# Patient Record
Sex: Female | Born: 1960 | Race: White | Hispanic: No | Marital: Married
Health system: Midwestern US, Community
[De-identification: ages and names within clinical notes are randomized; demographics above are authoritative.]

## PROBLEM LIST (undated history)

## (undated) ENCOUNTER — Ambulatory Visit (HOSPITAL_BASED_OUTPATIENT_CLINIC_OR_DEPARTMENT_OTHER)

---
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
## 1989-09-04 DIAGNOSIS — G35 Multiple sclerosis: Secondary | ICD-10-CM

## 1989-09-04 HISTORY — DX: Multiple sclerosis: G35

## 1997-12-03 ENCOUNTER — Encounter: Admission: RE | Admit: 1997-12-03 | Discharge: 1998-03-03 | Payer: Self-pay | Admitting: Neurosurgery

## 1998-06-21 ENCOUNTER — Other Ambulatory Visit: Admission: RE | Admit: 1998-06-21 | Discharge: 1998-06-21 | Payer: Self-pay | Admitting: Obstetrics and Gynecology

## 1999-06-26 ENCOUNTER — Encounter: Payer: Self-pay | Admitting: Neurosurgery

## 1999-06-26 ENCOUNTER — Ambulatory Visit (HOSPITAL_COMMUNITY): Admission: RE | Admit: 1999-06-26 | Discharge: 1999-06-26 | Payer: Self-pay | Admitting: Neurosurgery

## 2000-01-08 ENCOUNTER — Encounter: Payer: Self-pay | Admitting: Neurosurgery

## 2000-01-08 ENCOUNTER — Ambulatory Visit (HOSPITAL_COMMUNITY): Admission: RE | Admit: 2000-01-08 | Discharge: 2000-01-08 | Payer: Self-pay | Admitting: Neurosurgery

## 2000-01-24 ENCOUNTER — Encounter: Payer: Self-pay | Admitting: Neurosurgery

## 2000-01-26 ENCOUNTER — Encounter: Payer: Self-pay | Admitting: Neurosurgery

## 2000-01-26 ENCOUNTER — Inpatient Hospital Stay (HOSPITAL_COMMUNITY): Admission: RE | Admit: 2000-01-26 | Discharge: 2000-01-28 | Payer: Self-pay | Admitting: Neurosurgery

## 2000-02-02 ENCOUNTER — Encounter: Admission: RE | Admit: 2000-02-02 | Discharge: 2000-02-02 | Payer: Self-pay | Admitting: Neurosurgery

## 2000-02-02 ENCOUNTER — Encounter: Payer: Self-pay | Admitting: Neurosurgery

## 2000-03-21 ENCOUNTER — Encounter: Payer: Self-pay | Admitting: Neurosurgery

## 2000-03-21 ENCOUNTER — Encounter: Admission: RE | Admit: 2000-03-21 | Discharge: 2000-03-21 | Payer: Self-pay | Admitting: Neurosurgery

## 2000-06-06 ENCOUNTER — Encounter: Admission: RE | Admit: 2000-06-06 | Discharge: 2000-06-06 | Payer: Self-pay | Admitting: Neurosurgery

## 2000-06-06 ENCOUNTER — Encounter: Payer: Self-pay | Admitting: Neurosurgery

## 2000-09-27 ENCOUNTER — Other Ambulatory Visit: Admission: RE | Admit: 2000-09-27 | Discharge: 2000-09-27 | Payer: Self-pay | Admitting: Obstetrics and Gynecology

## 2000-10-03 ENCOUNTER — Encounter: Payer: Self-pay | Admitting: Obstetrics and Gynecology

## 2000-10-03 ENCOUNTER — Encounter: Admission: RE | Admit: 2000-10-03 | Discharge: 2000-10-03 | Payer: Self-pay | Admitting: Obstetrics and Gynecology

## 2001-12-05 ENCOUNTER — Other Ambulatory Visit: Admission: RE | Admit: 2001-12-05 | Discharge: 2001-12-05 | Payer: Self-pay | Admitting: Obstetrics and Gynecology

## 2003-07-29 ENCOUNTER — Other Ambulatory Visit: Admission: RE | Admit: 2003-07-29 | Discharge: 2003-07-29 | Payer: Self-pay | Admitting: Obstetrics and Gynecology

## 2004-09-06 ENCOUNTER — Other Ambulatory Visit: Admission: RE | Admit: 2004-09-06 | Discharge: 2004-09-06 | Payer: Self-pay | Admitting: Obstetrics and Gynecology

## 2006-05-17 ENCOUNTER — Encounter: Admission: RE | Admit: 2006-05-17 | Discharge: 2006-05-17 | Payer: Self-pay | Admitting: Obstetrics and Gynecology

## 2007-05-23 ENCOUNTER — Encounter: Admission: RE | Admit: 2007-05-23 | Discharge: 2007-05-23 | Payer: Self-pay | Admitting: Obstetrics and Gynecology

## 2008-09-29 ENCOUNTER — Encounter: Admission: RE | Admit: 2008-09-29 | Discharge: 2008-09-29 | Payer: Self-pay | Admitting: Obstetrics and Gynecology

## 2009-03-19 ENCOUNTER — Encounter: Admission: RE | Admit: 2009-03-19 | Discharge: 2009-03-19 | Payer: Self-pay | Admitting: Obstetrics and Gynecology

## 2011-01-20 NOTE — Op Note (Signed)
[place]. [facility]  Patient:    Christina Cantrell, Christina Cantrell                     MRN: [id number] Proc. Date: 01/26/00 Adm. Date:  [id number] Disc. 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  OPERATION:  Anterior cervical diskectomy at C5-6 and C6-7 levels with allograft bone grafting and intracervical plating  SURGEON:  Danae Orleans. Venetia Maxon, M.D.  ASSISTANT:  Alanson Aly. Roxan Hockey, M.D.  ANESTHESIA:  General endotracheal anesthesia.  ESTIMATED BLOOD LOSS:  100 cc.  COMPLICATIONS:  None.  DISPOSITION:  Recovery.  INDICATIONS:  Christina Cantrell is a 50 year old woman with neck pain and right upper extremity radiculopathy with herniated disk at C5-6 and C6-7 levels.  It was elected to take her to surgery for anterior cervical diskectomy and fusion.  DESCRIPTION OF PROCEDURE:  Ms. Wax is brought to the operating room. Following satisfactory and uncomplicated induction of general endotracheal anesthesia, placement of intravenous lines, she was placed in supine position on the operating table.  Her neck was placed in extension.  She was placed under 10 pounds of Holter traction.  Her anterior neck was then prepped and draped in the usual sterile fashion.  The area of planned incision was infiltrated with 0.25% Marcaine, 0.50% lidocaine, 1:200,000 epinephrine.  The incision was made in transverse fashion in one of the lower neck creases overlying the carotid tubercle from midline to the anterior border of sternocleidomastoid muscle.  This incision was carried sharply through platysmal layer.  Subplatysmal dissection was performed.  The blunt dissection was run along  the anterior border of the sternocleidomastoid muscle, keeping carotid sheath lateral and trachea and esophagus medial, exposing the anterior cervical spine.  Spinal needle was placed at what was felt to be the C5-6 level, and this was confirmed on intraoperative x-ray.  The longus colli muscles were then taken down from the anterior cervical spine from C5 through C7 levels bilaterally, and self-retaining shadow line retractor was placed with up-down retractor as well.  Disk spaces at C5-6 and C6-7 were incised with a #15 blade and disk material removed in a piecemeal fashion.  There was significant disk degeneration and ventral osteophytes at C5-6 and to a lesser degree at the C6-7 level.  Disk space spreaders were placed, and microscope was brought into the field initially at the C5-6 level.  The end plates of C5 and C6 were decorticated with Midas Rex drill and A2 burr.  The uncinate spurs were drilled down bilaterally.  The posterior longitudinal ligament was then incised with arachnoid knife, and disk material and ligamentous material were removed in piecemeal fashion using a variety of 2 and 3 mm Kerrison rongeurs. The C6 nerve roots were widely decompressed, particularly on the right side. There was evidence of disk material directly overlying the nerve root at this level, and a rent in the posterior longitudinal ligament was identified on exposure.  After decompressing the nerve roots bilaterally, hemostasis was assured with Gelfoam soaked in thrombin.  A iliac crest allograft wedge was then cut to a thickness of 7 mm and a depth of 13 mm  and was placed in the interspace and countersunk appropriately.  Attention was then turned to the C6-7 level where similar decompression was performed.  Again, the C7 nerve roots were widely decompressed, particularly on the right.  Uncinate spurs were removed initially with the Midas Rex drill and subsequently with Kerrison rongeurs.  Hemostasis  was again assured with Gelfoam soaked in thrombin, and a similarly sized iliac crest allograft bone wedge was placed.  Ventral osteophytes were removed.  The up-down retractors were then taken out.  Microscope was taken out of the field.  A 45 mm Lantus anterior cervical plate was sized, found to fit appropriately overlying the anterior cervical spine and was affixed to the anterior cervical spine with two 13 x 4 mm fixed angle screws at C5, at C6, and at C7.  All screws had excellent purchase.  Locking mechanisms were engaged.  Final x-ray confirmed positioning of bone graft and anterior cervical plate at the Z6-X0 level.  It was elected not to pull down on the patients shoulders to visualize the C6-7 level because of intrinsic shoulder pathology.  The wound was copiously irrigated with bacitracin and saline.  Platysmal areas were approximated with 3-0 Vicryl interrupted inverted sutures.  Skin edges were reapproximated with running 4-0 Vicryl subcuticular stitch.  The wound was dressed with Benzoin and Steri-Strips and Telfa tape.  The patient was placed in a collar and extubated in the operating room and taken to the recovery room in stable and satisfactory condition having tolerated the operation well.  Counts were correct at the end of the case. DD:  01/26/00 TD:  01/30/00 Job: [id number]

## 2011-01-20 NOTE — Discharge Summary (Signed)
[place]. [facility]  Patient:    Christina Cantrell, Christina Cantrell                     MRN: [id number] Adm. Date:  [id number] Disc. 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  HISTORY OF PRESENT ILLNESS AND HOSPITAL COURSE:  Christina Cantrell is a 50 year old woman with cervical spondylosis and herniated disk at C5-6 and c67 levels with right C6 and C7 radiculopathies.  It was elected to take her to surgery for anterior cervical diskectomy and fusion.  This was done on Jan 26, 2000.  She underwent uncomplicated surgery.  She did well postoperatively. Was having some persistent neck pain and required parenteral narcotics.  Was started on OxyContin for pain control.  Was done well on Jan 28, 2000, and was discharged home in stable and satisfactory condition, having tolerated the operation and hospitalization well.  DISCHARGE MEDICATIONS: 1. OxyContin 20 mg b.i.d. 2. Percocet 5/325 1 every four hours as needed for pain. 3. Valium 5 mg up to every six hours as needed for spasm.  DISCHARGE INSTRUCTIONS:  No lifting or pulling with her arms, bending, twisting, driving.  Wear a collar.  FOLLOW-UP:  With Dr. Venetia Maxon in two weeks.  Call Tuesday for an appointment with lateral C-spine x-rays. DD:  01/28/00 TD:  01/30/00 Job: [id number]

## 2011-01-20 NOTE — H&P (Signed)
[place]. [facility]  Patient:    Christina Cantrell, Christina Cantrell                     MRN: [id number] Adm. Date:  [id number] Attending:  Josie Saunders Dictator:   Danae Orleans. Venetia Maxon, M.D.                         History and Physical  REASON FOR ADMISSION:  Herniated cervical discs, C5-6 and C6-7 levels with right C6 and C7 radiculopathies.  HISTORY OF PRESENT ILLNESS:  Christina Cantrell is an established patient of mine who has previously undergone lumbar surgery.  She underwent lumbar surgery with RAY cages in 05/15/97.  She was stable from that, but began to complain in the fall of 2000 of significant right upper extremity pain.  She was found to have a herniated disc at C5-6, and a foraminal disc herniation at C6-7 on an MRI on the right.  She also had an MRI of her shoulder and was found to have some tendinopathy.  Dr. Cleophas Dunker evaluated her, and she underwent extensive physical therapy.  She continued to complain of neck pain and arm pain, and had a positive Spurlings maneuver to the right.  Multiple efforts at conservative management have been made without significant relief.  She tried home traction, and continued to have significant neck and right upper extremity pain and numbness.  It was elected to repeat her MRI this May, and this was of poor technical quality, but nonetheless demonstrated persistent disc herniations at C5-6 and C6-7 levels.  Based on her protracted neck and right upper extremity pain with biceps and triceps weakness it was elected to take her to surgery for anterior cervical diskectomy and fusion.  I discussed at length with Mr. and Christina Cantrell the planned surgical procedure which was planned for 01/26/00.  This will consist of anterior cervical diskectomy and fusion at C5-6 and C6-7 levels with allograft bone grafting and anterior cervical plating.  I went over the diagnostic studies with them in detail, and reviewed surgical models, and also  discussed the exact nature of the surgical procedure, the tentative risk potential, benefits, and typical operative and postoperative course.  I discussed the risks of surgery, which include, but are not limited to, risks of anesthesia, blood loss, infection, injury to various neck structures, including trachea and esophagus which could cause either temporary or permanent swallowing difficulties, and also the potential of perforation of the esophagus which might require operative intervention, larynx, recurrent laryngeal nerve, which could cause either temporary or permanent vocal cord paralysis resulting in either temporary or permanent voice changes, injury to cervical nerve roots which could cause either temporary or permanent arm pain, numbness, and/or weakness.  There is a small chance of injury to the spinal cord which could cause paralysis.  There is also the potential for malplacement of instrumentation, fusion failure, need for repeat surgery, degenerative disease at levels in the neck, failure to relieve her neck pain, worsening of pain.  I discussed that the patient will lose some mobility in her neck with this surgery.  It is typical to stay in the hospital overnight after the operation.  Typically, she will not be able to drive for two weeks after surgery.  Will come back to see me two weeks after surgery with lateral C-spine x-ray and for monthly visit up to three months after surgery.  She is to wear a collar after  the surgery.  PAST MEDICAL HISTORY: 1. A clinical diagnosis of MS. 2. 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  She has    been evaluated by ophthalmologist and also by Dr. Noreene Filbert from neurology. 3. Two lumbar spine surgeries.  CURRENT MEDICATIONS: 1. Amitriptyline 25 mg q.h.s. 2. Skelaxin 400 mg q.6h. p.r.n. 3. Hydrocodone/APAP 10/650 one q.6h. p.r.n. pain.  ALLERGIES:  No known drug allergies.  IMPRESSION AND RECOMMENDATIONS:  As  above.  Christina Cantrell is a 50 year old woman with right C6-7 radiculopathy, and herniated discs at C5-6 and C6-7 levels.  It was elected to take her to surgery for anterior cervical diskectomy and fusion. DD:  01/26/00 TD:  01/26/00 Job: [id number]

## 2011-10-18 ENCOUNTER — Other Ambulatory Visit: Payer: Self-pay | Admitting: Obstetrics and Gynecology

## 2011-10-18 DIAGNOSIS — N63 Unspecified lump in unspecified breast: Secondary | ICD-10-CM

## 2011-10-19 ENCOUNTER — Other Ambulatory Visit: Payer: Self-pay

## 2011-10-25 ENCOUNTER — Ambulatory Visit
Admission: RE | Admit: 2011-10-25 | Discharge: 2011-10-25 | Disposition: A | Discharge status: Home / Self Care | Payer: BC Managed Care – PPO | Source: Ambulatory Visit | Attending: Obstetrics and Gynecology | Admitting: Obstetrics and Gynecology

## 2011-10-25 DIAGNOSIS — N63 Unspecified lump in unspecified breast: Secondary | ICD-10-CM

## 2012-08-23 ENCOUNTER — Other Ambulatory Visit: Payer: Self-pay | Admitting: Obstetrics and Gynecology

## 2012-08-23 DIAGNOSIS — N6452 Nipple discharge: Secondary | ICD-10-CM

## 2012-08-23 DIAGNOSIS — N63 Unspecified lump in unspecified breast: Secondary | ICD-10-CM

## 2012-09-03 ENCOUNTER — Ambulatory Visit
Admission: RE | Admit: 2012-09-03 | Discharge: 2012-09-03 | Disposition: A | Discharge status: Home / Self Care | Payer: BC Managed Care – PPO | Source: Ambulatory Visit | Attending: Obstetrics and Gynecology | Admitting: Obstetrics and Gynecology

## 2012-09-03 DIAGNOSIS — N6452 Nipple discharge: Secondary | ICD-10-CM

## 2012-09-03 DIAGNOSIS — N63 Unspecified lump in unspecified breast: Secondary | ICD-10-CM

## 2013-06-04 DIAGNOSIS — M5136 Other intervertebral disc degeneration, lumbar region: Secondary | ICD-10-CM | POA: Insufficient documentation

## 2013-06-04 DIAGNOSIS — M546 Pain in thoracic spine: Secondary | ICD-10-CM

## 2013-06-04 DIAGNOSIS — G8929 Other chronic pain: Secondary | ICD-10-CM

## 2013-06-04 DIAGNOSIS — G35 Multiple sclerosis: Secondary | ICD-10-CM | POA: Insufficient documentation

## 2013-06-04 DIAGNOSIS — M51369 Other intervertebral disc degeneration, lumbar region without mention of lumbar back pain or lower extremity pain: Secondary | ICD-10-CM

## 2013-06-04 DIAGNOSIS — M545 Low back pain, unspecified: Secondary | ICD-10-CM | POA: Insufficient documentation

## 2013-06-04 HISTORY — DX: Other chronic pain: G89.29

## 2013-06-04 HISTORY — DX: Pain in thoracic spine: M54.6

## 2013-06-04 HISTORY — DX: Other intervertebral disc degeneration, lumbar region: M51.36

## 2013-06-04 HISTORY — DX: Other intervertebral disc degeneration, lumbar region without mention of lumbar back pain or lower extremity pain: M51.369

## 2014-09-18 DIAGNOSIS — J0100 Acute maxillary sinusitis, unspecified: Secondary | ICD-10-CM | POA: Diagnosis not present

## 2014-09-24 DIAGNOSIS — J45901 Unspecified asthma with (acute) exacerbation: Secondary | ICD-10-CM | POA: Diagnosis not present

## 2014-09-24 DIAGNOSIS — J019 Acute sinusitis, unspecified: Secondary | ICD-10-CM | POA: Diagnosis not present

## 2014-11-09 DIAGNOSIS — R5382 Chronic fatigue, unspecified: Secondary | ICD-10-CM | POA: Diagnosis not present

## 2014-11-09 DIAGNOSIS — R202 Paresthesia of skin: Secondary | ICD-10-CM | POA: Diagnosis not present

## 2014-11-12 DIAGNOSIS — M542 Cervicalgia: Secondary | ICD-10-CM | POA: Diagnosis not present

## 2014-11-12 DIAGNOSIS — M5136 Other intervertebral disc degeneration, lumbar region: Secondary | ICD-10-CM | POA: Diagnosis not present

## 2014-11-12 DIAGNOSIS — G35 Multiple sclerosis: Secondary | ICD-10-CM | POA: Diagnosis not present

## 2014-11-12 DIAGNOSIS — M545 Low back pain: Secondary | ICD-10-CM | POA: Diagnosis not present

## 2014-12-22 DIAGNOSIS — R072 Precordial pain: Secondary | ICD-10-CM | POA: Diagnosis not present

## 2014-12-22 DIAGNOSIS — R0602 Shortness of breath: Secondary | ICD-10-CM | POA: Diagnosis not present

## 2014-12-22 DIAGNOSIS — R079 Chest pain, unspecified: Secondary | ICD-10-CM | POA: Diagnosis not present

## 2014-12-31 DIAGNOSIS — R935 Abnormal findings on diagnostic imaging of other abdominal regions, including retroperitoneum: Secondary | ICD-10-CM | POA: Diagnosis not present

## 2014-12-31 DIAGNOSIS — I712 Thoracic aortic aneurysm, without rupture: Secondary | ICD-10-CM | POA: Diagnosis not present

## 2015-01-26 DIAGNOSIS — F17200 Nicotine dependence, unspecified, uncomplicated: Secondary | ICD-10-CM | POA: Diagnosis not present

## 2015-01-26 DIAGNOSIS — G35 Multiple sclerosis: Secondary | ICD-10-CM | POA: Diagnosis not present

## 2015-01-26 DIAGNOSIS — R0789 Other chest pain: Secondary | ICD-10-CM | POA: Diagnosis not present

## 2015-01-26 DIAGNOSIS — Z13220 Encounter for screening for lipoid disorders: Secondary | ICD-10-CM | POA: Diagnosis not present

## 2015-01-26 DIAGNOSIS — R0600 Dyspnea, unspecified: Secondary | ICD-10-CM | POA: Diagnosis not present

## 2015-02-04 DIAGNOSIS — M542 Cervicalgia: Secondary | ICD-10-CM | POA: Diagnosis not present

## 2015-02-04 DIAGNOSIS — M545 Low back pain: Secondary | ICD-10-CM | POA: Diagnosis not present

## 2015-02-04 DIAGNOSIS — G35 Multiple sclerosis: Secondary | ICD-10-CM | POA: Diagnosis not present

## 2015-02-04 DIAGNOSIS — M5136 Other intervertebral disc degeneration, lumbar region: Secondary | ICD-10-CM | POA: Diagnosis not present

## 2015-04-27 DIAGNOSIS — M545 Low back pain: Secondary | ICD-10-CM | POA: Diagnosis not present

## 2015-04-27 DIAGNOSIS — M542 Cervicalgia: Secondary | ICD-10-CM | POA: Diagnosis not present

## 2015-04-27 DIAGNOSIS — G35 Multiple sclerosis: Secondary | ICD-10-CM | POA: Diagnosis not present

## 2015-05-20 DIAGNOSIS — M7989 Other specified soft tissue disorders: Secondary | ICD-10-CM | POA: Diagnosis not present

## 2015-05-20 DIAGNOSIS — M25572 Pain in left ankle and joints of left foot: Secondary | ICD-10-CM | POA: Diagnosis not present

## 2015-05-20 DIAGNOSIS — M79609 Pain in unspecified limb: Secondary | ICD-10-CM | POA: Diagnosis not present

## 2015-05-20 DIAGNOSIS — M79672 Pain in left foot: Secondary | ICD-10-CM | POA: Diagnosis not present

## 2015-06-28 DIAGNOSIS — Z01419 Encounter for gynecological examination (general) (routine) without abnormal findings: Secondary | ICD-10-CM | POA: Diagnosis not present

## 2015-06-28 DIAGNOSIS — Z124 Encounter for screening for malignant neoplasm of cervix: Secondary | ICD-10-CM | POA: Diagnosis not present

## 2015-06-28 DIAGNOSIS — Z1231 Encounter for screening mammogram for malignant neoplasm of breast: Secondary | ICD-10-CM | POA: Diagnosis not present

## 2015-07-27 DIAGNOSIS — M542 Cervicalgia: Secondary | ICD-10-CM | POA: Diagnosis not present

## 2015-07-27 DIAGNOSIS — G35 Multiple sclerosis: Secondary | ICD-10-CM | POA: Diagnosis not present

## 2015-07-27 DIAGNOSIS — M5136 Other intervertebral disc degeneration, lumbar region: Secondary | ICD-10-CM | POA: Diagnosis not present

## 2015-07-27 DIAGNOSIS — M545 Low back pain: Secondary | ICD-10-CM | POA: Diagnosis not present

## 2015-08-12 DIAGNOSIS — H547 Unspecified visual loss: Secondary | ICD-10-CM | POA: Diagnosis not present

## 2015-08-12 DIAGNOSIS — H5203 Hypermetropia, bilateral: Secondary | ICD-10-CM | POA: Diagnosis not present

## 2015-08-12 DIAGNOSIS — H04123 Dry eye syndrome of bilateral lacrimal glands: Secondary | ICD-10-CM | POA: Diagnosis not present

## 2015-08-12 DIAGNOSIS — G35 Multiple sclerosis: Secondary | ICD-10-CM | POA: Diagnosis not present

## 2015-09-14 DIAGNOSIS — G35 Multiple sclerosis: Secondary | ICD-10-CM | POA: Diagnosis not present

## 2015-10-25 DIAGNOSIS — K921 Melena: Secondary | ICD-10-CM | POA: Diagnosis not present

## 2015-10-25 DIAGNOSIS — R140 Abdominal distension (gaseous): Secondary | ICD-10-CM | POA: Diagnosis not present

## 2015-10-25 DIAGNOSIS — K5900 Constipation, unspecified: Secondary | ICD-10-CM | POA: Diagnosis not present

## 2015-10-28 DIAGNOSIS — M542 Cervicalgia: Secondary | ICD-10-CM | POA: Diagnosis not present

## 2015-10-28 DIAGNOSIS — M5136 Other intervertebral disc degeneration, lumbar region: Secondary | ICD-10-CM | POA: Diagnosis not present

## 2015-10-28 DIAGNOSIS — M545 Low back pain: Secondary | ICD-10-CM | POA: Diagnosis not present

## 2015-10-28 DIAGNOSIS — G35 Multiple sclerosis: Secondary | ICD-10-CM | POA: Diagnosis not present

## 2015-11-02 DIAGNOSIS — Z79899 Other long term (current) drug therapy: Secondary | ICD-10-CM | POA: Diagnosis not present

## 2015-11-02 DIAGNOSIS — G35 Multiple sclerosis: Secondary | ICD-10-CM | POA: Diagnosis not present

## 2015-11-02 DIAGNOSIS — Z1211 Encounter for screening for malignant neoplasm of colon: Secondary | ICD-10-CM | POA: Diagnosis not present

## 2015-11-02 DIAGNOSIS — K5730 Diverticulosis of large intestine without perforation or abscess without bleeding: Secondary | ICD-10-CM | POA: Diagnosis not present

## 2015-11-02 DIAGNOSIS — Z87891 Personal history of nicotine dependence: Secondary | ICD-10-CM | POA: Diagnosis not present

## 2015-11-02 DIAGNOSIS — G4730 Sleep apnea, unspecified: Secondary | ICD-10-CM | POA: Diagnosis not present

## 2015-12-09 DIAGNOSIS — K5900 Constipation, unspecified: Secondary | ICD-10-CM | POA: Diagnosis not present

## 2015-12-09 DIAGNOSIS — R1032 Left lower quadrant pain: Secondary | ICD-10-CM | POA: Diagnosis not present

## 2015-12-09 DIAGNOSIS — K5730 Diverticulosis of large intestine without perforation or abscess without bleeding: Secondary | ICD-10-CM | POA: Diagnosis not present

## 2016-01-03 LAB — HM COLONOSCOPY

## 2016-01-05 DIAGNOSIS — L219 Seborrheic dermatitis, unspecified: Secondary | ICD-10-CM | POA: Diagnosis not present

## 2016-01-05 DIAGNOSIS — L299 Pruritus, unspecified: Secondary | ICD-10-CM | POA: Diagnosis not present

## 2016-01-05 DIAGNOSIS — L304 Erythema intertrigo: Secondary | ICD-10-CM | POA: Diagnosis not present

## 2016-01-10 DIAGNOSIS — N23 Unspecified renal colic: Secondary | ICD-10-CM | POA: Diagnosis not present

## 2016-01-10 DIAGNOSIS — M545 Low back pain: Secondary | ICD-10-CM | POA: Diagnosis not present

## 2016-01-11 DIAGNOSIS — M545 Low back pain: Secondary | ICD-10-CM | POA: Diagnosis not present

## 2016-01-11 DIAGNOSIS — G35 Multiple sclerosis: Secondary | ICD-10-CM | POA: Diagnosis not present

## 2016-01-11 DIAGNOSIS — M542 Cervicalgia: Secondary | ICD-10-CM | POA: Diagnosis not present

## 2016-01-11 DIAGNOSIS — M5136 Other intervertebral disc degeneration, lumbar region: Secondary | ICD-10-CM | POA: Diagnosis not present

## 2016-01-12 DIAGNOSIS — R109 Unspecified abdominal pain: Secondary | ICD-10-CM | POA: Diagnosis not present

## 2016-01-12 DIAGNOSIS — M545 Low back pain: Secondary | ICD-10-CM | POA: Diagnosis not present

## 2016-01-12 DIAGNOSIS — N200 Calculus of kidney: Secondary | ICD-10-CM | POA: Diagnosis not present

## 2016-01-12 DIAGNOSIS — Z87442 Personal history of urinary calculi: Secondary | ICD-10-CM | POA: Diagnosis not present

## 2016-01-26 DIAGNOSIS — R1032 Left lower quadrant pain: Secondary | ICD-10-CM | POA: Diagnosis not present

## 2016-01-26 DIAGNOSIS — N200 Calculus of kidney: Secondary | ICD-10-CM | POA: Diagnosis not present

## 2016-01-26 DIAGNOSIS — R3129 Other microscopic hematuria: Secondary | ICD-10-CM | POA: Diagnosis not present

## 2016-04-13 DIAGNOSIS — M545 Low back pain: Secondary | ICD-10-CM | POA: Diagnosis not present

## 2016-04-13 DIAGNOSIS — M5136 Other intervertebral disc degeneration, lumbar region: Secondary | ICD-10-CM | POA: Diagnosis not present

## 2016-04-13 DIAGNOSIS — G35 Multiple sclerosis: Secondary | ICD-10-CM | POA: Diagnosis not present

## 2016-04-13 DIAGNOSIS — M542 Cervicalgia: Secondary | ICD-10-CM | POA: Diagnosis not present

## 2016-05-10 DIAGNOSIS — Z23 Encounter for immunization: Secondary | ICD-10-CM | POA: Diagnosis not present

## 2016-05-10 DIAGNOSIS — R1084 Generalized abdominal pain: Secondary | ICD-10-CM | POA: Diagnosis not present

## 2016-05-10 DIAGNOSIS — R079 Chest pain, unspecified: Secondary | ICD-10-CM | POA: Diagnosis not present

## 2016-05-10 DIAGNOSIS — J449 Chronic obstructive pulmonary disease, unspecified: Secondary | ICD-10-CM | POA: Diagnosis not present

## 2016-05-10 DIAGNOSIS — R0789 Other chest pain: Secondary | ICD-10-CM | POA: Diagnosis not present

## 2016-05-10 DIAGNOSIS — E782 Mixed hyperlipidemia: Secondary | ICD-10-CM | POA: Diagnosis not present

## 2016-05-10 DIAGNOSIS — R5383 Other fatigue: Secondary | ICD-10-CM | POA: Diagnosis not present

## 2016-06-12 ENCOUNTER — Other Ambulatory Visit: Payer: Self-pay | Admitting: Pharmacist

## 2016-06-12 NOTE — Patient Outreach (Signed)
Outreach call to Christina Cantrell regarding her request for follow up from the [facility] Medication Adherence Campaign. Called and spoke with patient. HIPAA identifiers verified and verbal consent received.  Christina Cantrell reports that her atorvastatin dose has been increased to 20 mg daily. Reports that she takes her medication as directed. Reports that a couple of months ago, she had been having trouble with missing doses. Reports that she would fall asleep before taking her medication. However, reports that her husband has started helping to remember her medication at nighttime. Denies any missed doses since he started helping her to manage this. Denies barriers to adherence such as side effects or cost.   Patient reports that she has no medication questions or concerns at this time.  Harlow Asa, PharmD Clinical Pharmacist [facility] [phone]

## 2016-07-05 DIAGNOSIS — M542 Cervicalgia: Secondary | ICD-10-CM | POA: Diagnosis not present

## 2016-07-05 DIAGNOSIS — M545 Low back pain: Secondary | ICD-10-CM | POA: Diagnosis not present

## 2016-07-05 DIAGNOSIS — G35 Multiple sclerosis: Secondary | ICD-10-CM | POA: Diagnosis not present

## 2016-09-26 DIAGNOSIS — R030 Elevated blood-pressure reading, without diagnosis of hypertension: Secondary | ICD-10-CM | POA: Diagnosis not present

## 2016-09-26 DIAGNOSIS — M542 Cervicalgia: Secondary | ICD-10-CM | POA: Diagnosis not present

## 2016-09-26 DIAGNOSIS — M545 Low back pain: Secondary | ICD-10-CM | POA: Diagnosis not present

## 2016-09-26 DIAGNOSIS — G35 Multiple sclerosis: Secondary | ICD-10-CM | POA: Diagnosis not present

## 2017-01-31 DIAGNOSIS — R030 Elevated blood-pressure reading, without diagnosis of hypertension: Secondary | ICD-10-CM | POA: Diagnosis not present

## 2017-01-31 DIAGNOSIS — M545 Low back pain: Secondary | ICD-10-CM | POA: Diagnosis not present

## 2017-01-31 DIAGNOSIS — G35 Multiple sclerosis: Secondary | ICD-10-CM | POA: Diagnosis not present

## 2017-01-31 DIAGNOSIS — M542 Cervicalgia: Secondary | ICD-10-CM | POA: Diagnosis not present

## 2017-02-01 DIAGNOSIS — R51 Headache: Secondary | ICD-10-CM | POA: Diagnosis not present

## 2017-02-01 DIAGNOSIS — J449 Chronic obstructive pulmonary disease, unspecified: Secondary | ICD-10-CM | POA: Diagnosis not present

## 2017-02-01 DIAGNOSIS — R0602 Shortness of breath: Secondary | ICD-10-CM | POA: Diagnosis not present

## 2017-02-01 DIAGNOSIS — G4730 Sleep apnea, unspecified: Secondary | ICD-10-CM | POA: Diagnosis not present

## 2017-02-01 DIAGNOSIS — H469 Unspecified optic neuritis: Secondary | ICD-10-CM | POA: Diagnosis not present

## 2017-02-01 DIAGNOSIS — H538 Other visual disturbances: Secondary | ICD-10-CM | POA: Diagnosis not present

## 2017-02-01 DIAGNOSIS — G35 Multiple sclerosis: Secondary | ICD-10-CM | POA: Diagnosis not present

## 2017-02-01 DIAGNOSIS — I10 Essential (primary) hypertension: Secondary | ICD-10-CM | POA: Diagnosis not present

## 2017-02-01 DIAGNOSIS — R0600 Dyspnea, unspecified: Secondary | ICD-10-CM | POA: Diagnosis not present

## 2017-02-26 DIAGNOSIS — H04123 Dry eye syndrome of bilateral lacrimal glands: Secondary | ICD-10-CM | POA: Diagnosis not present

## 2017-02-26 DIAGNOSIS — G35 Multiple sclerosis: Secondary | ICD-10-CM | POA: Diagnosis not present

## 2017-02-26 DIAGNOSIS — Z8669 Personal history of other diseases of the nervous system and sense organs: Secondary | ICD-10-CM | POA: Diagnosis not present

## 2017-02-26 DIAGNOSIS — H2513 Age-related nuclear cataract, bilateral: Secondary | ICD-10-CM | POA: Diagnosis not present

## 2017-02-26 DIAGNOSIS — H524 Presbyopia: Secondary | ICD-10-CM | POA: Diagnosis not present

## 2017-03-22 DIAGNOSIS — L700 Acne vulgaris: Secondary | ICD-10-CM | POA: Diagnosis not present

## 2017-03-22 DIAGNOSIS — L300 Nummular dermatitis: Secondary | ICD-10-CM | POA: Diagnosis not present

## 2017-05-01 DIAGNOSIS — M545 Low back pain: Secondary | ICD-10-CM | POA: Diagnosis not present

## 2017-05-01 DIAGNOSIS — G35 Multiple sclerosis: Secondary | ICD-10-CM | POA: Diagnosis not present

## 2017-05-01 DIAGNOSIS — M542 Cervicalgia: Secondary | ICD-10-CM | POA: Diagnosis not present

## 2017-07-23 DIAGNOSIS — M545 Low back pain: Secondary | ICD-10-CM | POA: Diagnosis not present

## 2017-07-23 DIAGNOSIS — M542 Cervicalgia: Secondary | ICD-10-CM | POA: Diagnosis not present

## 2017-07-23 DIAGNOSIS — G35 Multiple sclerosis: Secondary | ICD-10-CM | POA: Diagnosis not present

## 2017-10-17 DIAGNOSIS — M542 Cervicalgia: Secondary | ICD-10-CM | POA: Diagnosis not present

## 2017-10-17 DIAGNOSIS — G35 Multiple sclerosis: Secondary | ICD-10-CM | POA: Diagnosis not present

## 2017-10-17 DIAGNOSIS — R030 Elevated blood-pressure reading, without diagnosis of hypertension: Secondary | ICD-10-CM | POA: Diagnosis not present

## 2017-10-17 DIAGNOSIS — M545 Low back pain: Secondary | ICD-10-CM | POA: Diagnosis not present

## 2017-11-12 DIAGNOSIS — M792 Neuralgia and neuritis, unspecified: Secondary | ICD-10-CM | POA: Insufficient documentation

## 2017-11-12 DIAGNOSIS — G35 Multiple sclerosis: Secondary | ICD-10-CM | POA: Diagnosis not present

## 2017-11-12 DIAGNOSIS — M545 Low back pain: Secondary | ICD-10-CM | POA: Diagnosis not present

## 2017-11-12 HISTORY — DX: Neuralgia and neuritis, unspecified: M79.2

## 2017-11-19 DIAGNOSIS — M545 Low back pain: Secondary | ICD-10-CM | POA: Diagnosis not present

## 2017-11-19 DIAGNOSIS — G35 Multiple sclerosis: Secondary | ICD-10-CM | POA: Diagnosis not present

## 2017-11-19 DIAGNOSIS — M792 Neuralgia and neuritis, unspecified: Secondary | ICD-10-CM | POA: Diagnosis not present

## 2017-11-26 DIAGNOSIS — M545 Low back pain: Secondary | ICD-10-CM | POA: Diagnosis not present

## 2017-11-26 DIAGNOSIS — M792 Neuralgia and neuritis, unspecified: Secondary | ICD-10-CM | POA: Diagnosis not present

## 2018-01-16 DIAGNOSIS — M545 Low back pain: Secondary | ICD-10-CM | POA: Diagnosis not present

## 2018-01-16 DIAGNOSIS — M542 Cervicalgia: Secondary | ICD-10-CM | POA: Diagnosis not present

## 2018-01-16 DIAGNOSIS — M792 Neuralgia and neuritis, unspecified: Secondary | ICD-10-CM | POA: Diagnosis not present

## 2018-01-16 DIAGNOSIS — G35 Multiple sclerosis: Secondary | ICD-10-CM | POA: Diagnosis not present

## 2018-01-21 DIAGNOSIS — H2513 Age-related nuclear cataract, bilateral: Secondary | ICD-10-CM | POA: Diagnosis not present

## 2018-02-18 DIAGNOSIS — H52211 Irregular astigmatism, right eye: Secondary | ICD-10-CM | POA: Diagnosis not present

## 2018-02-18 DIAGNOSIS — H2511 Age-related nuclear cataract, right eye: Secondary | ICD-10-CM | POA: Diagnosis not present

## 2018-02-18 DIAGNOSIS — Z01818 Encounter for other preprocedural examination: Secondary | ICD-10-CM | POA: Diagnosis not present

## 2018-02-22 DIAGNOSIS — H25811 Combined forms of age-related cataract, right eye: Secondary | ICD-10-CM | POA: Diagnosis not present

## 2018-02-22 DIAGNOSIS — H2511 Age-related nuclear cataract, right eye: Secondary | ICD-10-CM | POA: Diagnosis not present

## 2018-03-22 DIAGNOSIS — H25812 Combined forms of age-related cataract, left eye: Secondary | ICD-10-CM | POA: Diagnosis not present

## 2018-03-22 DIAGNOSIS — H2512 Age-related nuclear cataract, left eye: Secondary | ICD-10-CM | POA: Diagnosis not present

## 2018-04-17 DIAGNOSIS — M542 Cervicalgia: Secondary | ICD-10-CM | POA: Diagnosis not present

## 2018-04-17 DIAGNOSIS — M545 Low back pain: Secondary | ICD-10-CM | POA: Diagnosis not present

## 2018-04-17 DIAGNOSIS — M792 Neuralgia and neuritis, unspecified: Secondary | ICD-10-CM | POA: Diagnosis not present

## 2018-04-17 DIAGNOSIS — G35 Multiple sclerosis: Secondary | ICD-10-CM | POA: Diagnosis not present

## 2018-05-01 DIAGNOSIS — B009 Herpesviral infection, unspecified: Secondary | ICD-10-CM | POA: Diagnosis not present

## 2018-05-01 DIAGNOSIS — L209 Atopic dermatitis, unspecified: Secondary | ICD-10-CM | POA: Diagnosis not present

## 2018-05-30 DIAGNOSIS — Z6822 Body mass index (BMI) 22.0-22.9, adult: Secondary | ICD-10-CM | POA: Diagnosis not present

## 2018-05-30 DIAGNOSIS — G4731 Primary central sleep apnea: Secondary | ICD-10-CM

## 2018-05-30 DIAGNOSIS — Z124 Encounter for screening for malignant neoplasm of cervix: Secondary | ICD-10-CM | POA: Diagnosis not present

## 2018-05-30 DIAGNOSIS — Z1231 Encounter for screening mammogram for malignant neoplasm of breast: Secondary | ICD-10-CM | POA: Diagnosis not present

## 2018-05-30 DIAGNOSIS — R5383 Other fatigue: Secondary | ICD-10-CM | POA: Diagnosis not present

## 2018-05-30 DIAGNOSIS — Z01419 Encounter for gynecological examination (general) (routine) without abnormal findings: Secondary | ICD-10-CM | POA: Diagnosis not present

## 2018-05-30 HISTORY — DX: Primary central sleep apnea: G47.31

## 2018-07-05 DIAGNOSIS — M542 Cervicalgia: Secondary | ICD-10-CM | POA: Diagnosis not present

## 2018-07-05 DIAGNOSIS — G35 Multiple sclerosis: Secondary | ICD-10-CM | POA: Diagnosis not present

## 2018-07-05 DIAGNOSIS — M792 Neuralgia and neuritis, unspecified: Secondary | ICD-10-CM | POA: Diagnosis not present

## 2018-07-05 DIAGNOSIS — M545 Low back pain: Secondary | ICD-10-CM | POA: Diagnosis not present

## 2018-07-30 ENCOUNTER — Inpatient Hospital Stay
Admit: 2018-07-30 | Discharge: 2018-07-30 | Disposition: A | Discharge status: Home / Self Care | Payer: MEDICARE | Attending: Emergency Medicine

## 2018-07-30 ENCOUNTER — Emergency Department: Admit: 2018-07-30 | Payer: MEDICARE | Primary: Physician Assistant

## 2018-07-30 DIAGNOSIS — W109XXA Fall (on) (from) unspecified stairs and steps, initial encounter: Secondary | ICD-10-CM | POA: Diagnosis not present

## 2018-07-30 DIAGNOSIS — S82041A Displaced comminuted fracture of right patella, initial encounter for closed fracture: Secondary | ICD-10-CM | POA: Diagnosis not present

## 2018-07-30 DIAGNOSIS — R51 Headache: Secondary | ICD-10-CM | POA: Diagnosis not present

## 2018-07-30 DIAGNOSIS — Z79899 Other long term (current) drug therapy: Secondary | ICD-10-CM | POA: Diagnosis not present

## 2018-07-30 DIAGNOSIS — F17200 Nicotine dependence, unspecified, uncomplicated: Secondary | ICD-10-CM | POA: Diagnosis not present

## 2018-07-30 DIAGNOSIS — S0083XA Contusion of other part of head, initial encounter: Secondary | ICD-10-CM | POA: Diagnosis not present

## 2018-07-30 DIAGNOSIS — G44309 Post-traumatic headache, unspecified, not intractable: Secondary | ICD-10-CM | POA: Diagnosis not present

## 2018-07-30 MED ORDER — NAPROXEN 500 MG TAB
500 mg | ORAL_TABLET | Freq: Two times a day (BID) | ORAL | 0 refills | Status: AC
Start: 2018-07-30 — End: ?

## 2018-07-30 MED ORDER — KETOROLAC TROMETHAMINE 30 MG/ML INJECTION
30 mg/mL (1 mL) | INTRAMUSCULAR | Status: AC
Start: 2018-07-30 — End: 2018-07-30
  Administered 2018-07-30: 07:00:00 via INTRAMUSCULAR

## 2018-07-30 MED ORDER — OXYCODONE-ACETAMINOPHEN 5 MG-325 MG TAB
5-325 mg | ORAL_TABLET | Freq: Three times a day (TID) | ORAL | 0 refills | Status: AC | PRN
Start: 2018-07-30 — End: 2018-08-04

## 2018-07-30 MED ORDER — OXYCODONE-ACETAMINOPHEN 5 MG-325 MG TAB
5-325 mg | ORAL | Status: AC
Start: 2018-07-30 — End: 2018-07-30
  Administered 2018-07-30: 07:00:00 via ORAL

## 2018-07-30 MED ORDER — WHEELCHAIR
0 refills | Status: AC
Start: 2018-07-30 — End: ?

## 2018-07-30 MED ORDER — WALKER
Freq: Once | 0 refills | Status: AC
Start: 2018-07-30 — End: 2018-07-30

## 2018-07-30 MED FILL — OXYCODONE-ACETAMINOPHEN 5 MG-325 MG TAB: 5-325 mg | ORAL | Qty: 2

## 2018-07-30 MED FILL — KETOROLAC TROMETHAMINE 30 MG/ML INJECTION: 30 mg/mL (1 mL) | INTRAMUSCULAR | Qty: 1

## 2018-07-30 NOTE — ED Notes (Signed)
Patient has been instructed that they have been given percocet* which contains opioids, benzodiazepines, or other sedating drugs. Patient is aware that they  will need to refrain from driving or operating heavy machinery after taking this medication.  Patient also instructed that they need to avoid drinking alcohol and using other products containing opioids, benzodiazepines, or other sedating drugs.  Patient verbalized understanding. Pt has ride home at bedside.

## 2018-07-30 NOTE — ED Notes (Addendum)
Pt presents to ED via wheelchair complaining of fall 1.5 hours PTA in ED.  Pt reports she was pulled by her daughter's dog and fell down 3 steps. Pt reports she landed on her knees and face and felt her nose "crunch." Pt noted to have bruising and swelling to forehead, nose, and right knee. Pt also noted to have abrasions to forehead and bilateral knees. Pt denies she lost consciousness or vomited.  Pt is alert and oriented x 4, RR even and unlabored. Assessment completed and pt updated on plan of care.      Emergency Department Nursing Plan of Care       The Nursing Plan of Care is developed from the Nursing assessment and Emergency Department Attending provider initial evaluation.  The plan of care may be reviewed in the ???ED Provider note???.    The Plan of Care was developed with the following considerations:   Patient / Family readiness to learn indicated by:verbalized understanding  Persons(s) to be included in education: patient  Barriers to Learning/Limitations:No    Signed     Alaina Williams V    07/30/2018   1:18 AM

## 2018-07-30 NOTE — ED Provider Notes (Addendum)
EMERGENCY DEPARTMENT HISTORY AND PHYSICAL EXAM      Please note that this dictation was completed with Dragon, the computer voice recognition software. Quite often unanticipated grammatical, syntax, homophones, and other interpretive errors are inadvertently transcribed by the computer software. Please disregard these errors and any errors that have escaped final proofreading. Thank you.    Date: 07/30/2018  Patient Name: Jamie Boyle  Patient Age and Sex: 57 y.o. female    History of Presenting Illness     Chief Complaint   Patient presents with   ??? Fall       History Provided By: Patient    HPI: Jamie Boyle, 57 y.o. female with past medical history as documented below presents to the ED with c/o of acute onset of severe facial pain and right knee pain after fall about 90 mins PTA. She was pulled by her daugther's dog and fell down several steps. She denies LOC. She reports bruising to forehead and nose. She took no medications PTA. Pt denies any other alleviating or exacerbating factors. Additionally, pt specifically denies any recent fever, chills, headache, nausea, vomiting, abdominal pain, CP, SOB, lightheadedness, dizziness, numbness, weakness, BLE swelling, heart palpitations, urinary sxs, diarrhea, constipation, melena, hematochezia, cough, or congestion.     There are no other complaints, changes or physical findings at this time.     PCP: Cheryll Dessertrawford, Kristen Cox, PA    Past History   Past Medical History:  History reviewed. No pertinent past medical history.    Past Surgical History:  History reviewed. No pertinent surgical history.    Family History:  History reviewed. No pertinent family history.    Social History:  Social History     Tobacco Use   ??? Smoking status: Current Some Day Smoker     Years: 2.00   ??? Smokeless tobacco: Former NeurosurgeonUser   Substance Use Topics   ??? Alcohol use: Yes     Frequency: Never     Comment: occ   ??? Drug use: Never       Allergies:  No Known Allergies    Current Medications:   No current facility-administered medications on file prior to encounter.      Current Outpatient Medications on File Prior to Encounter   Medication Sig Dispense Refill   ??? fentaNYL (DURAGESIC) 100 mcg/hr PATCH 1 Patch by TransDERmal route every fourty-eight (48) hours.     ??? citalopram (CELEXA) 40 mg tablet Take 40 mg by mouth daily.     ??? oxyCODONE-acetaminophen (PERCOCET) 5-325 mg per tablet Take 1 Tab by mouth every four (4) hours as needed for Pain.     ??? cyclobenzaprine (FLEXERIL) 5 mg tablet Take 10 mg by mouth.     ??? Biotin 2,500 mcg cap Take  by mouth.         Review of Systems   Review of Systems   Constitutional: Negative.  Negative for chills and fever.   HENT: Negative.  Negative for congestion, facial swelling, rhinorrhea, sore throat, trouble swallowing and voice change.    Eyes: Negative.    Respiratory: Negative.  Negative for apnea, cough, chest tightness, shortness of breath and wheezing.    Cardiovascular: Negative.  Negative for chest pain, palpitations and leg swelling.   Gastrointestinal: Negative.  Negative for abdominal distention, abdominal pain, blood in stool, constipation, diarrhea, nausea and vomiting.   Endocrine: Negative.  Negative for cold intolerance, heat intolerance and polyuria.   Genitourinary: Negative.  Negative for difficulty urinating, dysuria, flank pain,  frequency, hematuria and urgency.   Musculoskeletal: Positive for arthralgias, joint swelling and myalgias. Negative for back pain, neck pain and neck stiffness.   Skin: Positive for wound. Negative for color change and rash.   Neurological: Positive for headaches. Negative for dizziness, syncope, facial asymmetry, speech difficulty, weakness, light-headedness and numbness.   Hematological: Bruises/bleeds easily.   Psychiatric/Behavioral: Negative.  Negative for confusion and self-injury. The patient is not nervous/anxious.        Physical Exam   Physical Exam  Vitals signs and nursing note reviewed.   Constitutional:        General: She is not in acute distress.     Appearance: She is well-developed. She is not diaphoretic.   HENT:      Head: Normocephalic.      Comments: Soft forehead hematoma, no laceration     Mouth/Throat:      Pharynx: No oropharyngeal exudate.   Eyes:      Conjunctiva/sclera: Conjunctivae normal.      Pupils: Pupils are equal, round, and reactive to light.   Neck:      Musculoskeletal: Normal range of motion.   Cardiovascular:      Rate and Rhythm: Normal rate and regular rhythm.      Heart sounds: Normal heart sounds. No murmur. No friction rub. No gallop.    Pulmonary:      Effort: Pulmonary effort is normal. No respiratory distress.      Breath sounds: Normal breath sounds. No wheezing or rales.   Chest:      Chest wall: No tenderness.   Abdominal:      General: Bowel sounds are normal. There is no distension.      Palpations: Abdomen is soft. There is no mass.      Tenderness: There is no tenderness. There is no guarding or rebound.   Musculoskeletal: Normal range of motion.         General: Swelling, tenderness (TTP over right knee, min effusion, NVI distally) and deformity present.   Skin:     General: Skin is warm.      Findings: No rash.   Neurological:      Mental Status: She is alert and oriented to person, place, and time.      Cranial Nerves: No cranial nerve deficit.      Motor: No abnormal muscle tone.      Coordination: Coordination normal.      Deep Tendon Reflexes: Reflexes normal.         Diagnostic Study Results     Labs -  No results found for this or any previous visit (from the past 24 hour(s)).    Radiologic Studies -   XR KNEE RT 3 V   Final Result   IMPRESSION:    1. Acute patellar fracture. Moderate joint effusion and marked prepatellar soft   tissue swelling.      2. Incidental nonaggressive bony lesion in the proximal tibia in keeping with an   enchondroma          CT MAXILLOFACIAL WO CONT   Final Result   IMPRESSION:    No acute abnormality.        CT Results  (Last 48 hours)                07/30/18 0215  CT MAXILLOFACIAL WO CONT Final result    Impression:  IMPRESSION:    No acute abnormality.       Narrative:  EXAM:  CT maxillofacial without contrast        INDICATION:  Facial pain after fall down steps with injury.       COMPARISON: None       TECHNIQUE: Helical CT of the facial bones with coronal and sagittal reformats.   Images reviewed in soft tissue and bone windows. CT dose reduction was achieved   through use of a standardized protocol tailored for this examination and   automatic exposure control for dose modulation.       CONTRAST: None.       FINDINGS:    There is no acute fracture. The temporomandibular joints are intact. The   paranasal sinuses and mastoid air cells are clear.       The facial soft tissues are unremarkable.       Limited intracranial images are unremarkable. The globes and orbits are   symmetric and within normal limits.               CXR Results  (Last 48 hours)    None          Medical Decision Making   I am the first provider for this patient.    I reviewed the vital signs, available nursing notes, past medical history, past surgical history, family history and social history.    Vital Signs-Reviewed the patient's vital signs.  Patient Vitals for the past 24 hrs:   Temp Pulse Resp BP SpO2   07/30/18 0108 98.2 ??F (36.8 ??C) 95 18 128/72 98 %     Pulse Oximetry Analysis - 98% on RA    Cardiac Monitor:   Rate: 95 bpm  Rhythm: Normal Sinus Rhythm      Records Reviewed: Nursing Notes, Old Medical Records, Previous electrocardiograms, Previous Radiology Studies and Previous Laboratory Studies    Provider Notes (Medical Decision Making):   Pt presents with acute right knee knee pain after injury. Neurovascularly intact distally. Given focal tenderness, query possible ligamentous injury however no gross instability. No tibial plateau tenderness. XR without frank fracture. Low suspicion for vascular injury with  dislocation-relocation. No ankle or hip pain. No back pain with low supicion for significant axial load. No systemic symptoms and nontoxic; given exam and history, low suspicion for septic arthritis, pyomyositis or necrotizing fascitis. No e/o compartment syndrome or DVT. Will recommend RICE therapy, pain control. Follow up with PMD and ortho as needed. Discussed return precautions; pt agrees to above plan.     Patient presents after fall with forehead/knee pain.  Will get imaging to further evaluate for fracture vs. Dislocation vs. Contusion.  Will treat with analgesics at this time and continue to monitor for changes in mentation.     I have discussed with the patient how to reduce likelihood of falling at home by removing throw rugs, loose wires or other objects from floor, installing hand-rails in bathrooms, tubs and halls, and leaving some lights on at night.    ED Course:   Initial assessment performed. The patients presenting problems have been discussed, and they are in agreement with the care plan formulated and outlined with them.  I have encouraged them to ask questions as they arise throughout their visit.    TOBACCO COUNSELING:   Upon evaluation, pt expressed that they are a current tobacco user. For approximately 10 minutes, pt has been counseled on the dangers of smoking and was encouraged to quit as soon as possible in order to decrease further risks to their health. Pt has conveyed  their understanding of the risks involved should they continue to use tobacco products.    ALCOHOL/SUBSTANCE ABUSE COUNSELING:  Upon evaluation, pt endorsed recent alcohol/illicit drug use. For approximately 15 minutes, pt has been counseled on the dangers of alcohol and illicit drug use on their health, and they were encouraged to quit as soon as possible in order to decrease further risks to their health. Pt has conveyed their understanding of the risks involved should they continue to use these products.     I reviewed our electronic medical record system for any past medical records that were available that may contribute to the patient's current condition, the nursing notes and vital signs from today's visit.  Marguerita Beards, MD    ED Orders Placed :  Orders Placed This Encounter   ??? APPLY KNEE IMMOBILIZER   ??? CT MAXILLOFACIAL WO CONT   ??? XR KNEE RT 3 V   ??? APPLY ICE TO SPECIFIED AREA   ??? APPLY ACE WRAP:SPECIFY ONE TIME STAT   ??? CRUTCHES WITH INSTRUCTIONS   ??? fentaNYL (DURAGESIC) 100 mcg/hr PATCH   ??? citalopram (CELEXA) 40 mg tablet   ??? oxyCODONE-acetaminophen (PERCOCET) 5-325 mg per tablet   ??? cyclobenzaprine (FLEXERIL) 5 mg tablet   ??? Biotin 2,500 mcg cap   ??? oxyCODONE-acetaminophen (PERCOCET) 5-325 mg per tablet 2 Tab   ??? ketorolac (TORADOL) injection 30 mg   ??? oxyCODONE-acetaminophen (PERCOCET) 5-325 mg per tablet   ??? naproxen (NAPROSYN) 500 mg tablet   ??? Walker misc   ??? Wheel Chair devi     ED Medications Administered:  Medications   oxyCODONE-acetaminophen (PERCOCET) 5-325 mg per tablet 2 Tab (2 Tabs Oral Given 07/30/18 0140)   ketorolac (TORADOL) injection 30 mg (30 mg IntraMUSCular Given 07/30/18 0140)     Procedure Note - Splint Placement:  Performed by: Jed Limerick, MD  Neurovascularly intact prior to tx.  An Orthoglass right knee immbolizer splint was placed on pt's right knee.  Joint was placed in extension.  Neurovascularly intact after tx. Pt also provided crutches with instructions a Rx for walker.  The procedure took 1-15 minutes, and pt tolerated well.       Progress Note:  Patient has been reassessed and reports feeling better and symptoms have improved significantly after ED treatment. Patient feels comfortable going home with close follow-up. Marybelle Killings final labs and imaging have been reviewed with her and available family and/or caregiver. They have been counseled regarding her diagnosis. She verbally conveys understanding and agreement of the signs, symptoms, diagnosis, treatment and prognosis  and additionally agrees to follow up as recommended with Dr. Okey Dupre, Mickey Farber, PA and/or specialist in 24 - 48 hours. She also agrees with the care-plan we created together and conveys that all of her questions have been answered.  I have also put together some discharge instructions for her that include: 1) educational information regarding their diagnosis, 2) how to care for their diagnosis at home, as well a 3) list of reasons why they would want to return to the ED prior to their follow-up appointment should the patient's condition change or symptoms worsen.    I have answered all questions to the patient's satisfaction. Strict return precautions given. She both understood and agreed with plan as discussed. Vital signs stable for discharge.     Disposition: DISCHARGE  The pt is ready for discharge. The pt's signs, symptoms, diagnosis, and discharge instructions have been discussed and pt has conveyed their understanding. The pt is  to follow up as recommended or return to ER should their symptoms worsen. Plan has been discussed and pt is in agreement.    PLAN:  1. Return precautions as discussed.    2.   Discharge Medication List as of 07/30/2018  2:30 AM      START taking these medications    Details   !! oxyCODONE-acetaminophen (PERCOCET) 5-325 mg per tablet Take 1 Tab by mouth every eight (8) hours as needed for Pain for up to 5 days. Max Daily Amount: 3 Tabs. Indications: pain, Print, Disp-10 Tab, R-0      naproxen (NAPROSYN) 500 mg tablet Take 1 Tab by mouth two (2) times daily (with meals)., Print, Disp-20 Tab, R-0      Walker misc 1 Units by Does Not Apply route once for 1 dose. Rolling walker, Print, Disp-1 Each, R-0       !! - Potential duplicate medications found. Please discuss with provider.      CONTINUE these medications which have NOT CHANGED    Details   fentaNYL (DURAGESIC) 100 mcg/hr PATCH 1 Patch by TransDERmal route every fourty-eight (48) hours., Historical Med       citalopram (CELEXA) 40 mg tablet Take 40 mg by mouth daily., Historical Med      !! oxyCODONE-acetaminophen (PERCOCET) 5-325 mg per tablet Take 1 Tab by mouth every four (4) hours as needed for Pain., Historical Med      cyclobenzaprine (FLEXERIL) 5 mg tablet Take 10 mg by mouth., Historical Med      Biotin 2,500 mcg cap Take  by mouth., Historical Med       !! - Potential duplicate medications found. Please discuss with provider.        3.   Follow-up Information     Follow up With Specialties Details Why Contact Info    Cheryll Dessert, PA Physician Assistant       [facility] EMERGENCY DEPT Emergency Medicine  As needed, If symptoms worsen [address] [place] [address]  [phone]    Job Founds, MD Orthopedic Surgery Schedule an appointment as soon as possible for a visit in 1 day  [address]  [address]  [place] [address]  [phone]            Return to ED if worse  Diagnosis     Clinical Impression:   1. Closed displaced comminuted fracture of right patella, initial encounter    2. Traumatic hematoma of forehead, initial encounter    3. Fall from ground level    4. Facial pain        Attestation:  I personally performed the services described in this documentation on this date 07/30/2018 for patient, Jamie Boyle.  Jed Limerick, MD      This note will not be viewable in MyChart.

## 2018-07-30 NOTE — ED Notes (Signed)
Discharge instructions were given to the patient by Alaina Williams V, RN.     The patient left the Emergency Department ambulatory, alert and oriented and in no acute distress with 3 prescriptions. The patient was encouraged to call or return to the ED for worsening issues or problems and was encouraged to schedule a follow up appointment for continuing care.     The patient verbalized understanding of discharge instructions and prescriptions, all questions were answered. The patient has no further concerns at this time.

## 2018-07-30 NOTE — ED Notes (Signed)
 Pt presents to ED via wheelchair complaining of fall 1.5 hours PTA in ED.  Pt reports she was pulled by her daughter's dog and fell down 3 steps. Pt reports she landed on her knees and face and felt her nose crunch. Pt noted to have bruising and swelling to forehead, nose, and right knee. Pt also noted to have abrasions to forehead and bilateral knees. Pt denies she lost consciousness or vomited.  Pt is alert and oriented x 4, RR even and unlabored. Assessment completed and pt updated on plan of care.      Emergency Department Nursing Plan of Care       The Nursing Plan of Care is developed from the Nursing assessment and Emergency Department Attending provider initial evaluation.  The plan of care may be reviewed in the "ED Provider note".    The Plan of Care was developed with the following considerations:   Patient / Family readiness to learn indicated ab:czmajopszi understanding  Persons(s) to be included in education: patient  Barriers to Learning/Limitations:No    Signed     Luster Trudy GAILS    07/30/2018   1:18 AM

## 2018-07-30 NOTE — ED Notes (Signed)
 Discharge instructions were given to the patient by Simone Curia, RN.     The patient left the Emergency Department ambulatory, alert and oriented and in no acute distress with 3 prescriptions. The patient was encouraged to call or return to the ED for worsening issues or problems and was encouraged to schedule a follow up appointment for continuing care.     The patient verbalized understanding of discharge instructions and prescriptions, all questions were answered. The patient has no further concerns at this time.

## 2018-07-30 NOTE — ED Provider Notes (Signed)
ED  Provider Notes by Jed Limerick, MD at 07/30/18 0254                Author: Jed Limerick, MD  Service: Emergency Medicine  Author Type: Physician       Filed: 08/08/18 1139  Date of Service: 07/30/18 0254  Status: Addendum          Editor: Jed Limerick, MD (Physician)          Related Notes: Original Note by Jed Limerick, MD (Physician) filed at 08/05/18 1952               EMERGENCY DEPARTMENT HISTORY AND PHYSICAL EXAM           Please note that this dictation was completed with Dragon, the computer voice recognition software. Quite often unanticipated grammatical, syntax, homophones, and other interpretive errors are  inadvertently transcribed by the computer software. Please disregard these errors and any errors that have escaped final proofreading. Thank you.      Date: 07/30/2018   Patient Name: Jamie Boyle   Patient Age and Sex: 57 y.o.  female        History of Presenting Illness          Chief Complaint       Patient presents with        ?  Fall           History Provided By: Patient      HPI: Jamie Boyle,  57 y.o. female with past medical history as documented below presents to the ED with c/o  of acute onset of severe facial pain and right knee pain after fall about 90 mins PTA. She was pulled by her daugther's dog and fell down several steps. She denies LOC. She reports bruising to forehead and nose. She took no medications PTA. Pt denies  any other alleviating or exacerbating factors. Additionally, pt specifically denies any recent fever, chills, headache, nausea, vomiting, abdominal pain, CP, SOB, lightheadedness, dizziness, numbness, weakness, BLE swelling, heart palpitations, urinary  sxs, diarrhea, constipation, melena, hematochezia, cough, or congestion.       There are no other complaints, changes or physical findings at this time.       PCP: Cheryll Dessert, PA        Past History     Past Medical History:   History reviewed. No pertinent past medical history.      Past Surgical  History:   History reviewed. No pertinent surgical history.      Family History:   History reviewed. No pertinent family history.      Social History:     Social History          Tobacco Use         ?  Smoking status:  Current Some Day Smoker              Years:  2.00         ?  Smokeless tobacco:  Former Network engineer Use Topics         ?  Alcohol use:  Yes              Frequency:  Never             Comment: occ         ?  Drug use:  Never           Allergies:   No Known Allergies  Current Medications:     No current facility-administered medications on file prior to encounter.           Current Outpatient Medications on File Prior to Encounter          Medication  Sig  Dispense  Refill           ?  fentaNYL (DURAGESIC) 100 mcg/hr PATCH  1 Patch by TransDERmal route every fourty-eight (48) hours.         ?  citalopram (CELEXA) 40 mg tablet  Take 40 mg by mouth daily.         ?  oxyCODONE-acetaminophen (PERCOCET) 5-325 mg per tablet  Take 1 Tab by mouth every four (4) hours as needed for Pain.         ?  cyclobenzaprine (FLEXERIL) 5 mg tablet  Take 10 mg by mouth.               ?  Biotin 2,500 mcg cap  Take  by mouth.                 Review of Systems     Review of Systems    Constitutional: Negative.  Negative for chills and fever.    HENT: Negative.  Negative for congestion, facial swelling, rhinorrhea, sore throat, trouble swallowing and voice change.     Eyes: Negative.     Respiratory: Negative.  Negative for apnea, cough, chest tightness, shortness of breath and wheezing.     Cardiovascular: Negative.  Negative for chest pain, palpitations and leg swelling.    Gastrointestinal: Negative.  Negative for abdominal distention, abdominal pain, blood in stool, constipation, diarrhea, nausea and vomiting.    Endocrine: Negative.  Negative for cold intolerance, heat intolerance and polyuria.    Genitourinary: Negative.  Negative for difficulty urinating, dysuria, flank pain, frequency, hematuria and  urgency.    Musculoskeletal: Positive for arthralgias, joint swelling  and myalgias. Negative for back pain, neck pain and neck stiffness.    Skin: Positive for wound. Negative for color change and rash.    Neurological: Positive for headaches. Negative for dizziness, syncope, facial asymmetry, speech difficulty, weakness, light-headedness and  numbness.    Hematological: Bruises/bleeds easily.    Psychiatric/Behavioral: Negative.  Negative for confusion and self-injury. The patient is not nervous/anxious.             Physical Exam     Physical Exam   Vitals signs and nursing note reviewed.   Constitutional:        General: She is not in acute distress.     Appearance: She is well-developed. She is not diaphoretic.    HENT:       Head: Normocephalic.      Comments: Soft forehead hematoma, no laceration     Mouth/Throat:      Pharynx: No oropharyngeal  exudate.   Eyes :       Conjunctiva/sclera: Conjunctivae normal.      Pupils: Pupils are equal, round, and reactive to light.    Neck:       Musculoskeletal: Normal range of motion.   Cardiovascular :       Rate and Rhythm: Normal rate and regular rhythm.      Heart sounds: Normal heart sounds. No murmur. No friction rub. No gallop.     Pulmonary:       Effort: Pulmonary effort is normal. No respiratory distress.      Breath sounds: Normal breath sounds. No wheezing  or rales.   Chest:       Chest wall: No tenderness.   Abdominal :      General: Bowel sounds are normal. There is no distension.      Palpations: Abdomen is soft. There is no mass.      Tenderness: There is no tenderness. There is no guarding or rebound.     Musculoskeletal: Normal range of motion.          General: Swelling, tenderness  (TTP over right knee, min effusion, NVI distally) and deformity  present.    Skin:      General: Skin is warm.      Findings: No rash.   Neurological :       Mental Status: She is alert and oriented to person, place, and time.      Cranial Nerves: No cranial nerve deficit.       Motor: No abnormal muscle tone.      Coordination: Coordination normal.      Deep Tendon Reflexes: Reflexes normal.              Diagnostic Study Results        Labs -   No results found for this or any previous visit (from the past 24 hour(s)).      Radiologic Studies -      XR KNEE RT 3 V       Final Result     IMPRESSION:      1. Acute patellar fracture. Moderate joint effusion and marked prepatellar soft     tissue swelling.          2. Incidental nonaggressive bony lesion in the proximal tibia in keeping with an     enchondroma                  CT MAXILLOFACIAL WO CONT       Final Result     IMPRESSION:      No acute abnormality.                 CT Results  (Last 48 hours)                                    07/30/18 0215    CT MAXILLOFACIAL WO CONT  Final result            Impression:    IMPRESSION:       No acute abnormality.                       Narrative:    EXAM:  CT maxillofacial without contrast              INDICATION:  Facial pain after fall down steps with injury.             COMPARISON: None             TECHNIQUE: Helical CT of the facial bones with coronal and sagittal reformats.      Images reviewed in soft tissue and bone windows. CT dose reduction was achieved      through use of a standardized protocol tailored for this examination and      automatic exposure control for dose modulation.             CONTRAST: None.  FINDINGS:       There is no acute fracture. The temporomandibular joints are intact. The      paranasal sinuses and mastoid air cells are clear.             The facial soft tissues are unremarkable.             Limited intracranial images are unremarkable. The globes and orbits are      symmetric and within normal limits.                                 CXR Results  (Last 48  hours)          None                    Medical Decision Making     I am the first provider for this patient.      I reviewed the vital signs, available nursing notes, past medical history, past  surgical history, family history and social history.      Vital Signs-Reviewed the patient's vital signs.   Patient Vitals for the past 24 hrs:            Temp  Pulse  Resp  BP  SpO2            07/30/18 0108  98.2 ??F (36.8 ??C)  95  18  128/72  98 %        Pulse Oximetry Analysis - 98% on RA      Cardiac Monitor:    Rate: 95 bpm   Rhythm: Normal Sinus Rhythm        Records Reviewed: Nursing Notes, Old Medical Records, Previous electrocardiograms, Previous Radiology Studies and Previous Laboratory Studies      Provider Notes (Medical Decision Making):    Pt presents with acute right knee knee pain after injury. Neurovascularly intact distally. Given focal tenderness, query possible ligamentous injury however no gross instability. No tibial plateau tenderness.  XR without frank fracture. Low suspicion for vascular injury with dislocation-relocation. No ankle or hip pain. No back pain with low supicion for significant axial load. No systemic symptoms and nontoxic; given exam and history, low suspicion for septic  arthritis, pyomyositis or necrotizing fascitis. No e/o compartment syndrome or DVT. Will recommend RICE therapy, pain control. Follow up with PMD and ortho as needed. Discussed return precautions; pt agrees to above plan.       Patient presents after fall with forehead/knee pain.  Will get imaging to further evaluate for fracture vs. Dislocation vs. Contusion.  Will treat with analgesics at this time and continue to monitor for changes in mentation.       I have discussed with the patient how to reduce likelihood of falling at home by removing throw rugs, loose wires or other objects from floor, installing hand-rails in bathrooms, tubs and halls, and leaving some lights on at night.      ED Course:    Initial assessment performed. The patients presenting problems have been discussed, and they are in agreement with the care plan formulated and outlined with them.  I have encouraged them to ask questions as they  arise throughout their visit.      TOBACCO COUNSELING:    Upon evaluation, pt expressed that they are a current tobacco user. For approximately 10 minutes, pt has been counseled on the dangers of smoking and was encouraged  to quit as soon as possible in order  to decrease further risks to their health. Pt has conveyed their understanding of the risks involved should they continue to use tobacco products.      ALCOHOL/SUBSTANCE ABUSE COUNSELING:   Upon evaluation, pt endorsed recent alcohol/illicit drug use. For approximately 15 minutes, pt has been counseled on the dangers of alcohol and illicit drug use on their health, and they were encouraged  to quit as soon as possible in order to decrease further risks to their health. Pt has conveyed their understanding of the risks involved should they continue to use these products.      I reviewed our electronic medical record system for any past medical records that were available that may contribute to the patient's current condition, the nursing notes and vital signs from  today's visit.  Marguerita Beards, MD      ED Orders Placed :     Orders Placed This Encounter        ?  APPLY KNEE IMMOBILIZER     ?  CT MAXILLOFACIAL WO CONT     ?  XR KNEE RT 3 V     ?  APPLY ICE TO SPECIFIED AREA     ?  APPLY ACE WRAP:SPECIFY ONE TIME STAT     ?  CRUTCHES WITH INSTRUCTIONS     ?  fentaNYL (DURAGESIC) 100 mcg/hr PATCH     ?  citalopram (CELEXA) 40 mg tablet     ?  oxyCODONE-acetaminophen (PERCOCET) 5-325 mg per tablet     ?  cyclobenzaprine (FLEXERIL) 5 mg tablet     ?  Biotin 2,500 mcg cap     ?  oxyCODONE-acetaminophen (PERCOCET) 5-325 mg per tablet 2 Tab     ?  ketorolac (TORADOL) injection 30 mg     ?  oxyCODONE-acetaminophen (PERCOCET) 5-325 mg per tablet     ?  naproxen (NAPROSYN) 500 mg tablet     ?  Walker misc        ?  Wheel Chair devi        ED Medications Administered:     Medications       oxyCODONE-acetaminophen (PERCOCET) 5-325 mg per tablet 2 Tab (2 Tabs Oral Given 07/30/18  0140)       ketorolac (TORADOL) injection 30 mg (30 mg IntraMUSCular Given 07/30/18 0140)        Procedure Note - Splint Placement:   Performed by: Jed Limerick, MD   Neurovascularly intact prior to tx.  An Orthoglass right knee immbolizer splint was placed on pt's right knee.  Joint was placed in extension.  Neurovascularly intact after tx. Pt also provided crutches with instructions a Rx for walker.   The procedure took 1-15 minutes, and pt tolerated well.          Progress Note:   Patient has been reassessed and reports feeling better and symptoms have improved significantly after ED treatment. Patient feels comfortable going home with close follow-up.  Marybelle Killings final labs and imaging have been reviewed with her and available family  and/or caregiver. They have been counseled regarding her diagnosis. She  verbally conveys understanding and agreement of the signs, symptoms, diagnosis, treatment and prognosis and additionally agrees to follow up as recommended with Dr. Okey Dupre, Mickey Farber, PA  and/or specialist in 24 - 48 hours. She also agrees with the care-plan we created together and conveys that all of  her questions have been answered.  I have also  put together some discharge instructions for her  that include: 1) educational information regarding their diagnosis, 2) how to care for their diagnosis at home, as well a 3) list of reasons why they would want to return to the ED prior to their follow-up appointment should the patient's condition change  or symptoms worsen.      I have answered all questions to the patient's satisfaction. Strict return precautions given. She both understood and agreed with plan as discussed. Vital signs stable for discharge.       Disposition: DISCHARGE   The pt is ready for discharge. The pt's signs, symptoms, diagnosis, and discharge instructions have been discussed and pt has conveyed their understanding. The pt is to follow up as recommended or return  to ER should  their symptoms worsen. Plan has been discussed and pt is in agreement.      PLAN:   1. Return precautions as discussed.      2.      Discharge Medication List as of 07/30/2018  2:30 AM              START taking these medications          Details        !! oxyCODONE-acetaminophen (PERCOCET) 5-325 mg per tablet  Take 1 Tab by mouth every eight (8) hours as needed for Pain for up to 5 days. Max Daily Amount: 3 Tabs. Indications: pain, Print, Disp-10 Tab, R-0               naproxen (NAPROSYN) 500 mg tablet  Take 1 Tab by mouth two (2) times daily (with meals)., Print, Disp-20 Tab, R-0               Walker misc  1 Units by Does Not Apply route once for 1 dose. Rolling walker, Print, Disp-1 Each, R-0               !! - Potential duplicate medications found. Please discuss with provider.              CONTINUE these medications which have NOT CHANGED          Details        fentaNYL (DURAGESIC) 100 mcg/hr PATCH  1 Patch by TransDERmal route every fourty-eight (48) hours., Historical Med               citalopram (CELEXA) 40 mg tablet  Take 40 mg by mouth daily., Historical Med               !! oxyCODONE-acetaminophen (PERCOCET) 5-325 mg per tablet  Take 1 Tab by mouth every four (4) hours as needed for Pain., Historical Med               cyclobenzaprine (FLEXERIL) 5 mg tablet  Take 10 mg by mouth., Historical Med               Biotin 2,500 mcg cap  Take  by mouth., Historical Med               !! - Potential duplicate medications found. Please discuss with provider.               3.      Follow-up Information               Follow up With  Specialties  Details  Why  Contact Info  Cheryll Dessert, PA  Physician Assistant                    [facility] EM[facility]  Emergency Medicine    As needed, If symptoms worsen  [address] [place] [address]   [phone]              Job Founds, MD  Orthopedic Surgery  Schedule an appointment as soon as possible for a visit in 1 day    [address]    [address]   [place] [address]   [phone]                   Return to ED if worse     Diagnosis        Clinical Impression:       1.  Closed displaced comminuted fracture of right patella, initial encounter      2.  Traumatic hematoma of forehead, initial encounter      3.  Fall from ground level         4.  Facial pain            Attestation:   I personally performed the services described in this documentation on this date 07/30/2018 for patient, Jamie Boyle.  Jed Limerick, MD           This note will not be viewable in MyChart.

## 2018-08-05 DIAGNOSIS — S82034A Nondisplaced transverse fracture of right patella, initial encounter for closed fracture: Secondary | ICD-10-CM | POA: Diagnosis not present

## 2018-08-05 DIAGNOSIS — S82001A Unspecified fracture of right patella, initial encounter for closed fracture: Secondary | ICD-10-CM | POA: Diagnosis not present

## 2018-08-05 DIAGNOSIS — Z23 Encounter for immunization: Secondary | ICD-10-CM | POA: Diagnosis not present

## 2018-08-16 DIAGNOSIS — S82034D Nondisplaced transverse fracture of right patella, subsequent encounter for closed fracture with routine healing: Secondary | ICD-10-CM | POA: Diagnosis not present

## 2018-08-23 DIAGNOSIS — S82034D Nondisplaced transverse fracture of right patella, subsequent encounter for closed fracture with routine healing: Secondary | ICD-10-CM | POA: Diagnosis not present

## 2018-09-13 DIAGNOSIS — S82034D Nondisplaced transverse fracture of right patella, subsequent encounter for closed fracture with routine healing: Secondary | ICD-10-CM | POA: Diagnosis not present

## 2018-09-17 DIAGNOSIS — M1990 Unspecified osteoarthritis, unspecified site: Secondary | ICD-10-CM

## 2018-09-17 DIAGNOSIS — H469 Unspecified optic neuritis: Secondary | ICD-10-CM

## 2018-09-17 DIAGNOSIS — M4800 Spinal stenosis, site unspecified: Secondary | ICD-10-CM | POA: Insufficient documentation

## 2018-09-17 HISTORY — DX: Unspecified osteoarthritis, unspecified site: M19.90

## 2018-09-17 HISTORY — DX: Unspecified optic neuritis: H46.9

## 2018-09-17 HISTORY — DX: Spinal stenosis, site unspecified: M48.00

## 2018-09-18 DIAGNOSIS — E2839 Other primary ovarian failure: Secondary | ICD-10-CM | POA: Diagnosis not present

## 2018-09-18 DIAGNOSIS — M818 Other osteoporosis without current pathological fracture: Secondary | ICD-10-CM | POA: Diagnosis not present

## 2018-09-18 DIAGNOSIS — M810 Age-related osteoporosis without current pathological fracture: Secondary | ICD-10-CM | POA: Diagnosis not present

## 2018-10-04 DIAGNOSIS — S82034D Nondisplaced transverse fracture of right patella, subsequent encounter for closed fracture with routine healing: Secondary | ICD-10-CM | POA: Diagnosis not present

## 2018-10-18 DIAGNOSIS — R030 Elevated blood-pressure reading, without diagnosis of hypertension: Secondary | ICD-10-CM | POA: Diagnosis not present

## 2018-10-18 DIAGNOSIS — M545 Low back pain: Secondary | ICD-10-CM | POA: Diagnosis not present

## 2018-10-18 DIAGNOSIS — G35 Multiple sclerosis: Secondary | ICD-10-CM | POA: Diagnosis not present

## 2018-10-18 DIAGNOSIS — M542 Cervicalgia: Secondary | ICD-10-CM | POA: Diagnosis not present

## 2018-10-22 DIAGNOSIS — S82034D Nondisplaced transverse fracture of right patella, subsequent encounter for closed fracture with routine healing: Secondary | ICD-10-CM | POA: Diagnosis not present

## 2018-10-22 DIAGNOSIS — X58XXXD Exposure to other specified factors, subsequent encounter: Secondary | ICD-10-CM | POA: Diagnosis not present

## 2018-10-22 DIAGNOSIS — M25561 Pain in right knee: Secondary | ICD-10-CM | POA: Diagnosis not present

## 2018-10-25 DIAGNOSIS — S82034D Nondisplaced transverse fracture of right patella, subsequent encounter for closed fracture with routine healing: Secondary | ICD-10-CM | POA: Diagnosis not present

## 2018-10-25 DIAGNOSIS — S83241A Other tear of medial meniscus, current injury, right knee, initial encounter: Secondary | ICD-10-CM | POA: Diagnosis not present

## 2018-10-25 DIAGNOSIS — M25561 Pain in right knee: Secondary | ICD-10-CM | POA: Diagnosis not present

## 2018-11-06 DIAGNOSIS — M25561 Pain in right knee: Secondary | ICD-10-CM | POA: Diagnosis not present

## 2018-11-06 DIAGNOSIS — R269 Unspecified abnormalities of gait and mobility: Secondary | ICD-10-CM | POA: Diagnosis not present

## 2018-11-06 DIAGNOSIS — M6281 Muscle weakness (generalized): Secondary | ICD-10-CM | POA: Diagnosis not present

## 2018-11-20 DIAGNOSIS — M8580 Other specified disorders of bone density and structure, unspecified site: Secondary | ICD-10-CM | POA: Insufficient documentation

## 2018-11-20 DIAGNOSIS — N950 Postmenopausal bleeding: Secondary | ICD-10-CM | POA: Diagnosis not present

## 2018-11-20 HISTORY — DX: Other specified disorders of bone density and structure, unspecified site: M85.80

## 2018-12-24 DIAGNOSIS — G35 Multiple sclerosis: Secondary | ICD-10-CM | POA: Diagnosis not present

## 2019-03-03 DIAGNOSIS — Z0000 Encounter for general adult medical examination without abnormal findings: Secondary | ICD-10-CM | POA: Diagnosis not present

## 2019-03-03 DIAGNOSIS — M818 Other osteoporosis without current pathological fracture: Secondary | ICD-10-CM | POA: Diagnosis not present

## 2019-03-24 DIAGNOSIS — M792 Neuralgia and neuritis, unspecified: Secondary | ICD-10-CM | POA: Diagnosis not present

## 2019-03-24 DIAGNOSIS — G35 Multiple sclerosis: Secondary | ICD-10-CM | POA: Diagnosis not present

## 2019-05-13 DIAGNOSIS — G35 Multiple sclerosis: Secondary | ICD-10-CM | POA: Diagnosis not present

## 2019-06-18 DIAGNOSIS — R030 Elevated blood-pressure reading, without diagnosis of hypertension: Secondary | ICD-10-CM | POA: Diagnosis not present

## 2019-06-18 DIAGNOSIS — M545 Low back pain: Secondary | ICD-10-CM | POA: Diagnosis not present

## 2019-06-18 DIAGNOSIS — M792 Neuralgia and neuritis, unspecified: Secondary | ICD-10-CM | POA: Diagnosis not present

## 2019-06-18 DIAGNOSIS — G35 Multiple sclerosis: Secondary | ICD-10-CM | POA: Diagnosis not present

## 2019-09-15 DIAGNOSIS — G35 Multiple sclerosis: Secondary | ICD-10-CM | POA: Diagnosis not present

## 2019-09-15 DIAGNOSIS — M792 Neuralgia and neuritis, unspecified: Secondary | ICD-10-CM | POA: Diagnosis not present

## 2019-09-15 DIAGNOSIS — M545 Low back pain: Secondary | ICD-10-CM | POA: Diagnosis not present

## 2019-11-26 DIAGNOSIS — H26493 Other secondary cataract, bilateral: Secondary | ICD-10-CM | POA: Diagnosis not present

## 2019-11-26 DIAGNOSIS — H524 Presbyopia: Secondary | ICD-10-CM | POA: Diagnosis not present

## 2019-12-11 DIAGNOSIS — M545 Low back pain: Secondary | ICD-10-CM | POA: Diagnosis not present

## 2019-12-11 DIAGNOSIS — G35 Multiple sclerosis: Secondary | ICD-10-CM | POA: Diagnosis not present

## 2020-02-04 ENCOUNTER — Other Ambulatory Visit: Payer: Self-pay | Admitting: Obstetrics and Gynecology

## 2020-02-04 DIAGNOSIS — N6310 Unspecified lump in the right breast, unspecified quadrant: Secondary | ICD-10-CM

## 2020-02-04 DIAGNOSIS — N6320 Unspecified lump in the left breast, unspecified quadrant: Secondary | ICD-10-CM

## 2020-02-11 ENCOUNTER — Ambulatory Visit
Admission: RE | Admit: 2020-02-11 | Discharge: 2020-02-11 | Disposition: A | Discharge status: Home / Self Care | Payer: BC Managed Care – PPO | Source: Ambulatory Visit | Attending: Obstetrics and Gynecology | Admitting: Obstetrics and Gynecology

## 2020-02-11 ENCOUNTER — Other Ambulatory Visit: Payer: Self-pay

## 2020-02-11 ENCOUNTER — Ambulatory Visit: Payer: BC Managed Care – PPO

## 2020-02-11 ENCOUNTER — Ambulatory Visit
Admission: RE | Admit: 2020-02-11 | Discharge: 2020-02-11 | Disposition: A | Discharge status: Home / Self Care | Payer: Medicare HMO | Source: Ambulatory Visit | Attending: Obstetrics and Gynecology | Admitting: Obstetrics and Gynecology

## 2020-02-11 DIAGNOSIS — R928 Other abnormal and inconclusive findings on diagnostic imaging of breast: Secondary | ICD-10-CM | POA: Diagnosis not present

## 2020-02-11 DIAGNOSIS — N6320 Unspecified lump in the left breast, unspecified quadrant: Secondary | ICD-10-CM

## 2020-02-11 DIAGNOSIS — N6310 Unspecified lump in the right breast, unspecified quadrant: Secondary | ICD-10-CM

## 2020-03-03 DIAGNOSIS — H9190 Unspecified hearing loss, unspecified ear: Secondary | ICD-10-CM | POA: Diagnosis not present

## 2020-03-03 DIAGNOSIS — H9319 Tinnitus, unspecified ear: Secondary | ICD-10-CM | POA: Diagnosis not present

## 2020-03-03 DIAGNOSIS — H9209 Otalgia, unspecified ear: Secondary | ICD-10-CM | POA: Diagnosis not present

## 2020-03-03 DIAGNOSIS — M26629 Arthralgia of temporomandibular joint, unspecified side: Secondary | ICD-10-CM | POA: Diagnosis not present

## 2020-03-03 DIAGNOSIS — G35 Multiple sclerosis: Secondary | ICD-10-CM | POA: Diagnosis not present

## 2020-03-03 DIAGNOSIS — G4731 Primary central sleep apnea: Secondary | ICD-10-CM | POA: Diagnosis not present

## 2020-03-03 DIAGNOSIS — R0981 Nasal congestion: Secondary | ICD-10-CM | POA: Diagnosis not present

## 2020-03-03 DIAGNOSIS — J342 Deviated nasal septum: Secondary | ICD-10-CM | POA: Diagnosis not present

## 2020-03-03 DIAGNOSIS — J3489 Other specified disorders of nose and nasal sinuses: Secondary | ICD-10-CM | POA: Diagnosis not present

## 2020-03-31 DIAGNOSIS — J342 Deviated nasal septum: Secondary | ICD-10-CM | POA: Diagnosis not present

## 2020-03-31 DIAGNOSIS — H9319 Tinnitus, unspecified ear: Secondary | ICD-10-CM | POA: Diagnosis not present

## 2020-03-31 DIAGNOSIS — M542 Cervicalgia: Secondary | ICD-10-CM | POA: Diagnosis not present

## 2020-03-31 DIAGNOSIS — H9202 Otalgia, left ear: Secondary | ICD-10-CM | POA: Diagnosis not present

## 2020-04-01 ENCOUNTER — Other Ambulatory Visit: Payer: Self-pay | Admitting: Physician Assistant

## 2020-04-01 DIAGNOSIS — M542 Cervicalgia: Secondary | ICD-10-CM

## 2020-04-07 DIAGNOSIS — H93293 Other abnormal auditory perceptions, bilateral: Secondary | ICD-10-CM | POA: Diagnosis not present

## 2020-04-07 DIAGNOSIS — G35 Multiple sclerosis: Secondary | ICD-10-CM | POA: Diagnosis not present

## 2020-04-07 DIAGNOSIS — M545 Low back pain: Secondary | ICD-10-CM | POA: Diagnosis not present

## 2020-04-07 DIAGNOSIS — M792 Neuralgia and neuritis, unspecified: Secondary | ICD-10-CM | POA: Diagnosis not present

## 2020-04-07 DIAGNOSIS — H9312 Tinnitus, left ear: Secondary | ICD-10-CM | POA: Diagnosis not present

## 2020-04-15 ENCOUNTER — Ambulatory Visit
Admission: RE | Admit: 2020-04-15 | Discharge: 2020-04-15 | Disposition: A | Discharge status: Home / Self Care | Payer: Medicare HMO | Source: Ambulatory Visit | Attending: Physician Assistant | Admitting: Physician Assistant

## 2020-04-15 DIAGNOSIS — M47812 Spondylosis without myelopathy or radiculopathy, cervical region: Secondary | ICD-10-CM | POA: Diagnosis not present

## 2020-04-15 DIAGNOSIS — I700 Atherosclerosis of aorta: Secondary | ICD-10-CM | POA: Diagnosis not present

## 2020-04-15 DIAGNOSIS — J432 Centrilobular emphysema: Secondary | ICD-10-CM | POA: Diagnosis not present

## 2020-04-15 DIAGNOSIS — M542 Cervicalgia: Secondary | ICD-10-CM | POA: Diagnosis not present

## 2020-04-15 MED ORDER — IOPAMIDOL (ISOVUE-300) INJECTION 61%
75.0000 mL | Freq: Once | INTRAVENOUS | Status: AC | PRN
  Administered 2020-04-15: 75 mL via INTRAVENOUS

## 2020-04-16 DIAGNOSIS — H9312 Tinnitus, left ear: Secondary | ICD-10-CM | POA: Diagnosis not present

## 2020-04-16 DIAGNOSIS — H9203 Otalgia, bilateral: Secondary | ICD-10-CM | POA: Diagnosis not present

## 2020-05-24 DIAGNOSIS — S93492A Sprain of other ligament of left ankle, initial encounter: Secondary | ICD-10-CM | POA: Diagnosis not present

## 2020-05-24 DIAGNOSIS — S9032XA Contusion of left foot, initial encounter: Secondary | ICD-10-CM | POA: Diagnosis not present

## 2020-05-24 DIAGNOSIS — S90122A Contusion of left lesser toe(s) without damage to nail, initial encounter: Secondary | ICD-10-CM | POA: Diagnosis not present

## 2020-07-14 DIAGNOSIS — G35 Multiple sclerosis: Secondary | ICD-10-CM | POA: Diagnosis not present

## 2020-07-14 DIAGNOSIS — M5416 Radiculopathy, lumbar region: Secondary | ICD-10-CM | POA: Diagnosis not present

## 2020-07-14 DIAGNOSIS — M792 Neuralgia and neuritis, unspecified: Secondary | ICD-10-CM | POA: Diagnosis not present

## 2020-08-03 DIAGNOSIS — N6011 Diffuse cystic mastopathy of right breast: Secondary | ICD-10-CM | POA: Diagnosis not present

## 2020-08-03 DIAGNOSIS — N6012 Diffuse cystic mastopathy of left breast: Secondary | ICD-10-CM | POA: Diagnosis not present

## 2020-08-06 ENCOUNTER — Other Ambulatory Visit: Payer: Self-pay | Admitting: Surgery

## 2020-08-06 DIAGNOSIS — N6019 Diffuse cystic mastopathy of unspecified breast: Secondary | ICD-10-CM

## 2020-08-15 ENCOUNTER — Ambulatory Visit
Admission: RE | Admit: 2020-08-15 | Discharge: 2020-08-15 | Disposition: A | Discharge status: Home / Self Care | Payer: Medicare HMO | Source: Ambulatory Visit | Attending: Surgery | Admitting: Surgery

## 2020-08-15 ENCOUNTER — Other Ambulatory Visit: Payer: Self-pay

## 2020-08-15 DIAGNOSIS — N6453 Retraction of nipple: Secondary | ICD-10-CM | POA: Diagnosis not present

## 2020-08-15 DIAGNOSIS — N6019 Diffuse cystic mastopathy of unspecified breast: Secondary | ICD-10-CM

## 2020-08-15 MED ORDER — GADOBUTROL 1 MMOL/ML IV SOLN
6.0000 mL | Freq: Once | INTRAVENOUS | Status: AC | PRN
  Administered 2020-08-15: 6 mL via INTRAVENOUS

## 2020-08-17 ENCOUNTER — Other Ambulatory Visit: Payer: Self-pay | Admitting: Surgery

## 2020-08-17 DIAGNOSIS — R9389 Abnormal findings on diagnostic imaging of other specified body structures: Secondary | ICD-10-CM

## 2020-08-23 ENCOUNTER — Ambulatory Visit
Admission: RE | Admit: 2020-08-23 | Discharge: 2020-08-23 | Disposition: A | Discharge status: Home / Self Care | Payer: Medicare HMO | Source: Ambulatory Visit | Attending: Surgery | Admitting: Surgery

## 2020-08-23 ENCOUNTER — Other Ambulatory Visit: Payer: Self-pay

## 2020-08-23 ENCOUNTER — Other Ambulatory Visit: Payer: Self-pay | Admitting: General Practice

## 2020-08-23 ENCOUNTER — Other Ambulatory Visit: Payer: Self-pay | Admitting: Surgery

## 2020-08-23 DIAGNOSIS — R9389 Abnormal findings on diagnostic imaging of other specified body structures: Secondary | ICD-10-CM

## 2020-08-23 DIAGNOSIS — N6311 Unspecified lump in the right breast, upper outer quadrant: Secondary | ICD-10-CM | POA: Diagnosis not present

## 2020-08-23 DIAGNOSIS — N6012 Diffuse cystic mastopathy of left breast: Secondary | ICD-10-CM | POA: Diagnosis not present

## 2020-08-23 DIAGNOSIS — R928 Other abnormal and inconclusive findings on diagnostic imaging of breast: Secondary | ICD-10-CM | POA: Diagnosis not present

## 2020-08-23 DIAGNOSIS — D241 Benign neoplasm of right breast: Secondary | ICD-10-CM | POA: Diagnosis not present

## 2020-08-23 MED ORDER — GADOBUTROL 1 MMOL/ML IV SOLN
5.0000 mL | Freq: Once | INTRAVENOUS | Status: AC | PRN
  Administered 2020-08-23: 5 mL via INTRAVENOUS

## 2020-08-24 HISTORY — PX: BREAST BIOPSY: SHX20

## 2020-08-31 DIAGNOSIS — U071 COVID-19: Secondary | ICD-10-CM | POA: Diagnosis not present

## 2020-09-01 DIAGNOSIS — Z20822 Contact with and (suspected) exposure to covid-19: Secondary | ICD-10-CM | POA: Diagnosis not present

## 2020-09-09 ENCOUNTER — Telehealth: Payer: Self-pay

## 2020-09-09 NOTE — Telephone Encounter (Signed)
Christina Cantrell called to report that she tested positive for covid on Jan. 28 after exposure from her Granddaughter.  She has had her covid vaccines and her booster.  She wants to schedule follow-up in the office because of continued cough and she wants to get labwork . She knows to go to the ED if symptoms worsen or if she develops problems breathing.

## 2020-09-14 DIAGNOSIS — N6012 Diffuse cystic mastopathy of left breast: Secondary | ICD-10-CM | POA: Diagnosis not present

## 2020-09-14 DIAGNOSIS — N6011 Diffuse cystic mastopathy of right breast: Secondary | ICD-10-CM | POA: Diagnosis not present

## 2020-09-15 ENCOUNTER — Telehealth: Payer: Self-pay | Admitting: Genetic Counselor

## 2020-09-15 ENCOUNTER — Other Ambulatory Visit: Payer: Self-pay | Admitting: General Practice

## 2020-09-15 DIAGNOSIS — N6011 Diffuse cystic mastopathy of right breast: Secondary | ICD-10-CM

## 2020-09-15 NOTE — Telephone Encounter (Signed)
Received a genetic counseling referral from [facility] in [place] for fhx of breast cancer. Christina Cantrell has been cld and scheduled to see Santiago Glad via [facility] video on 1/26 at 1pm. I sent a link to the pt's mobile phone to sign up to activate mychart.

## 2020-09-16 ENCOUNTER — Encounter: Payer: Self-pay | Admitting: Family Medicine

## 2020-09-16 ENCOUNTER — Ambulatory Visit (INDEPENDENT_AMBULATORY_CARE_PROVIDER_SITE_OTHER): Payer: Medicare HMO | Admitting: Family Medicine

## 2020-09-16 VITALS — BP 122/62 | HR 92 | Resp 18 | Ht 64.5 in | Wt 138.2 lb

## 2020-09-16 DIAGNOSIS — F32A Depression, unspecified: Secondary | ICD-10-CM | POA: Insufficient documentation

## 2020-09-16 DIAGNOSIS — R5383 Other fatigue: Secondary | ICD-10-CM | POA: Insufficient documentation

## 2020-09-16 DIAGNOSIS — L219 Seborrheic dermatitis, unspecified: Secondary | ICD-10-CM | POA: Diagnosis not present

## 2020-09-16 DIAGNOSIS — L719 Rosacea, unspecified: Secondary | ICD-10-CM | POA: Diagnosis not present

## 2020-09-16 DIAGNOSIS — Z780 Asymptomatic menopausal state: Secondary | ICD-10-CM

## 2020-09-16 DIAGNOSIS — G35 Multiple sclerosis: Secondary | ICD-10-CM | POA: Diagnosis not present

## 2020-09-16 HISTORY — DX: Other fatigue: R53.83

## 2020-09-16 HISTORY — DX: Depression, unspecified: F32.A

## 2020-09-16 NOTE — Progress Notes (Signed)
Established Patient Office Visit  Subjective:  Patient ID: Christina Cantrell, female    DOB: May 23, 1961  Age: 60 y.o. MRN: [id number]  CC: MS/  HPI Christina Cantrell presents for  Breast masses-recent biopsy-MRI-breast  PMH-MS-diagnosis at 17-neurologist -w/i last 20 years started-oxycodone 10/325-4 max /day, and  fentanyl patch. Use of pain medication has allowed pt to walk again-pt has not worked since 1991 now has-limited vision-, cognitive decline-word finding and concentration  Nerve pain causing decrease in ADL.-pain management-in wheelchair for 5 years-nerve block did not work-pt does not want morphine pump.Fentanyl and oxycodone.  IBS-constipation/abdominal spasms-bentyl and citracel-colonoscopy 2017-miralax suggested by GI.   Central sleep apnea-pt trial of CPAP did not work-pt using provigil-given by pain management,   RLS-likely due to-MS-flexeril-nerve dysfunction  Stress test-murmur-pt unable to complete stress test due to mobility needed injection-normal  Breast MRI-breast biopsies -negative-recommended MRI q 6 months, genetic testing  Braces- root canal   Past Surgical History:  Procedure Laterality Date  . BREAST EXCISIONAL BIOPSY Right 1975     Pt and husband cared for mother -died 67-COPD related Social History   Socioeconomic History  . Marital status: Married    Spouse name: Not on file  . Number of children: Not on file  . Years of education: Not on file  . Highest education level: Not on file  Occupational History  . Not on file  Tobacco Use  . Smoking status: Former Smoker    Years: 20.00    Types: Cigarettes    Quit date: 2018    Years since quitting: 4.0  . Smokeless tobacco: Never Used  Substance and Sexual Activity  . Alcohol use: Never  . Drug use: Never  . Sexual activity: Not on file  Other Topics Concern  . Not on file  Social History Narrative  . Not on file   Social Determinants of Health   Financial Resource Strain: Not on  file  Food Insecurity: Not on file  Transportation Needs: Not on file  Physical Activity: Not on file  Stress: Not on file  Social Connections: Not on file  Intimate Partner Violence: Not on file    Outpatient Medications Prior to Visit  Medication Sig Dispense Refill  . albuterol (VENTOLIN HFA) 108 (90 Base) MCG/ACT inhaler Inhale into the lungs.    . citalopram (CELEXA) 40 MG tablet     . cyclobenzaprine (FLEXERIL) 10 MG tablet     . dicyclomine (BENTYL) 10 MG capsule Take by mouth.    . fentaNYL (DURAGESIC) 75 MCG/HR     . modafinil (PROVIGIL) 100 MG tablet Take by mouth.    . norethindrone-ethinyl estradiol (FEMHRT 1/5) 1-5 MG-MCG TABS tablet     . oxyCODONE-acetaminophen (PERCOCET) 10-325 MG tablet     . atorvastatin (LIPITOR) 20 MG tablet Take 20 mg by mouth daily.     No facility-administered medications prior to visit.    Allergies  Allergen Reactions  . Sertraline Hcl Hives    ROS Review of Systems  Constitutional: Negative for fatigue and fever.  Eyes: Positive for visual disturbance.  Respiratory: Negative.   Cardiovascular: Negative.   Gastrointestinal: Positive for abdominal distention.  Endocrine: Negative.   Musculoskeletal: Positive for arthralgias and gait problem.  Neurological: Negative for headaches.  Hematological: Negative.   Psychiatric/Behavioral: Positive for decreased concentration.      Objective:    Physical Exam Vitals reviewed.  Constitutional:      Appearance: Normal appearance.  HENT:     Head:  Normocephalic and atraumatic.  Eyes:     Conjunctiva/sclera: Conjunctivae normal.  Cardiovascular:     Rate and Rhythm: Normal rate and regular rhythm.     Pulses: Normal pulses.     Heart sounds: Normal heart sounds.  Pulmonary:     Effort: Pulmonary effort is normal.     Breath sounds: Normal breath sounds.  Musculoskeletal:     Cervical back: Normal range of motion and neck supple.  Neurological:     Mental Status: She is alert.      BP 122/62   Pulse 92   Resp 18   Ht 5' 4.5" (1.638 m)   Wt 138 lb 3.2 oz (62.7 kg)   SpO2 99%   BMI 23.36 kg/m  Wt Readings from Last 3 Encounters:  09/16/20 138 lb 3.2 oz (62.7 kg)     Health Maintenance Due  Topic Date Due  . Hepatitis C Screening  Never done  . HIV Screening  Never done  . TETANUS/TDAP  Never done  . PAP SMEAR-Modifier  Never done  . COLONOSCOPY (Pts 45-106yr Insurance coverage will need to be confirmed)  Never done  . INFLUENZA VACCINE  04/04/2020     Assessment & Plan:  1. Fatigue,likely related to MS-pt agrees to baseline labwork - CBC with Differential - CMP14+EGFR - Lipid Panel - TSH  2. Multiple sclerosis (HCC)-followed by neurology - CBC with Differential - CMP14+EGFR - Lipid Panel - TSH - Ambulatory referral to Psychology  3. Depression, unspecified depression type- TSH Follow-up: labwork, psy D/w pt and husband-with decrease with energy level pt resistant to activities with family. Improved mood may improve participation with family 449m-talked with pt and husband about diagnosis, activities, pain management, neurology  Christina Cantrell LEHannah BeatMD

## 2020-09-16 NOTE — Patient Instructions (Signed)
[email or web address]">  Depression Screening Depression screening is a tool that your health care provider can use to learn if you have symptoms of depression. Depression is a common condition with many symptoms that are also often found in other conditions. Depression is treatable, but it must first be diagnosed. You may not know that certain feelings, thoughts, and behaviors that you are having can be symptoms of depression. Taking a depression screening test can help you and your health care provider decide if you need more assessment, or if you should be referred to a mental health care provider. What are the screening tests?  You may have a physical exam to see if another condition is affecting your mental health. You may have a blood or urine sample taken during the physical exam.  You may be interviewed using a screening tool that was developed from research, such as one of these: ? Patient Health Questionnaire (PHQ). This is a set of either 2 or 9 questions. A health care provider who has been trained to score this screening test uses a guide to assess if your symptoms suggest that you may have depression. ? Hamilton Depression Rating Scale (HAM-D). This is a set of either 17 or 24 questions. You may be asked to take it again during or after your treatment, to see if your depression has gotten better. ? Beck Depression Inventory (BDI). This is a set of 21 multiple choice questions. Your health care provider scores your answers to assess:  Your level of depression, ranging from mild to severe.  Your response to treatment.  Your health care provider may talk with you about your daily activities, such as eating, sleeping, work, and recreation, and ask if you have had any changes in activity.  Your health care provider may ask you to see a mental health specialist, such as a psychiatrist or psychologist, for more  evaluation. Who should be screened for depression?  All adults, including adults with a family history of a mental health disorder.  Adolescents who are 58-58 years old.  People who are recovering from a myocardial infarction (MI).  Pregnant women, or women who have given birth.  People who have a long-term (chronic) illness.  Anyone who has been diagnosed with another type of a mental health disorder.  Anyone who has symptoms that could show depression.   What do my results mean? Your health care provider will review the results of your depression screening, physical exam, and lab tests. Positive screens suggest that you may have depression. Screening is the first step in getting the care that you may need. It is up to you to get your screening results. Ask your health care provider, or the department that is doing your screening tests, when your results will be ready. Talk with your health care provider about your results and diagnosis. A diagnosis of depression is made using the Diagnostic and Statistical Manual of Mental Disorders (DSM-V). This is a book that lists the number and type of symptoms that must be present for a health care provider to give a specific diagnosis.  Your health care provider may work with you to treat your symptoms of depression, or your health care provider may help you find a mental health provider who can assess, diagnose, and treat your depression. Get help right away if:  You have thoughts about hurting yourself or others. If you ever feel like you may hurt yourself or others, or have thoughts about taking your own life, get help  right away. You can go to your nearest emergency department or call:  Your local emergency services (911 in the [place]  A suicide crisis helpline, such as the [facility] at (779)432-9194. This is open 24 hours a day. Summary  Depression screening is the first step in getting the help that you may  need.  If your screening test shows symptoms of depression (is positive), your health care provider may ask you to see a mental health provider.  Anyone who is age 58 or older should be screened for depression. This information is not intended to replace advice given to you by your health care provider. Make sure you discuss any questions you have with your health care provider. Document Revised: 02/12/2020 Document Reviewed: 02/12/2020 Elsevier Patient Education  [place].

## 2020-09-17 LAB — TSH: TSH: 1.4 u[IU]/mL (ref 0.450–4.500)

## 2020-09-17 LAB — CBC WITH DIFFERENTIAL/PLATELET
Basophils Absolute: 0 10*3/uL (ref 0.0–0.2)
Basos: 1 %
EOS (ABSOLUTE): 0 10*3/uL (ref 0.0–0.4)
Eos: 0 %
Hematocrit: 41.7 % (ref 34.0–46.6)
Hemoglobin: 14 g/dL (ref 11.1–15.9)
Immature Grans (Abs): 0 10*3/uL (ref 0.0–0.1)
Immature Granulocytes: 0 %
Lymphocytes Absolute: 1.4 10*3/uL (ref 0.7–3.1)
Lymphs: 23 %
MCH: 30.9 pg (ref 26.6–33.0)
MCHC: 33.6 g/dL (ref 31.5–35.7)
MCV: 92 fL (ref 79–97)
Monocytes Absolute: 0.3 10*3/uL (ref 0.1–0.9)
Monocytes: 4 %
Neutrophils Absolute: 4.4 10*3/uL (ref 1.4–7.0)
Neutrophils: 72 %
Platelets: 397 10*3/uL (ref 150–450)
RBC: 4.53 x10E6/uL (ref 3.77–5.28)
RDW: 11.9 % (ref 11.7–15.4)
WBC: 6.1 10*3/uL (ref 3.4–10.8)

## 2020-09-17 LAB — CMP14+EGFR
ALT: 8 IU/L (ref 0–32)
AST: 12 IU/L (ref 0–40)
Albumin/Globulin Ratio: 1.8 (ref 1.2–2.2)
Albumin: 4.5 g/dL (ref 3.8–4.9)
Alkaline Phosphatase: 70 IU/L (ref 44–121)
BUN/Creatinine Ratio: 11 (ref 9–23)
BUN: 9 mg/dL (ref 6–24)
Bilirubin Total: 0.3 mg/dL (ref 0.0–1.2)
CO2: 22 mmol/L (ref 20–29)
Calcium: 9.6 mg/dL (ref 8.7–10.2)
Chloride: 105 mmol/L (ref 96–106)
Creatinine, Ser: 0.81 mg/dL (ref 0.57–1.00)
GFR calc Af Amer: 93 mL/min/{1.73_m2} (ref >59)
GFR calc non Af Amer: 81 mL/min/{1.73_m2} (ref >59)
Globulin, Total: 2.5 g/dL (ref 1.5–4.5)
Glucose: 97 mg/dL (ref 65–99)
Potassium: 4.6 mmol/L (ref 3.5–5.2)
Sodium: 142 mmol/L (ref 134–144)
Total Protein: 7 g/dL (ref 6.0–8.5)

## 2020-09-17 LAB — LIPID PANEL
Chol/HDL Ratio: 4.5 ratio — ABNORMAL HIGH (ref 0.0–4.4)
Cholesterol, Total: 282 mg/dL — ABNORMAL HIGH (ref 100–199)
HDL: 63 mg/dL (ref >39)
LDL Chol Calc (NIH): 203 mg/dL — ABNORMAL HIGH (ref 0–99)
Triglycerides: 95 mg/dL (ref 0–149)
VLDL Cholesterol Cal: 16 mg/dL (ref 5–40)

## 2020-09-17 LAB — CARDIOVASCULAR RISK ASSESSMENT

## 2020-09-21 ENCOUNTER — Telehealth (INDEPENDENT_AMBULATORY_CARE_PROVIDER_SITE_OTHER): Payer: Medicare HMO | Admitting: Family Medicine

## 2020-09-21 ENCOUNTER — Encounter: Payer: Self-pay | Admitting: Family Medicine

## 2020-09-21 VITALS — BP 118/62 | HR 81 | Ht 65.0 in | Wt 137.0 lb

## 2020-09-21 DIAGNOSIS — F411 Generalized anxiety disorder: Secondary | ICD-10-CM | POA: Diagnosis not present

## 2020-09-21 DIAGNOSIS — M542 Cervicalgia: Secondary | ICD-10-CM | POA: Diagnosis not present

## 2020-09-21 DIAGNOSIS — G4733 Obstructive sleep apnea (adult) (pediatric): Secondary | ICD-10-CM

## 2020-09-21 DIAGNOSIS — N3942 Incontinence without sensory awareness: Secondary | ICD-10-CM | POA: Diagnosis not present

## 2020-09-21 DIAGNOSIS — E782 Mixed hyperlipidemia: Secondary | ICD-10-CM | POA: Diagnosis not present

## 2020-09-21 DIAGNOSIS — R5383 Other fatigue: Secondary | ICD-10-CM

## 2020-09-21 DIAGNOSIS — J432 Centrilobular emphysema: Secondary | ICD-10-CM

## 2020-09-21 DIAGNOSIS — L719 Rosacea, unspecified: Secondary | ICD-10-CM | POA: Diagnosis not present

## 2020-09-21 DIAGNOSIS — G35 Multiple sclerosis: Secondary | ICD-10-CM | POA: Diagnosis not present

## 2020-09-21 DIAGNOSIS — I700 Atherosclerosis of aorta: Secondary | ICD-10-CM

## 2020-09-21 DIAGNOSIS — F1120 Opioid dependence, uncomplicated: Secondary | ICD-10-CM

## 2020-09-21 MED ORDER — ROSUVASTATIN CALCIUM 20 MG PO TABS
20.0000 mg | ORAL_TABLET | Freq: Every day | ORAL | 0 refills | Status: DC
Start: 2020-09-21 — End: 2020-09-22

## 2020-09-21 NOTE — Progress Notes (Signed)
Virtual Visit via Video Note   This visit type was conducted due to national recommendations for restrictions regarding the COVID-19 Pandemic (e.g. social distancing) in an effort to limit this patient's exposure and mitigate transmission in our community.  Due to her co-morbid illnesses, this patient is at least at moderate risk for complications without adequate follow up.  This format is felt to be most appropriate for this patient at this time.  All issues noted in this document were discussed and addressed.  A limited physical exam was performed with this format.  A verbal consent was obtained for the virtual visit.   Patient Location:home Provider Location:office Evaluation Performed:  Follow-Up Visit  Subjective:  Patient ID: Christina Cantrell, female    DOB: [birth year]  Age: 60 y.o. MRN: [id number]  Chief Complaint  Patient presents with  . Hyperlipidemia    HPI Hyperlipidemia: LDL 203. Trigs and hdl good.  Patient reports she has never been on a statin.  She is always been reluctant to try this.  Patient denies eating a lot of fried foods however they do snack a lot.  She does not exercise. Multiple sclerosis dxd in 1991. Optic neuritis when she was 60 yo. Had foot drop. Patient is seen at [facility] for 3 years, tried on numerous medicines. Now sees [facility] and sees [facility], Justine Null, NP. Tried nerve blocks. On Fentanyl patch 75 mcg every 48 hours. Percocet 10/325 mg one four times a day prn severe breakthrough pain. Uses Percocet maybe 1-2 times per week.  Patient has bladder incontinence.  She has no idea when she needs to go.  Bowel movements are good. Depression: on citalopram 40 mg once daily for 20 years. Narcotics make her itch, so this was given to her and it helps.  OSA: on provigil. VPAP tried. Saw Dr. Alcide Clever. Has had 4 sleep studies and has severe OSA. Sleeps sitting up to the side. Was not able to tolerate PAP. Her mask would blow of her face.  Rosacea:  Patient has seen dermatology who is put her on doxycycline and MetroGel.  She was also instructed to use baby shampoo on her eye lids which is helping very much. Abnormal mammogram followed by an abnormal MRI.  She underwent genetic counseling and is waiting to hear these results.  She is scheduled to repeat an MRI in 6 months.  She sees the gynecologist: Dr. Ouida Sills.  Current Outpatient Medications on File Prior to Visit  Medication Sig Dispense Refill  . albuterol (VENTOLIN HFA) 108 (90 Base) MCG/ACT inhaler Inhale into the lungs.    . citalopram (CELEXA) 40 MG tablet     . clobetasol (TEMOVATE) 0.05 % external solution Apply topically.    . cyclobenzaprine (FLEXERIL) 10 MG tablet     . dicyclomine (BENTYL) 10 MG capsule Take by mouth.    . doxycycline (PERIOSTAT) 20 MG tablet Take 20 mg by mouth 2 (two) times daily.    . fentaNYL (DURAGESIC) 75 MCG/HR     . modafinil (PROVIGIL) 100 MG tablet Take by mouth.    . norethindrone-ethinyl estradiol (FEMHRT 1/5) 1-5 MG-MCG TABS tablet     . oxyCODONE-acetaminophen (PERCOCET) 10-325 MG tablet      No current facility-administered medications on file prior to visit.   Past Medical History:  Diagnosis Date  . Multiple sclerosis ([place]) 1991   Age 71 yo   Past Surgical History:  Procedure Laterality Date  . BREAST EXCISIONAL BIOPSY Right 1975  No family history on file. Social History   Socioeconomic History  . Marital status: Married    Spouse name: Not on file  . Number of children: Not on file  . Years of education: Not on file  . Highest education level: Not on file  Occupational History  . Not on file  Tobacco Use  . Smoking status: Former Smoker    Years: 20.00    Types: Cigarettes    Quit date: 2018    Years since quitting: 4.0  . Smokeless tobacco: Never Used  Substance and Sexual Activity  . Alcohol use: Never  . Drug use: Never  . Sexual activity: Not on file  Other Topics Concern  . Not on file  Social History  Narrative  . Not on file   Social Determinants of Health   Financial Resource Strain: Not on file  Food Insecurity: Not on file  Transportation Needs: Not on file  Physical Activity: Not on file  Stress: Not on file  Social Connections: Not on file    Review of Systems  Constitutional: Negative for chills, fatigue and fever.  HENT: Positive for ear pain. Negative for congestion, rhinorrhea and sore throat.   Respiratory: Negative for cough and shortness of breath.   Cardiovascular: Negative for chest pain.  Gastrointestinal: Negative for abdominal pain, constipation, diarrhea, nausea and vomiting.  Genitourinary: Negative for dysuria and urgency.  Musculoskeletal: Positive for myalgias (leg spasms). Negative for back pain.  Neurological: Negative for dizziness, weakness, light-headedness and headaches.  Psychiatric/Behavioral: Negative for dysphoric mood. The patient is not nervous/anxious.      Objective:  BP 118/62   Pulse 81   Ht 5\' 5"  (1.651 m)   Wt 137 lb (62.1 kg)   SpO2 98%   BMI 22.80 kg/m   BP/Weight 09/21/2020 6/38/7564  Systolic BP 332 951  Diastolic BP 62 62  Wt. (Lbs) 137 138.2  BMI 22.8 23.36    Physical Exam Patient appears well. No other exam done. Diabetic Foot Exam - Simple   No data filed      Lab Results  Component Value Date   WBC 6.1 09/16/2020   HGB 14.0 09/16/2020   HCT 41.7 09/16/2020   PLT 397 09/16/2020   GLUCOSE 97 09/16/2020   CHOL 282 (H) 09/16/2020   TRIG 95 09/16/2020   HDL 63 09/16/2020   LDLCALC 203 (H) 09/16/2020   ALT 8 09/16/2020   AST 12 09/16/2020   NA 142 09/16/2020   K 4.6 09/16/2020   CL 105 09/16/2020   CREATININE 0.81 09/16/2020   BUN 9 09/16/2020   CO2 22 09/16/2020   TSH 1.400 09/16/2020      Assessment & Plan:   1. Mixed hyperlipidemia Start Crestor 20 mg once daily. Low-fat diet.  Recommend exercise if able.  2. Fatigue, unspecified type Secondary to multiple sclerosis as well as untreated  OSA.  3. Multiple sclerosis ([place]) Defer management to neurology.  4. Urinary incontinence without sensory awareness Secondary to multiple sclerosis.  5. Neck pain Defer management of pain to pain clinic.  6. GAD (generalized anxiety disorder) Continue citalopram 40 mg once daily.  Although this was started for pruritus it has helped with her anxiety.  7. Rosacea Continue current medications.  Defer management to dermatology.    8. OSA (obstructive sleep apnea) Continue provigil Intolerant to CPAP/VPAP.    9. Centrilobular emphysema ([facility]) Refill of albuterol.  May benefit from maintenance medicine we will discuss at her next visit.  10. Atherosclerosis of aorta ([place]) Start on Crestor 20 mg once daily.  I did not discuss aspirin use however I would like the patient to work with our pharmacist, Lysle Rubens, PhD on chronic care management.  11. Uncomplicated opioid dependence ([place]) Defer management to pain clinic.  Meds ordered this encounter  Medications  . DISCONTD: rosuvastatin (CRESTOR) 20 MG tablet    Sig: Take 1 tablet (20 mg total) by mouth daily.    Dispense:  90 tablet    Refill:  0     Time:  Today, I have spent 45 minutes with the patient with telehealth technology discussing the above problems.  Patient is high risk for Covid complications and did not want to come to the office.  I reviewed all of her labs with she and her husband.  Follow Up:  In Person in 3 month(s) fasting  An After Visit Summary was printed and given to the patient.  Rochel Brome, MD [facility] [phone]

## 2020-09-22 ENCOUNTER — Other Ambulatory Visit: Payer: Self-pay

## 2020-09-22 DIAGNOSIS — E782 Mixed hyperlipidemia: Secondary | ICD-10-CM

## 2020-09-22 MED ORDER — ROSUVASTATIN CALCIUM 20 MG PO TABS: 20.0000 mg | ORAL_TABLET | Freq: Every day | ORAL | 0 refills | Status: DC

## 2020-09-29 ENCOUNTER — Inpatient Hospital Stay: Payer: Medicare HMO | Admitting: Genetic Counselor

## 2020-09-29 DIAGNOSIS — H04123 Dry eye syndrome of bilateral lacrimal glands: Secondary | ICD-10-CM | POA: Diagnosis not present

## 2020-10-05 DIAGNOSIS — G35 Multiple sclerosis: Secondary | ICD-10-CM | POA: Diagnosis not present

## 2020-10-05 DIAGNOSIS — M792 Neuralgia and neuritis, unspecified: Secondary | ICD-10-CM | POA: Diagnosis not present

## 2020-10-05 DIAGNOSIS — M542 Cervicalgia: Secondary | ICD-10-CM | POA: Diagnosis not present

## 2020-10-06 ENCOUNTER — Encounter: Payer: Self-pay | Admitting: Family Medicine

## 2020-10-06 MED ORDER — ALBUTEROL SULFATE HFA 108 (90 BASE) MCG/ACT IN AERS
2.0000 | INHALATION_SPRAY | Freq: Four times a day (QID) | RESPIRATORY_TRACT | 1 refills | Status: DC | PRN
Start: 2020-10-06 — End: 2021-10-04

## 2020-10-29 DIAGNOSIS — H04123 Dry eye syndrome of bilateral lacrimal glands: Secondary | ICD-10-CM | POA: Diagnosis not present

## 2020-12-17 ENCOUNTER — Other Ambulatory Visit: Payer: Self-pay | Admitting: Family Medicine

## 2020-12-17 DIAGNOSIS — E782 Mixed hyperlipidemia: Secondary | ICD-10-CM

## 2020-12-28 ENCOUNTER — Ambulatory Visit (INDEPENDENT_AMBULATORY_CARE_PROVIDER_SITE_OTHER): Payer: Medicare HMO | Admitting: Family Medicine

## 2020-12-28 ENCOUNTER — Other Ambulatory Visit: Payer: Self-pay

## 2020-12-28 VITALS — BP 110/64 | HR 84 | Temp 97.7°F | Resp 14 | Ht 65.0 in | Wt 136.0 lb

## 2020-12-28 DIAGNOSIS — E559 Vitamin D deficiency, unspecified: Secondary | ICD-10-CM | POA: Diagnosis not present

## 2020-12-28 DIAGNOSIS — E782 Mixed hyperlipidemia: Secondary | ICD-10-CM | POA: Diagnosis not present

## 2020-12-28 DIAGNOSIS — M816 Localized osteoporosis [Lequesne]: Secondary | ICD-10-CM

## 2020-12-28 DIAGNOSIS — G894 Chronic pain syndrome: Secondary | ICD-10-CM

## 2020-12-28 DIAGNOSIS — F330 Major depressive disorder, recurrent, mild: Secondary | ICD-10-CM

## 2020-12-28 DIAGNOSIS — G35 Multiple sclerosis: Secondary | ICD-10-CM | POA: Diagnosis not present

## 2020-12-28 DIAGNOSIS — R5383 Other fatigue: Secondary | ICD-10-CM | POA: Diagnosis not present

## 2020-12-28 DIAGNOSIS — F1120 Opioid dependence, uncomplicated: Secondary | ICD-10-CM

## 2020-12-28 DIAGNOSIS — F32A Depression, unspecified: Secondary | ICD-10-CM

## 2020-12-28 DIAGNOSIS — H539 Unspecified visual disturbance: Secondary | ICD-10-CM | POA: Diagnosis not present

## 2020-12-28 NOTE — Progress Notes (Signed)
Subjective:  Patient ID: Christina Cantrell, female    DOB: 12/02/1960  Age: 60 y.o. MRN: [id number]  Chief Complaint  Patient presents with  . Hyperlipidemia    HPI Patient presents for follow-up of hyperlipidemia.  Her LDL was over 200 at her last visit.  She is taking Crestor and tolerating it well. Patient has numerous issues with her eyes including glaucoma.  She has had a decrease in her vision in her left eye over the last 3 to 4 days.  She was concerned it was possibly her multiple sclerosis but was not sure. Multiple sclerosis otherwise been stable.   She has chronic pain syndrome and is treated by pain clinic. Patient also has a history of osteoporosis.  She was recommended to take Prolia shots over 2 years ago but due to [place] she had not followed this up.  She recently saw the dentist who was very concerned because he was easily able to drill through her sinuses.  He put her on vitamin D 10,000 units daily. She is due for dexa. Depression: pt has been on citalopram for years. PHQ9 = 0.  Current Outpatient Medications on File Prior to Visit  Medication Sig Dispense Refill  . albuterol (VENTOLIN HFA) 108 (90 Base) MCG/ACT inhaler Inhale 2 puffs into the lungs every 6 (six) hours as needed for wheezing or shortness of breath. 1 each 1  . citalopram (CELEXA) 40 MG tablet     . clobetasol (TEMOVATE) 0.05 % external solution Apply topically.    . cyclobenzaprine (FLEXERIL) 10 MG tablet     . dicyclomine (BENTYL) 10 MG capsule Take by mouth.    . doxycycline (PERIOSTAT) 20 MG tablet Take 20 mg by mouth 2 (two) times daily.    . fentaNYL (DURAGESIC) 75 MCG/HR     . modafinil (PROVIGIL) 100 MG tablet Take by mouth.    . norethindrone-ethinyl estradiol (FEMHRT 1/5) 1-5 MG-MCG TABS tablet     . oxyCODONE-acetaminophen (PERCOCET) 10-325 MG tablet     . rosuvastatin (CRESTOR) 20 MG tablet TAKE 1 TABLET BY MOUTH EVERY DAY 90 tablet 0   No current facility-administered medications on file  prior to visit.   Past Medical History:  Diagnosis Date  . Multiple sclerosis ([place]) 1991   Age 7 yo   Past Surgical History:  Procedure Laterality Date  . BREAST EXCISIONAL BIOPSY Right 1975    No family history on file. Social History   Socioeconomic History  . Marital status: Married    Spouse name: Not on file  . Number of children: Not on file  . Years of education: Not on file  . Highest education level: Not on file  Occupational History  . Not on file  Tobacco Use  . Smoking status: Former Smoker    Years: 20.00    Types: Cigarettes    Quit date: 2018    Years since quitting: 4.3  . Smokeless tobacco: Never Used  Substance and Sexual Activity  . Alcohol use: Never  . Drug use: Never  . Sexual activity: Not on file  Other Topics Concern  . Not on file  Social History Narrative  . Not on file   Social Determinants of Health   Financial Resource Strain: Not on file  Food Insecurity: Not on file  Transportation Needs: Not on file  Physical Activity: Not on file  Stress: Not on file  Social Connections: Not on file    Review of Systems  Constitutional: Positive for  fatigue. Negative for chills and fever.  HENT: Negative for congestion, ear pain and sore throat.   Eyes: Positive for visual disturbance.  Respiratory: Negative for cough and shortness of breath.   Cardiovascular: Negative for chest pain.  Gastrointestinal: Negative for abdominal pain, constipation, diarrhea, nausea and vomiting.  Genitourinary: Negative for dysuria and urgency.  Musculoskeletal: Positive for back pain. Negative for arthralgias and myalgias.  Skin: Negative for rash.  Neurological: Negative for dizziness and headaches.  Psychiatric/Behavioral: Negative for dysphoric mood. The patient is not nervous/anxious.      Objective:  BP 110/64   Pulse 84   Temp 97.7 F (36.5 C)   Resp 14   Ht 5\' 5"  (1.651 m)   Wt 136 lb (61.7 kg)   BMI 22.63 kg/m   BP/Weight 12/28/2020  09/21/2020 5/64/3329  Systolic BP 518 841 660  Diastolic BP 64 62 62  Wt. (Lbs) 136 137 138.2  BMI 22.63 22.8 23.36    Physical Exam Vitals reviewed.  Constitutional:      Appearance: Normal appearance. She is normal weight.  Eyes:     General: Lids are normal.  Neck:     Vascular: No carotid bruit.  Cardiovascular:     Rate and Rhythm: Normal rate and regular rhythm.     Heart sounds: Normal heart sounds.  Pulmonary:     Effort: Pulmonary effort is normal. No respiratory distress.     Breath sounds: Normal breath sounds.  Abdominal:     General: Abdomen is flat. Bowel sounds are normal.     Palpations: Abdomen is soft.     Tenderness: There is no abdominal tenderness.  Neurological:     Mental Status: She is alert and oriented to person, place, and time.  Psychiatric:        Mood and Affect: Mood normal.        Behavior: Behavior normal.     Diabetic Foot Exam - Simple   No data filed      Lab Results  Component Value Date   WBC 6.3 12/28/2020   HGB 13.5 12/28/2020   HCT 40.6 12/28/2020   PLT 351 12/28/2020   GLUCOSE 94 12/28/2020   CHOL 170 12/28/2020   TRIG 66 12/28/2020   HDL 65 12/28/2020   LDLCALC 92 12/28/2020   ALT 7 12/28/2020   AST 12 12/28/2020   NA 141 12/28/2020   K 4.1 12/28/2020   CL 103 12/28/2020   CREATININE 0.84 12/28/2020   BUN 9 12/28/2020   CO2 22 12/28/2020   TSH 1.400 09/16/2020      Assessment & Plan:   1. Mixed hyperlipidemia Well controlled.  No changes to medicines.  Continue to work on eating a healthy diet and exercise.  Labs drawn today.  - Lipid panel  2. Vitamin D deficiency - VITAMIN D 25 Hydroxy (Vit-D Deficiency, Fractures)  3. Localized osteoporosis without current pathological fracture Needs DEXA, Pt to call gynecology to set up.   4. Depression, mild recurrent.  Stable on citalopram. The current medical regimen is effective;  continue present plan and medications.  5. Fatigue: Check labs. Likely  secondary to MS.  - CBC with Differential/Platelet - Comprehensive metabolic panel  6. Multiple sclerosis ([place]) Managed by neurology.  7. Chronic pain syndrome Managed by pain clinic  8. Uncomplicated opioid dependence ([place]) Managed by pain clinic  9. Vision disturbance Strongly recommended the patient call her ophthalmologist to see if she leaves to get an appointment.  I am  concerned she may have retinal detachment.  I informed her she was unable to get an appointment today then she should call us back and I will call her ophthalmologist.   Orders Placed This Encounter  Procedures  . VITAMIN D 25 Hydroxy (Vit-D Deficiency, Fractures)  . CBC with Differential/Platelet  . Comprehensive metabolic panel  . Lipid panel  . Cardiovascular Risk Assessment     Follow-up: No follow-ups on file.  An After Visit Summary was printed and given to the patient.  Rochel Brome, MD [facility] [phone]

## 2020-12-29 DIAGNOSIS — M5136 Other intervertebral disc degeneration, lumbar region: Secondary | ICD-10-CM | POA: Diagnosis not present

## 2020-12-29 DIAGNOSIS — G35 Multiple sclerosis: Secondary | ICD-10-CM | POA: Diagnosis not present

## 2020-12-29 DIAGNOSIS — M792 Neuralgia and neuritis, unspecified: Secondary | ICD-10-CM | POA: Diagnosis not present

## 2020-12-29 DIAGNOSIS — M542 Cervicalgia: Secondary | ICD-10-CM | POA: Diagnosis not present

## 2020-12-29 LAB — CBC WITH DIFFERENTIAL/PLATELET
Basophils Absolute: 0 10*3/uL (ref 0.0–0.2)
Basos: 1 %
EOS (ABSOLUTE): 0.1 10*3/uL (ref 0.0–0.4)
Eos: 2 %
Hematocrit: 40.6 % (ref 34.0–46.6)
Hemoglobin: 13.5 g/dL (ref 11.1–15.9)
Immature Grans (Abs): 0 10*3/uL (ref 0.0–0.1)
Immature Granulocytes: 0 %
Lymphocytes Absolute: 2.3 10*3/uL (ref 0.7–3.1)
Lymphs: 36 %
MCH: 30.7 pg (ref 26.6–33.0)
MCHC: 33.3 g/dL (ref 31.5–35.7)
MCV: 92 fL (ref 79–97)
Monocytes Absolute: 0.5 10*3/uL (ref 0.1–0.9)
Monocytes: 7 %
Neutrophils Absolute: 3.5 10*3/uL (ref 1.4–7.0)
Neutrophils: 54 %
Platelets: 351 10*3/uL (ref 150–450)
RBC: 4.4 x10E6/uL (ref 3.77–5.28)
RDW: 12.4 % (ref 11.7–15.4)
WBC: 6.3 10*3/uL (ref 3.4–10.8)

## 2020-12-29 LAB — COMPREHENSIVE METABOLIC PANEL
ALT: 7 IU/L (ref 0–32)
AST: 12 IU/L (ref 0–40)
Albumin/Globulin Ratio: 1.7 (ref 1.2–2.2)
Albumin: 4.3 g/dL (ref 3.8–4.9)
Alkaline Phosphatase: 54 IU/L (ref 44–121)
BUN/Creatinine Ratio: 11 (ref 9–23)
BUN: 9 mg/dL (ref 6–24)
Bilirubin Total: 0.4 mg/dL (ref 0.0–1.2)
CO2: 22 mmol/L (ref 20–29)
Calcium: 9.5 mg/dL (ref 8.7–10.2)
Chloride: 103 mmol/L (ref 96–106)
Creatinine, Ser: 0.84 mg/dL (ref 0.57–1.00)
Globulin, Total: 2.6 g/dL (ref 1.5–4.5)
Glucose: 94 mg/dL (ref 65–99)
Potassium: 4.1 mmol/L (ref 3.5–5.2)
Sodium: 141 mmol/L (ref 134–144)
Total Protein: 6.9 g/dL (ref 6.0–8.5)
eGFR: 81 mL/min/{1.73_m2} (ref >59)

## 2020-12-29 LAB — LIPID PANEL
Chol/HDL Ratio: 2.6 ratio (ref 0.0–4.4)
Cholesterol, Total: 170 mg/dL (ref 100–199)
HDL: 65 mg/dL (ref >39)
LDL Chol Calc (NIH): 92 mg/dL (ref 0–99)
Triglycerides: 66 mg/dL (ref 0–149)
VLDL Cholesterol Cal: 13 mg/dL (ref 5–40)

## 2020-12-29 LAB — CARDIOVASCULAR RISK ASSESSMENT

## 2020-12-29 LAB — VITAMIN D 25 HYDROXY (VIT D DEFICIENCY, FRACTURES): Vit D, 25-Hydroxy: 43.4 ng/mL (ref 30.0–100.0)

## 2020-12-30 DIAGNOSIS — H3412 Central retinal artery occlusion, left eye: Secondary | ICD-10-CM | POA: Diagnosis not present

## 2021-01-02 ENCOUNTER — Encounter: Payer: Self-pay | Admitting: Family Medicine

## 2021-01-13 ENCOUNTER — Other Ambulatory Visit: Payer: Self-pay | Admitting: Surgery

## 2021-01-13 ENCOUNTER — Other Ambulatory Visit: Payer: Self-pay | Admitting: Obstetrics and Gynecology

## 2021-01-13 DIAGNOSIS — Z1231 Encounter for screening mammogram for malignant neoplasm of breast: Secondary | ICD-10-CM

## 2021-01-26 DIAGNOSIS — L719 Rosacea, unspecified: Secondary | ICD-10-CM | POA: Diagnosis not present

## 2021-01-26 DIAGNOSIS — L309 Dermatitis, unspecified: Secondary | ICD-10-CM | POA: Diagnosis not present

## 2021-01-27 DIAGNOSIS — H26493 Other secondary cataract, bilateral: Secondary | ICD-10-CM | POA: Diagnosis not present

## 2021-02-11 ENCOUNTER — Other Ambulatory Visit: Payer: Self-pay | Admitting: Family Medicine

## 2021-02-11 DIAGNOSIS — E782 Mixed hyperlipidemia: Secondary | ICD-10-CM

## 2021-02-23 ENCOUNTER — Ambulatory Visit
Admission: RE | Admit: 2021-02-23 | Discharge: 2021-02-23 | Disposition: A | Discharge status: Home / Self Care | Payer: Medicare HMO | Source: Ambulatory Visit | Attending: Surgery | Admitting: Surgery

## 2021-02-23 ENCOUNTER — Other Ambulatory Visit: Payer: Self-pay

## 2021-02-23 DIAGNOSIS — Z1231 Encounter for screening mammogram for malignant neoplasm of breast: Secondary | ICD-10-CM

## 2021-02-28 ENCOUNTER — Ambulatory Visit
Admission: RE | Admit: 2021-02-28 | Discharge: 2021-02-28 | Disposition: A | Discharge status: Home / Self Care | Payer: Medicare HMO | Source: Ambulatory Visit | Attending: General Practice | Admitting: General Practice

## 2021-02-28 ENCOUNTER — Other Ambulatory Visit: Payer: Self-pay

## 2021-02-28 DIAGNOSIS — R928 Other abnormal and inconclusive findings on diagnostic imaging of breast: Secondary | ICD-10-CM | POA: Diagnosis not present

## 2021-02-28 DIAGNOSIS — Z803 Family history of malignant neoplasm of breast: Secondary | ICD-10-CM | POA: Diagnosis not present

## 2021-02-28 DIAGNOSIS — N6011 Diffuse cystic mastopathy of right breast: Secondary | ICD-10-CM

## 2021-02-28 MED ORDER — GADOBUTROL 1 MMOL/ML IV SOLN
6.0000 mL | Freq: Once | INTRAVENOUS | Status: AC | PRN
  Administered 2021-02-28: 16:00:00 6 mL via INTRAVENOUS

## 2021-03-09 ENCOUNTER — Ambulatory Visit: Payer: Medicare HMO

## 2021-03-11 ENCOUNTER — Other Ambulatory Visit: Payer: Self-pay | Admitting: Surgery

## 2021-03-11 DIAGNOSIS — Z1231 Encounter for screening mammogram for malignant neoplasm of breast: Secondary | ICD-10-CM

## 2021-03-11 DIAGNOSIS — N6011 Diffuse cystic mastopathy of right breast: Secondary | ICD-10-CM | POA: Diagnosis not present

## 2021-03-11 DIAGNOSIS — N6012 Diffuse cystic mastopathy of left breast: Secondary | ICD-10-CM | POA: Diagnosis not present

## 2021-03-30 DIAGNOSIS — M5136 Other intervertebral disc degeneration, lumbar region: Secondary | ICD-10-CM | POA: Diagnosis not present

## 2021-03-30 DIAGNOSIS — M542 Cervicalgia: Secondary | ICD-10-CM | POA: Diagnosis not present

## 2021-03-30 DIAGNOSIS — G35 Multiple sclerosis: Secondary | ICD-10-CM | POA: Diagnosis not present

## 2021-03-30 DIAGNOSIS — M792 Neuralgia and neuritis, unspecified: Secondary | ICD-10-CM | POA: Diagnosis not present

## 2021-05-11 DIAGNOSIS — L719 Rosacea, unspecified: Secondary | ICD-10-CM | POA: Diagnosis not present

## 2021-05-29 ENCOUNTER — Other Ambulatory Visit: Payer: Self-pay | Admitting: Family Medicine

## 2021-05-29 DIAGNOSIS — E782 Mixed hyperlipidemia: Secondary | ICD-10-CM

## 2021-06-07 ENCOUNTER — Institutional Professional Consult (permissible substitution): Payer: Medicare HMO | Admitting: Plastic Surgery

## 2021-06-21 ENCOUNTER — Other Ambulatory Visit: Payer: Self-pay | Admitting: Family Medicine

## 2021-06-21 DIAGNOSIS — E782 Mixed hyperlipidemia: Secondary | ICD-10-CM

## 2021-06-27 ENCOUNTER — Encounter: Payer: Self-pay | Admitting: Family Medicine

## 2021-06-27 ENCOUNTER — Ambulatory Visit (INDEPENDENT_AMBULATORY_CARE_PROVIDER_SITE_OTHER): Payer: Medicare HMO | Admitting: Family Medicine

## 2021-06-27 ENCOUNTER — Other Ambulatory Visit: Payer: Self-pay

## 2021-06-27 VITALS — BP 110/60 | HR 88 | Temp 97.8°F | Resp 16 | Ht 64.0 in | Wt 136.0 lb

## 2021-06-27 DIAGNOSIS — G35 Multiple sclerosis: Secondary | ICD-10-CM | POA: Diagnosis not present

## 2021-06-27 DIAGNOSIS — E7849 Other hyperlipidemia: Secondary | ICD-10-CM | POA: Insufficient documentation

## 2021-06-27 DIAGNOSIS — E559 Vitamin D deficiency, unspecified: Secondary | ICD-10-CM

## 2021-06-27 DIAGNOSIS — G894 Chronic pain syndrome: Secondary | ICD-10-CM

## 2021-06-27 DIAGNOSIS — M816 Localized osteoporosis [Lequesne]: Secondary | ICD-10-CM

## 2021-06-27 DIAGNOSIS — F1120 Opioid dependence, uncomplicated: Secondary | ICD-10-CM

## 2021-06-27 DIAGNOSIS — G4733 Obstructive sleep apnea (adult) (pediatric): Secondary | ICD-10-CM | POA: Diagnosis not present

## 2021-06-27 DIAGNOSIS — E782 Mixed hyperlipidemia: Secondary | ICD-10-CM

## 2021-06-27 HISTORY — DX: Mixed hyperlipidemia: E78.2

## 2021-06-27 HISTORY — DX: Vitamin D deficiency, unspecified: E55.9

## 2021-06-27 HISTORY — DX: Localized osteoporosis (Lequesne): M81.6

## 2021-06-27 HISTORY — DX: Other hyperlipidemia: E78.49

## 2021-06-27 NOTE — Progress Notes (Signed)
Subjective:  Patient ID: Christina Cantrell, female    DOB: 1960/09/06  Age: 60 y.o. MRN: [id number]  Chief Complaint  Patient presents with   Hyperlipidemia    HPI Hyperlipidemia: on crestor 20 mg once daily.  Patient is not eating healthy.  She does not exercise.  Osteoporosis: needs a new DEXA. Prolia was recommended previously but did not pursue due to COVID epidemic.  Central Sleep Apnea: Per patient it was severe.  She is given some sort of a new VPAP machine she says that it did not work.  The pressure is too strong.  She saw Dr. Alcide Clever and would like to be referred back to Dr. Alcide Clever.   Multiple sclerosis: Patient had a rough 3 years.  She has a lot of pain.  She has neuropathy associated with it.  She sees a pain clinic.  Her pain doctor is Simeon Craft, NP  Current Outpatient Medications on File Prior to Visit  Medication Sig Dispense Refill   albuterol (VENTOLIN HFA) 108 (90 Base) MCG/ACT inhaler Inhale 2 puffs into the lungs every 6 (six) hours as needed for wheezing or shortness of breath. 1 each 1   citalopram (CELEXA) 40 MG tablet      clobetasol (TEMOVATE) 0.05 % external solution Apply topically.     cyclobenzaprine (FLEXERIL) 10 MG tablet      dicyclomine (BENTYL) 10 MG capsule Take by mouth.     doxycycline (PERIOSTAT) 20 MG tablet Take 20 mg by mouth 2 (two) times daily.     fentaNYL (DURAGESIC) 75 MCG/HR      modafinil (PROVIGIL) 100 MG tablet Take by mouth.     norethindrone-ethinyl estradiol (FEMHRT 1/5) 1-5 MG-MCG TABS tablet      oxyCODONE-acetaminophen (PERCOCET) 10-325 MG tablet      No current facility-administered medications on file prior to visit.   Past Medical History:  Diagnosis Date   Multiple sclerosis ([place]) 83   Age 41 yo   Past Surgical History:  Procedure Laterality Date   BREAST BIOPSY Bilateral 08/24/2020   BREAST EXCISIONAL BIOPSY Right 1975    History reviewed. No pertinent family history. Social History   Socioeconomic History    Marital status: Married    Spouse name: Not on file   Number of children: Not on file   Years of education: Not on file   Highest education level: Not on file  Occupational History   Not on file  Tobacco Use   Smoking status: Former    Years: 20.00    Types: Cigarettes    Quit date: 2018    Years since quitting: 4.8   Smokeless tobacco: Never  Substance and Sexual Activity   Alcohol use: Never   Drug use: Never   Sexual activity: Not on file  Other Topics Concern   Not on file  Social History Narrative   Not on file   Social Determinants of Health   Financial Resource Strain: Not on file  Food Insecurity: Not on file  Transportation Needs: Not on file  Physical Activity: Not on file  Stress: Not on file  Social Connections: Not on file    Review of Systems  Constitutional:  Negative for chills, fatigue and fever.  HENT:  Negative for congestion, rhinorrhea and sore throat.   Respiratory:  Negative for cough and shortness of breath.   Cardiovascular:  Negative for chest pain.  Gastrointestinal:  Negative for abdominal pain, constipation, diarrhea, nausea and vomiting.  Genitourinary:  Negative for  dysuria and urgency.  Musculoskeletal:  Positive for arthralgias, back pain and myalgias.  Skin:  Positive for rash.  Neurological:  Negative for dizziness, weakness, light-headedness and headaches.  Psychiatric/Behavioral:  Negative for dysphoric mood. The patient is not nervous/anxious.     Objective:  BP 110/60   Pulse 88   Temp 97.8 F (36.6 C)   Resp 16   Ht 5\' 4"  (1.626 m)   Wt 136 lb (61.7 kg)   BMI 23.34 kg/m   BP/Weight 06/27/2021 12/28/2020 2/87/8676  Systolic BP 720 947 096  Diastolic BP 60 64 62  Wt. (Lbs) 136 136 137  BMI 23.34 22.63 22.8    Physical Exam Vitals reviewed.  Constitutional:      Appearance: Normal appearance. She is normal weight.  Neck:     Vascular: No carotid bruit.  Cardiovascular:     Rate and Rhythm: Normal rate and  regular rhythm.     Heart sounds: Normal heart sounds.  Pulmonary:     Effort: Pulmonary effort is normal. No respiratory distress.     Breath sounds: Normal breath sounds.  Abdominal:     General: Abdomen is flat. Bowel sounds are normal.     Palpations: Abdomen is soft.     Tenderness: There is no abdominal tenderness.  Neurological:     Mental Status: She is alert and oriented to person, place, and time.  Psychiatric:        Mood and Affect: Mood normal.        Behavior: Behavior normal.    Diabetic Foot Exam - Simple   No data filed      Lab Results  Component Value Date   WBC 6.9 06/27/2021   HGB 14.1 06/27/2021   HCT 41.6 06/27/2021   PLT 334 06/27/2021   GLUCOSE 78 06/27/2021   CHOL 170 06/27/2021   TRIG 121 06/27/2021   HDL 64 06/27/2021   LDLCALC 85 06/27/2021   ALT 7 06/27/2021   AST 14 06/27/2021   NA 142 06/27/2021   K 4.3 06/27/2021   CL 104 06/27/2021   CREATININE 0.81 06/27/2021   BUN 8 06/27/2021   CO2 23 06/27/2021   TSH 1.400 09/16/2020      Assessment & Plan:   Problem List Items Addressed This Visit       Nervous and Auditory   Multiple sclerosis ([place])    The current medical regimen is effective;  continue present plan and medications.        Musculoskeletal and Integument   Localized osteoporosis without current pathological fracture   Relevant Orders   DG MOBILE BONE DENSITY     Other   Mixed hyperlipidemia - Primary    Well controlled.  No changes to medicines.  Continue to work on eating a healthy diet and exercise.  Labs drawn today.       Relevant Orders   CBC with Differential/Platelet (Completed)   Comprehensive metabolic panel (Completed)   Lipid panel (Completed)   VITAMIN D 25 Hydroxy (Vit-D Deficiency, Fractures) (Completed)   Vitamin D deficiency   Central sleep apnea syndrome   Other Visit Diagnoses     Chronic pain syndrome       Uncomplicated opioid dependence ([place])         .  No orders of the  defined types were placed in this encounter.   Orders Placed This Encounter  Procedures   DG MOBILE BONE DENSITY   CBC with Differential/Platelet   Comprehensive metabolic  panel   Lipid panel   VITAMIN D 25 Hydroxy (Vit-D Deficiency, Fractures)   Cardiovascular Risk Assessment   Ambulatory referral to Pulmonology     Follow-up: Return in about 4 weeks (around 07/25/2021) for awv.  An After Visit Summary was printed and given to the patient.  Rochel Brome, MD [facility] [phone]

## 2021-06-28 DIAGNOSIS — M542 Cervicalgia: Secondary | ICD-10-CM | POA: Diagnosis not present

## 2021-06-28 DIAGNOSIS — I10 Essential (primary) hypertension: Secondary | ICD-10-CM | POA: Diagnosis not present

## 2021-06-28 LAB — CBC WITH DIFFERENTIAL/PLATELET
Basophils Absolute: 0 10*3/uL (ref 0.0–0.2)
Basos: 1 %
EOS (ABSOLUTE): 0.1 10*3/uL (ref 0.0–0.4)
Eos: 2 %
Hematocrit: 41.6 % (ref 34.0–46.6)
Hemoglobin: 14.1 g/dL (ref 11.1–15.9)
Immature Grans (Abs): 0 10*3/uL (ref 0.0–0.1)
Immature Granulocytes: 0 %
Lymphocytes Absolute: 2.4 10*3/uL (ref 0.7–3.1)
Lymphs: 34 %
MCH: 31.1 pg (ref 26.6–33.0)
MCHC: 33.9 g/dL (ref 31.5–35.7)
MCV: 92 fL (ref 79–97)
Monocytes Absolute: 0.4 10*3/uL (ref 0.1–0.9)
Monocytes: 5 %
Neutrophils Absolute: 4 10*3/uL (ref 1.4–7.0)
Neutrophils: 58 %
Platelets: 334 10*3/uL (ref 150–450)
RBC: 4.54 x10E6/uL (ref 3.77–5.28)
RDW: 12 % (ref 11.7–15.4)
WBC: 6.9 10*3/uL (ref 3.4–10.8)

## 2021-06-28 LAB — COMPREHENSIVE METABOLIC PANEL
ALT: 7 IU/L (ref 0–32)
AST: 14 IU/L (ref 0–40)
Albumin/Globulin Ratio: 2.3 — ABNORMAL HIGH (ref 1.2–2.2)
Albumin: 4.5 g/dL (ref 3.8–4.9)
Alkaline Phosphatase: 50 IU/L (ref 44–121)
BUN/Creatinine Ratio: 10 (ref 9–23)
BUN: 8 mg/dL (ref 6–24)
Bilirubin Total: 0.4 mg/dL (ref 0.0–1.2)
CO2: 23 mmol/L (ref 20–29)
Calcium: 9.3 mg/dL (ref 8.7–10.2)
Chloride: 104 mmol/L (ref 96–106)
Creatinine, Ser: 0.81 mg/dL (ref 0.57–1.00)
Globulin, Total: 2 g/dL (ref 1.5–4.5)
Glucose: 78 mg/dL (ref 70–99)
Potassium: 4.3 mmol/L (ref 3.5–5.2)
Sodium: 142 mmol/L (ref 134–144)
Total Protein: 6.5 g/dL (ref 6.0–8.5)
eGFR: 84 mL/min/{1.73_m2} (ref >59)

## 2021-06-28 LAB — LIPID PANEL
Chol/HDL Ratio: 2.7 ratio (ref 0.0–4.4)
Cholesterol, Total: 170 mg/dL (ref 100–199)
HDL: 64 mg/dL (ref >39)
LDL Chol Calc (NIH): 85 mg/dL (ref 0–99)
Triglycerides: 121 mg/dL (ref 0–149)
VLDL Cholesterol Cal: 21 mg/dL (ref 5–40)

## 2021-06-28 LAB — VITAMIN D 25 HYDROXY (VIT D DEFICIENCY, FRACTURES): Vit D, 25-Hydroxy: 31.5 ng/mL (ref 30.0–100.0)

## 2021-06-28 NOTE — Progress Notes (Signed)
Blood count normal.  Liver function normal.  Kidney function normal.  Vitamin D low normal.  Recommend vitamin D3 50000 weekly.  Cholesterol: great! Follow up in 3 months fasting.

## 2021-06-29 ENCOUNTER — Other Ambulatory Visit: Payer: Self-pay | Admitting: Family Medicine

## 2021-06-29 DIAGNOSIS — E782 Mixed hyperlipidemia: Secondary | ICD-10-CM

## 2021-06-30 MED ORDER — ROSUVASTATIN CALCIUM 20 MG PO TABS: 20.0000 mg | ORAL_TABLET | Freq: Every day | ORAL | 0 refills | Status: DC

## 2021-06-30 NOTE — Telephone Encounter (Signed)
Refill sent to pharmacy.   

## 2021-07-03 NOTE — Assessment & Plan Note (Signed)
>>  ASSESSMENT AND PLAN FOR FAMILIAL HYPERLIPIDEMIA WRITTEN ON 07/04/2021  4:19 AM BY Jazzie Trampe, MD  Well controlled.  Continue crestor 20 mg once daily.  Continue to work on eating a healthy diet

## 2021-07-03 NOTE — Assessment & Plan Note (Addendum)
Well controlled.  Continue crestor 20 mg once daily.  Continue to work on eating a healthy diet

## 2021-07-03 NOTE — Assessment & Plan Note (Addendum)
Management per specialist. Neurology.

## 2021-07-04 MED ORDER — VITAMIN D (ERGOCALCIFEROL) 1.25 MG (50000 UNIT) PO CAPS: 50000.0000 [IU] | ORAL_CAPSULE | ORAL | 3 refills | Status: DC

## 2021-07-04 NOTE — Assessment & Plan Note (Signed)
Refer to dr. Alcide Clever.

## 2021-07-04 NOTE — Assessment & Plan Note (Signed)
Low. Recommend vitamin D 50K weekly.

## 2021-07-04 NOTE — Assessment & Plan Note (Signed)
Order dexa. Recommend calcium with D.

## 2021-07-21 ENCOUNTER — Ambulatory Visit
Admission: RE | Admit: 2021-07-21 | Discharge: 2021-07-21 | Disposition: A | Discharge status: Home / Self Care | Payer: Medicare HMO | Source: Ambulatory Visit | Attending: Family Medicine | Admitting: Family Medicine

## 2021-07-21 ENCOUNTER — Other Ambulatory Visit: Payer: Self-pay

## 2021-07-21 DIAGNOSIS — M5412 Radiculopathy, cervical region: Secondary | ICD-10-CM

## 2021-07-21 DIAGNOSIS — M85852 Other specified disorders of bone density and structure, left thigh: Secondary | ICD-10-CM | POA: Diagnosis not present

## 2021-07-21 DIAGNOSIS — Z780 Asymptomatic menopausal state: Secondary | ICD-10-CM | POA: Diagnosis not present

## 2021-07-21 HISTORY — DX: Radiculopathy, cervical region: M54.12

## 2021-08-18 DIAGNOSIS — H04123 Dry eye syndrome of bilateral lacrimal glands: Secondary | ICD-10-CM | POA: Diagnosis not present

## 2021-09-07 DIAGNOSIS — H52213 Irregular astigmatism, bilateral: Secondary | ICD-10-CM | POA: Diagnosis not present

## 2021-09-12 DIAGNOSIS — M542 Cervicalgia: Secondary | ICD-10-CM

## 2021-09-12 DIAGNOSIS — M5412 Radiculopathy, cervical region: Secondary | ICD-10-CM | POA: Diagnosis not present

## 2021-09-12 DIAGNOSIS — G35 Multiple sclerosis: Secondary | ICD-10-CM | POA: Diagnosis not present

## 2021-09-12 DIAGNOSIS — M792 Neuralgia and neuritis, unspecified: Secondary | ICD-10-CM | POA: Diagnosis not present

## 2021-09-12 DIAGNOSIS — F1120 Opioid dependence, uncomplicated: Secondary | ICD-10-CM | POA: Insufficient documentation

## 2021-09-12 HISTORY — DX: Cervicalgia: M54.2

## 2021-09-12 HISTORY — DX: Opioid dependence, uncomplicated: F11.20

## 2021-09-25 ENCOUNTER — Other Ambulatory Visit: Payer: Self-pay | Admitting: Family Medicine

## 2021-09-25 DIAGNOSIS — E782 Mixed hyperlipidemia: Secondary | ICD-10-CM

## 2021-10-03 NOTE — Progress Notes (Signed)
Subjective:  Patient ID: Christina Cantrell, female    DOB: 1961-03-31  Age: 61 y.o. MRN: [id number]  Chief Complaint  Patient presents with   Hyperlipidemia   Multiple sclerosis    HPI Hyperlipidemia: She takes rosuvastatin 20 mg daily. Not eating healthy. Maybe a little better. Unable to exercise.  Depression: Citalopram 40 mg daily. Depression is well controlled.   Patient has a burning pain in LUQ which radiates from stomach around to left side of abdomen. Started 2 days ago. No rashes. Brought on by food. Improved. Patient got choked on something in the middle of the night and it caused a coughing fit. This was prior to abdominal pain.   Central sleep apnea: VPAP is very old. Patient was supposed to get another sleep study, but patient had been exposed to covid 19. Patient will call back to get rescheduled.    Current Outpatient Medications on File Prior to Visit  Medication Sig Dispense Refill   citalopram (CELEXA) 40 MG tablet      clobetasol (TEMOVATE) 0.05 % external solution Apply topically.     cyclobenzaprine (FLEXERIL) 10 MG tablet      dicyclomine (BENTYL) 10 MG capsule Take by mouth.     doxycycline (PERIOSTAT) 20 MG tablet Take 20 mg by mouth 2 (two) times daily.     fentaNYL (DURAGESIC) 75 MCG/HR      metroNIDAZOLE (METROCREAM) 0.75 % cream Apply topically 2 (two) times daily.     modafinil (PROVIGIL) 100 MG tablet Take by mouth.     norethindrone-ethinyl estradiol (FEMHRT 1/5) 1-5 MG-MCG TABS tablet      oxyCODONE-acetaminophen (PERCOCET) 10-325 MG tablet      rosuvastatin (CRESTOR) 20 MG tablet TAKE 1 TABLET BY MOUTH EVERY DAY 90 tablet 0   Vitamin D, Ergocalciferol, (DRISDOL) 1.25 MG (50000 UNIT) CAPS capsule Take 1 capsule (50,000 Units total) by mouth every 7 (seven) days. 15 capsule 3   No current facility-administered medications on file prior to visit.   Past Medical History:  Diagnosis Date   Multiple sclerosis ([place]) 83   Age 42 yo   Past Surgical  History:  Procedure Laterality Date   BREAST BIOPSY Bilateral 08/24/2020   BREAST EXCISIONAL BIOPSY Right 1975    History reviewed. No pertinent family history. Social History   Socioeconomic History   Marital status: Married    Spouse name: Not on file   Number of children: Not on file   Years of education: Not on file   Highest education level: Not on file  Occupational History   Not on file  Tobacco Use   Smoking status: Former    Years: 20.00    Types: Cigarettes    Quit date: 2018    Years since quitting: 5.0   Smokeless tobacco: Never  Substance and Sexual Activity   Alcohol use: Never   Drug use: Never   Sexual activity: Not on file  Other Topics Concern   Not on file  Social History Narrative   Not on file   Social Determinants of Health   Financial Resource Strain: Not on file  Food Insecurity: Not on file  Transportation Needs: Not on file  Physical Activity: Not on file  Stress: Not on file  Social Connections: Not on file    Review of Systems  Constitutional:  Positive for fatigue. Negative for appetite change and fever.  HENT:  Negative for congestion, ear pain, sinus pressure and sore throat.   Eyes:  Positive for  visual disturbance. Negative for pain.  Respiratory:  Negative for cough, chest tightness, shortness of breath and wheezing.   Cardiovascular:  Negative for chest pain and palpitations.  Gastrointestinal:  Positive for abdominal pain (Left upper quadrant pain). Negative for constipation, diarrhea, nausea and vomiting.  Genitourinary:  Negative for dysuria and hematuria.  Musculoskeletal:  Positive for back pain (Chronic MS), myalgias (Chronic MS) and neck pain (Chronic MS). Negative for arthralgias and joint swelling.  Skin:  Positive for rash (On belly).  Neurological:  Positive for weakness and headaches. Negative for dizziness.  Psychiatric/Behavioral:  Negative for dysphoric mood. The patient is not nervous/anxious.     Objective:   BP 128/78    Pulse 92    Temp 99 F (37.2 C) (Temporal)    Ht 5\' 4"  (1.626 m)    Wt 138 lb (62.6 kg)    SpO2 98%    BMI 23.69 kg/m   BP/Weight 10/04/2021 06/27/2021 6/76/1948  Systolic BP 932 671 245  Diastolic BP 78 60 64  Wt. (Lbs) 138 136 136  BMI 23.69 23.34 22.63    Physical Exam Vitals reviewed.  Constitutional:      Appearance: Normal appearance. She is normal weight.  Neck:     Vascular: No carotid bruit.  Cardiovascular:     Rate and Rhythm: Normal rate and regular rhythm.     Heart sounds: Normal heart sounds.  Pulmonary:     Effort: Pulmonary effort is normal. No respiratory distress.     Breath sounds: Normal breath sounds.  Abdominal:     General: Abdomen is flat. Bowel sounds are normal.     Palpations: Abdomen is soft.     Tenderness: There is abdominal tenderness (LUQ/Epigastric region. tender under left lower ribs. mild flank tenderness.).  Neurological:     Mental Status: She is alert and oriented to person, place, and time.  Psychiatric:        Mood and Affect: Mood normal.        Behavior: Behavior normal.    Diabetic Foot Exam - Simple   No data filed      Lab Results  Component Value Date   WBC 6.9 06/27/2021   HGB 14.1 06/27/2021   HCT 41.6 06/27/2021   PLT 334 06/27/2021   GLUCOSE 78 06/27/2021   CHOL 170 06/27/2021   TRIG 121 06/27/2021   HDL 64 06/27/2021   LDLCALC 85 06/27/2021   ALT 7 06/27/2021   AST 14 06/27/2021   NA 142 06/27/2021   K 4.3 06/27/2021   CL 104 06/27/2021   CREATININE 0.81 06/27/2021   BUN 8 06/27/2021   CO2 23 06/27/2021   TSH 1.400 09/16/2020      Assessment & Plan:   Problem List Items Addressed This Visit       Respiratory   Centrilobular emphysema ([place])    Uses albuterol infrequently but sent refill.       Relevant Medications   albuterol (VENTOLIN HFA) 108 (90 Base) MCG/ACT inhaler     Nervous and Auditory   Multiple sclerosis ([facility]) - Primary    Stable.         Musculoskeletal and  Integument   Localized osteoporosis without current pathological fracture    Recent dexa showed osteopenia.  Recommend calcium with D 1200-1500 mg daily.         Other   Fatigue    Check labs.       Mixed hyperlipidemia    Await labs/testing for  assessment and recommendations. Continue to work on eating a healthy diet and exercise.  Labs drawn today.        Relevant Orders   Comprehensive metabolic panel   Lipid panel   CBC with Differential/Platelet   Vitamin D deficiency    Check level      Relevant Orders   VITAMIN D 25 Hydroxy (Vit-D Deficiency, Fractures)   Central sleep apnea syndrome    Patient to call and reschedule sleep study at DR Chodri's office.       Flank pain    Check UA      Relevant Orders   POCT URINALYSIS DIP (CLINITEK) (Completed)   Epigastric pain    Pepcid ac 20 mg one twice daily (samples given) Check h. Pylori.       Relevant Orders   H. pylori breath test   Uncomplicated opioid dependence ([place])    Seen at pain clinic.      .  Meds ordered this encounter  Medications   albuterol (VENTOLIN HFA) 108 (90 Base) MCG/ACT inhaler    Sig: Inhale 2 puffs into the lungs every 6 (six) hours as needed for wheezing or shortness of breath.    Dispense:  3 each    Refill:  1    Orders Placed This Encounter  Procedures   Comprehensive metabolic panel   Lipid panel   CBC with Differential/Platelet   VITAMIN D 25 Hydroxy (Vit-D Deficiency, Fractures)   H. pylori breath test   POCT URINALYSIS DIP (CLINITEK)     Follow-up: Return in about 6 months (around 04/03/2022) for chronic fasting.  An After Visit Summary was printed and given to the patient.  Total time spent on today's visit was greater than 30 minutes, including both face-to-face time and nonface-to-face time personally spent on review of chart (labs and imaging), discussing labs and goals, discussing further work-up, treatment options, referrals to specialist if needed, reviewing  outside records of pertinent, answering patient's questions, and coordinating care.   I,Lauren M Auman,acting as a scribe for Rochel Brome, MD.,have documented all relevant documentation on the behalf of Rochel Brome, MD,as directed by  Rochel Brome, MD while in the presence of Rochel Brome, MD.    Rochel Brome, MD [place] [phone]

## 2021-10-04 ENCOUNTER — Other Ambulatory Visit: Payer: Self-pay

## 2021-10-04 ENCOUNTER — Encounter: Payer: Self-pay | Admitting: Family Medicine

## 2021-10-04 ENCOUNTER — Ambulatory Visit (INDEPENDENT_AMBULATORY_CARE_PROVIDER_SITE_OTHER): Payer: Medicare HMO | Admitting: Family Medicine

## 2021-10-04 VITALS — BP 128/78 | HR 92 | Temp 99.0°F | Ht 64.0 in | Wt 138.0 lb

## 2021-10-04 DIAGNOSIS — M816 Localized osteoporosis [Lequesne]: Secondary | ICD-10-CM

## 2021-10-04 DIAGNOSIS — R1013 Epigastric pain: Secondary | ICD-10-CM

## 2021-10-04 DIAGNOSIS — E559 Vitamin D deficiency, unspecified: Secondary | ICD-10-CM | POA: Diagnosis not present

## 2021-10-04 DIAGNOSIS — G4731 Primary central sleep apnea: Secondary | ICD-10-CM | POA: Diagnosis not present

## 2021-10-04 DIAGNOSIS — R5383 Other fatigue: Secondary | ICD-10-CM

## 2021-10-04 DIAGNOSIS — G35 Multiple sclerosis: Secondary | ICD-10-CM

## 2021-10-04 DIAGNOSIS — R1010 Upper abdominal pain, unspecified: Secondary | ICD-10-CM

## 2021-10-04 DIAGNOSIS — E782 Mixed hyperlipidemia: Secondary | ICD-10-CM | POA: Diagnosis not present

## 2021-10-04 DIAGNOSIS — J432 Centrilobular emphysema: Secondary | ICD-10-CM

## 2021-10-04 DIAGNOSIS — M8588 Other specified disorders of bone density and structure, other site: Secondary | ICD-10-CM | POA: Diagnosis not present

## 2021-10-04 DIAGNOSIS — M5412 Radiculopathy, cervical region: Secondary | ICD-10-CM

## 2021-10-04 DIAGNOSIS — R109 Unspecified abdominal pain: Secondary | ICD-10-CM

## 2021-10-04 DIAGNOSIS — F1120 Opioid dependence, uncomplicated: Secondary | ICD-10-CM

## 2021-10-04 HISTORY — DX: Epigastric pain: R10.13

## 2021-10-04 HISTORY — DX: Centrilobular emphysema: J43.2

## 2021-10-04 HISTORY — DX: Unspecified abdominal pain: R10.9

## 2021-10-04 HISTORY — DX: Upper abdominal pain, unspecified: R10.10

## 2021-10-04 LAB — POCT URINALYSIS DIP (CLINITEK)
Blood, UA: NEGATIVE
Glucose, UA: NEGATIVE mg/dL
Leukocytes, UA: NEGATIVE
Nitrite, UA: NEGATIVE
Spec Grav, UA: >=1.03 — AB (ref 1.010–1.025)
Urobilinogen, UA: 1 E.U./dL
pH, UA: 6 (ref 5.0–8.0)

## 2021-10-04 MED ORDER — ALBUTEROL SULFATE HFA 108 (90 BASE) MCG/ACT IN AERS: 2.0000 | INHALATION_SPRAY | Freq: Four times a day (QID) | RESPIRATORY_TRACT | 1 refills | Status: DC | PRN

## 2021-10-04 NOTE — Assessment & Plan Note (Addendum)
Pepcid ac 20 mg one twice daily (samples given) Check h. Pylori.

## 2021-10-04 NOTE — Assessment & Plan Note (Signed)
Check level 

## 2021-10-04 NOTE — Assessment & Plan Note (Signed)
Seen at pain clinic.

## 2021-10-04 NOTE — Assessment & Plan Note (Signed)
Recent dexa showed osteopenia.  Recommend calcium with D 1200-1500 mg daily.

## 2021-10-04 NOTE — Assessment & Plan Note (Signed)
Uses albuterol infrequently but sent refill.

## 2021-10-04 NOTE — Assessment & Plan Note (Signed)
>>  ASSESSMENT AND PLAN FOR FAMILIAL HYPERLIPIDEMIA WRITTEN ON 10/04/2021  1:27 PM BY Meera Vasco, MD  Await labs/testing for assessment and recommendations. Continue to work on eating a healthy diet and exercise.  Labs drawn today.

## 2021-10-04 NOTE — Assessment & Plan Note (Signed)
Patient to call and reschedule sleep study at DR Chodri's office.

## 2021-10-04 NOTE — Assessment & Plan Note (Signed)
Stable

## 2021-10-04 NOTE — Assessment & Plan Note (Signed)
Await labs/testing for assessment and recommendations. Continue to work on eating a healthy diet and exercise.  Labs drawn today.   

## 2021-10-04 NOTE — Assessment & Plan Note (Signed)
Check labs 

## 2021-10-04 NOTE — Assessment & Plan Note (Signed)
Check UA 

## 2021-10-05 LAB — LIPID PANEL
Chol/HDL Ratio: 2.8 ratio (ref 0.0–4.4)
Cholesterol, Total: 188 mg/dL (ref 100–199)
HDL: 66 mg/dL (ref >39)
LDL Chol Calc (NIH): 101 mg/dL — ABNORMAL HIGH (ref 0–99)
Triglycerides: 118 mg/dL (ref 0–149)
VLDL Cholesterol Cal: 21 mg/dL (ref 5–40)

## 2021-10-05 LAB — COMPREHENSIVE METABOLIC PANEL
ALT: 22 IU/L (ref 0–32)
AST: 20 IU/L (ref 0–40)
Albumin/Globulin Ratio: 2 (ref 1.2–2.2)
Albumin: 4.4 g/dL (ref 3.8–4.9)
Alkaline Phosphatase: 62 IU/L (ref 44–121)
BUN/Creatinine Ratio: 7 — ABNORMAL LOW (ref 9–23)
BUN: 6 mg/dL (ref 6–24)
Bilirubin Total: 0.5 mg/dL (ref 0.0–1.2)
CO2: 24 mmol/L (ref 20–29)
Calcium: 9.7 mg/dL (ref 8.7–10.2)
Chloride: 103 mmol/L (ref 96–106)
Creatinine, Ser: 0.9 mg/dL (ref 0.57–1.00)
Globulin, Total: 2.2 g/dL (ref 1.5–4.5)
Glucose: 75 mg/dL (ref 70–99)
Potassium: 4 mmol/L (ref 3.5–5.2)
Sodium: 142 mmol/L (ref 134–144)
Total Protein: 6.6 g/dL (ref 6.0–8.5)
eGFR: 74 mL/min/{1.73_m2} (ref >59)

## 2021-10-05 LAB — CBC WITH DIFFERENTIAL/PLATELET
Basophils Absolute: 0 10*3/uL (ref 0.0–0.2)
Basos: 1 %
EOS (ABSOLUTE): 0.2 10*3/uL (ref 0.0–0.4)
Eos: 2 %
Hematocrit: 43.6 % (ref 34.0–46.6)
Hemoglobin: 14.5 g/dL (ref 11.1–15.9)
Immature Grans (Abs): 0 10*3/uL (ref 0.0–0.1)
Immature Granulocytes: 0 %
Lymphocytes Absolute: 2.1 10*3/uL (ref 0.7–3.1)
Lymphs: 27 %
MCH: 31 pg (ref 26.6–33.0)
MCHC: 33.3 g/dL (ref 31.5–35.7)
MCV: 93 fL (ref 79–97)
Monocytes Absolute: 0.5 10*3/uL (ref 0.1–0.9)
Monocytes: 7 %
Neutrophils Absolute: 5 10*3/uL (ref 1.4–7.0)
Neutrophils: 63 %
Platelets: 328 10*3/uL (ref 150–450)
RBC: 4.67 x10E6/uL (ref 3.77–5.28)
RDW: 11.7 % (ref 11.7–15.4)
WBC: 7.9 10*3/uL (ref 3.4–10.8)

## 2021-10-05 LAB — VITAMIN D 25 HYDROXY (VIT D DEFICIENCY, FRACTURES): Vit D, 25-Hydroxy: 37.9 ng/mL (ref 30.0–100.0)

## 2021-10-05 LAB — CARDIOVASCULAR RISK ASSESSMENT

## 2021-10-05 LAB — H. PYLORI BREATH TEST: H pylori Breath Test: NEGATIVE

## 2021-11-03 DIAGNOSIS — Z124 Encounter for screening for malignant neoplasm of cervix: Secondary | ICD-10-CM | POA: Diagnosis not present

## 2021-11-03 DIAGNOSIS — Z01419 Encounter for gynecological examination (general) (routine) without abnormal findings: Secondary | ICD-10-CM | POA: Diagnosis not present

## 2021-11-03 LAB — HM PAP SMEAR: HM Pap smear: NEGATIVE

## 2021-11-03 LAB — RESULTS CONSOLE HPV: CHL HPV: NEGATIVE

## 2021-11-17 DIAGNOSIS — L719 Rosacea, unspecified: Secondary | ICD-10-CM | POA: Diagnosis not present

## 2021-11-18 IMAGING — MR MR BREAST BILAT WO/W CM
1 series · 19 of 48 positions shown · IV contrast (gadavist)
Comparison: 08/23/2020 MR and prior studies.

CLINICAL DATA: 60-year-old female for follow-up of benign bilateral
MR guided biopsies 6 months ago (fibroadenoma on the RIGHT and
fibrocystic changes on the LEFT). Strong family history of breast
cancer. History of remote RIGHT breast surgery in 6337.

LABS:  None performed today
EXAM:
BILATERAL BREAST MRI WITH AND WITHOUT CONTRAST
TECHNIQUE: Multiplanar, multisequence MR images of both breasts were obtained
prior to and following the intravenous administration of 6 ml of
Gadavist

[Series 4: t2_tirm_tra ipat (a-p) · axial · 3.0mm · 0.70mm/px · z∈[-78,+75]mm · 19 of 55 slices shown]
[im 1/55]
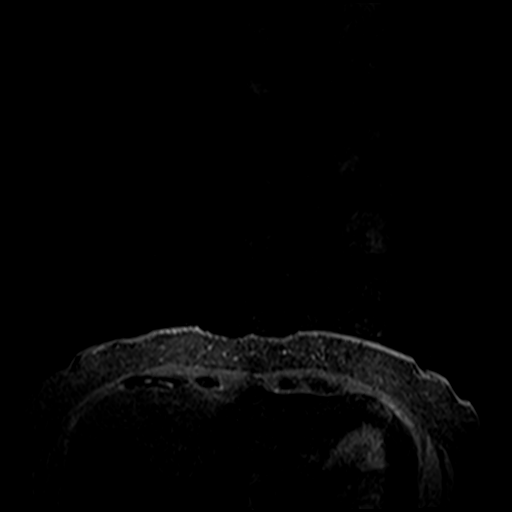
[im 2/55]
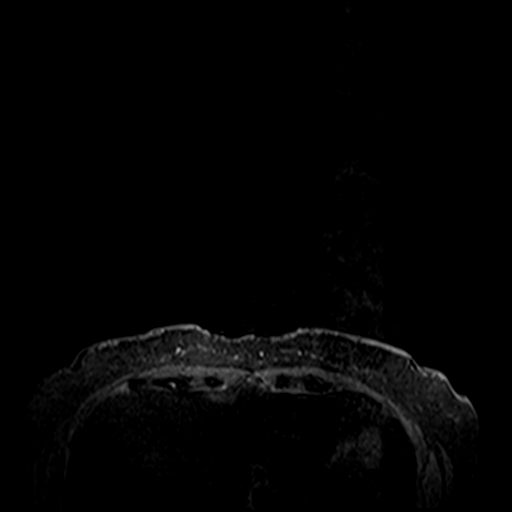
[im 3/55]
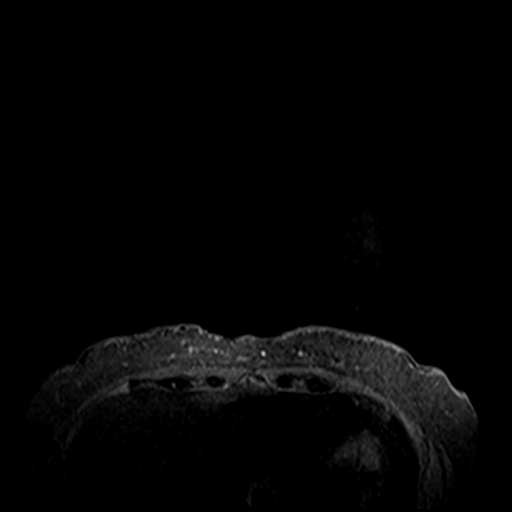
[im 4/55]
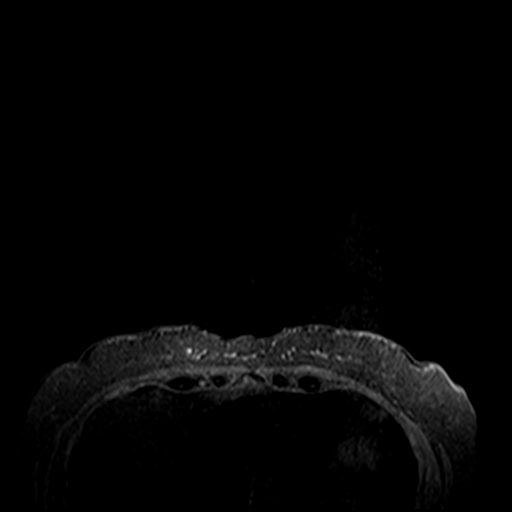
[im 5/55]
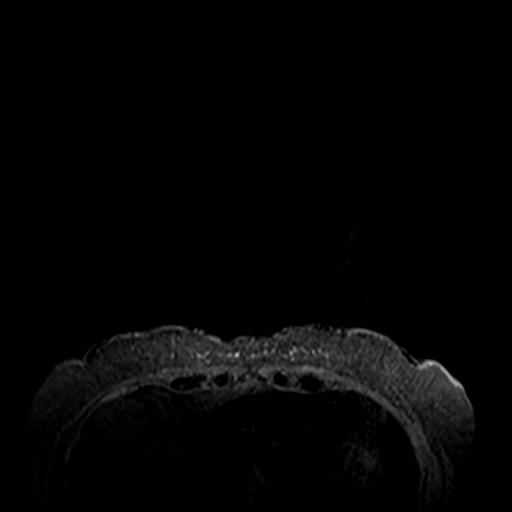
[im 6/55]
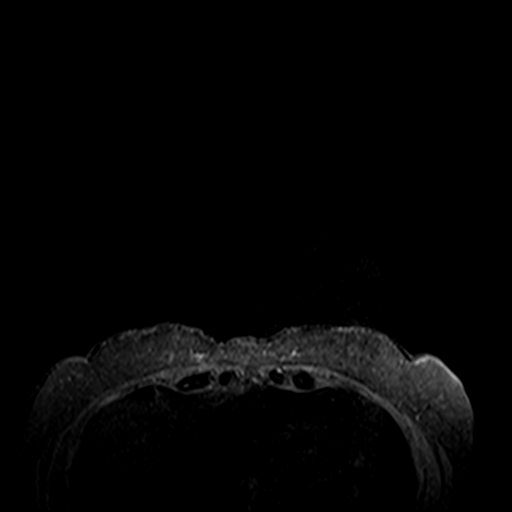
[im 7/55]
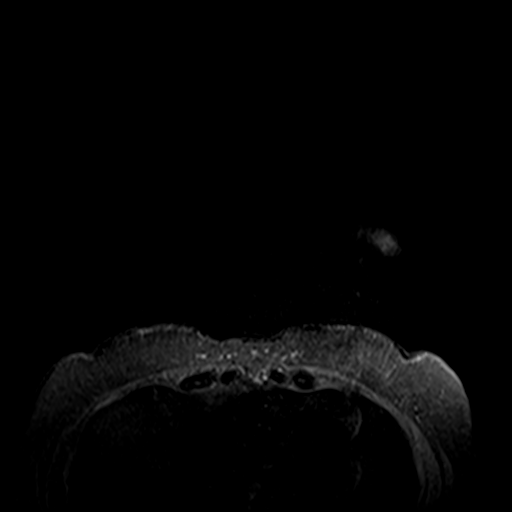
[im 9/55]
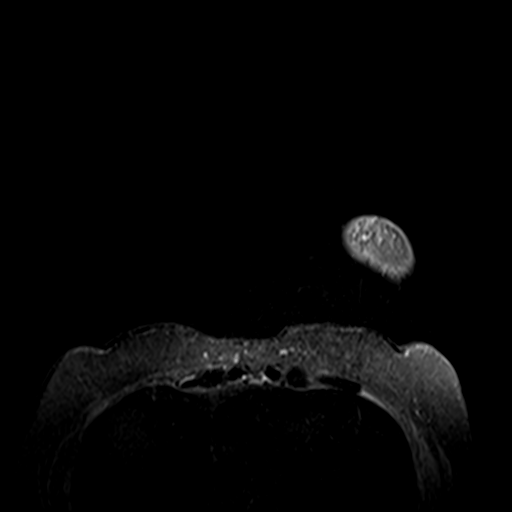
[im 10/55]
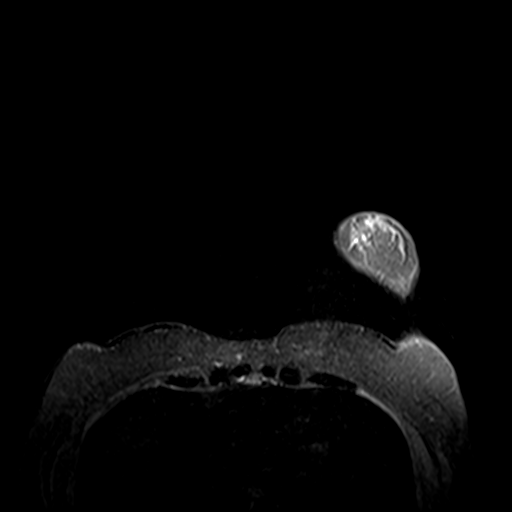
[im 11/55]
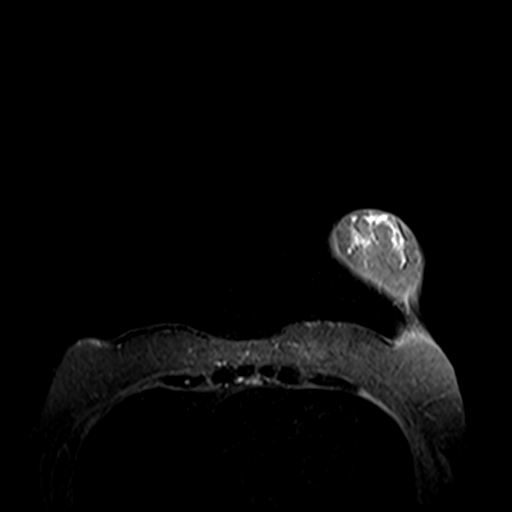
[im 12/55]
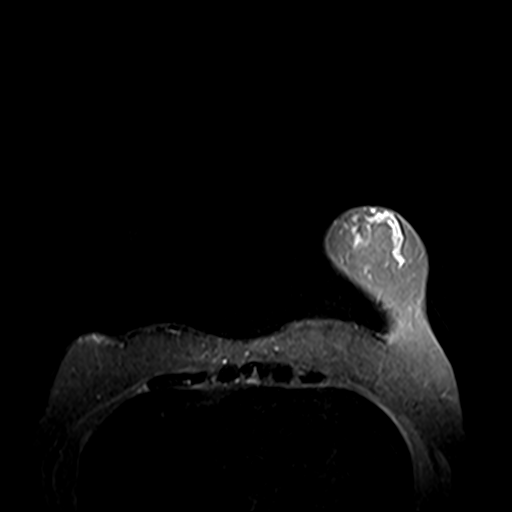
[im 18/55]
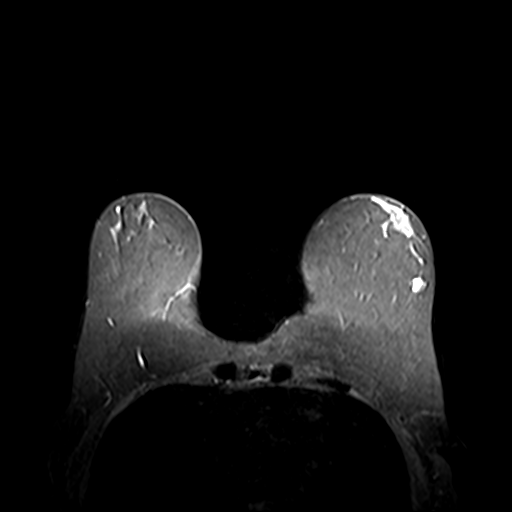
[im 25/55]
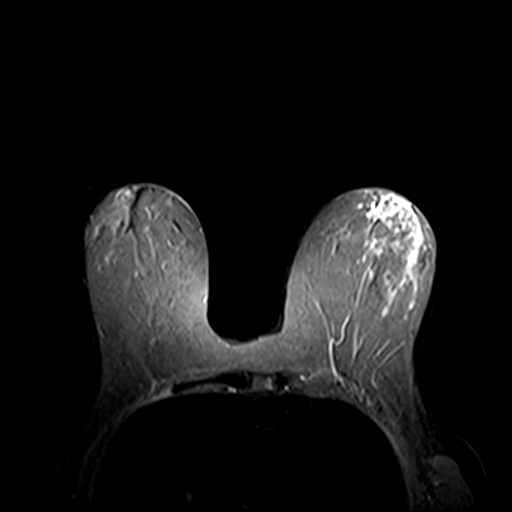
[im 28/55]
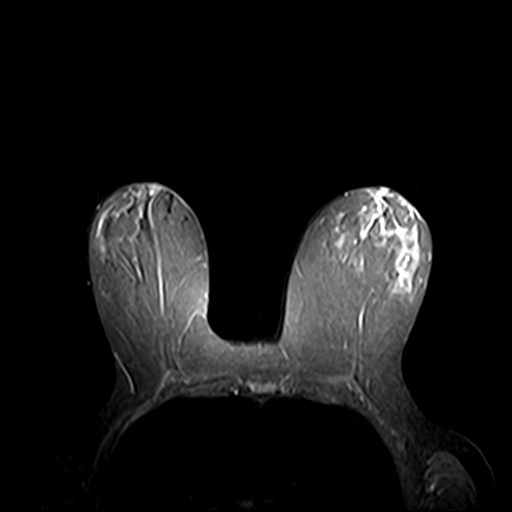
[im 32/55]
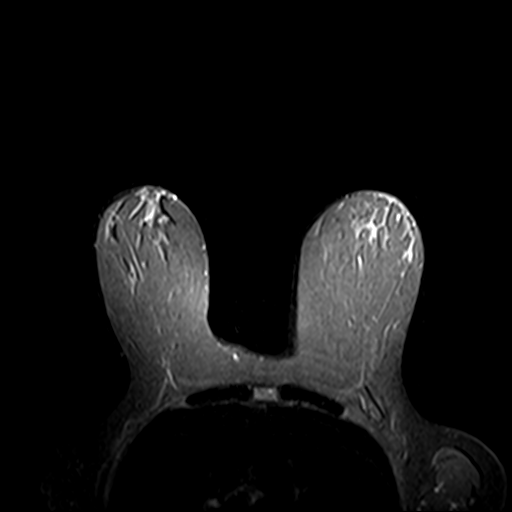
[im 38/55]
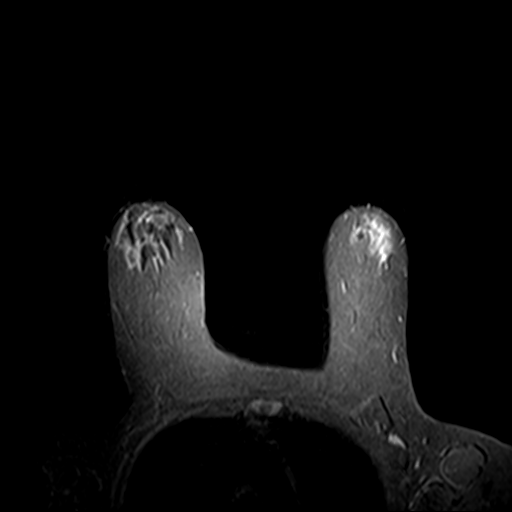
[im 45/55]
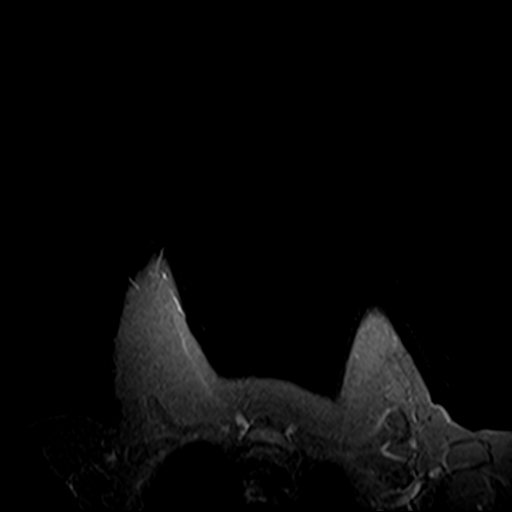
[im 46/55]
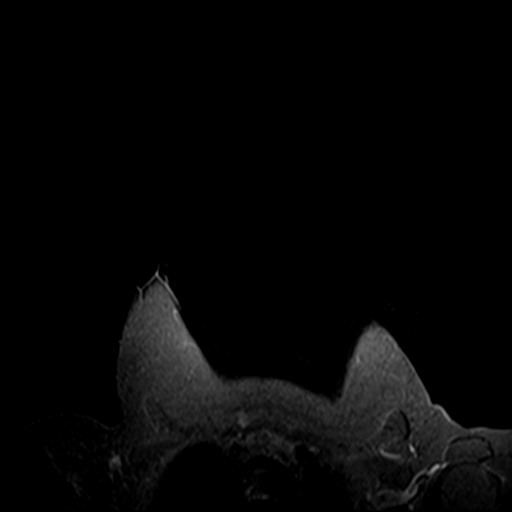
[im 52/55]
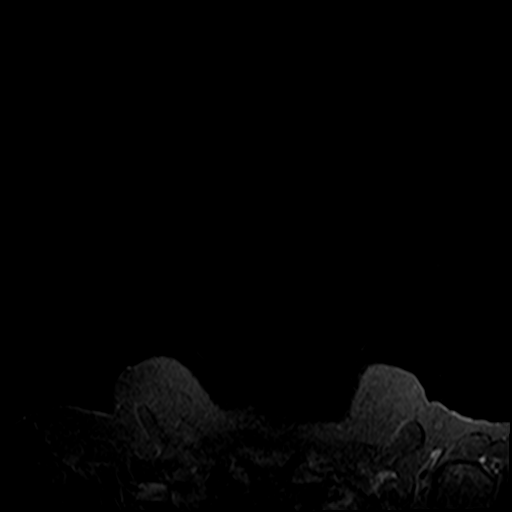

[19 of 48 positions shown; findings below may reference images not displayed]

Three-dimensional MR images were rendered by post-processing of the
original MR data on an independent workstation. The
three-dimensional MR images were interpreted, and findings are
reported in the following complete MRI report for this study. Three
dimensional images were evaluated at the independent interpreting
workstation using the DynaCAD thin client.
FINDINGS: Breast composition: b. Scattered fibroglandular tissue.

Background parenchymal enhancement: Mild

Right breast: No mass or abnormal enhancement. Biopsy clip artifact
within the anterior UPPER RIGHT breast noted.

Left breast: No suspicious mass or enhancement. Biopsy clip artifact
within the OUTER LEFT breast identified. A stable 0.7 cm posterior
central LEFT breast mass has been unchanged mammographically for
several years and considered benign.

Lymph nodes: No abnormal appearing lymph nodes.

Ancillary findings:  None.
IMPRESSION: 1. No suspicious MR findings within either breast. Biopsy clip
artifacts as described.

RECOMMENDATION:
Bilateral screening mammogram in 1 year to resume annual mammogram
schedule.

Consider clinical follow-up as indicated. Any further workup should
be based on clinical grounds.

BI-RADS CATEGORY  2: Benign.

## 2021-11-28 ENCOUNTER — Ambulatory Visit (INDEPENDENT_AMBULATORY_CARE_PROVIDER_SITE_OTHER): Payer: Medicare HMO | Admitting: Legal Medicine

## 2021-11-28 ENCOUNTER — Encounter: Payer: Self-pay | Admitting: Legal Medicine

## 2021-11-28 ENCOUNTER — Telehealth: Payer: Self-pay

## 2021-11-28 VITALS — BP 142/80 | HR 103 | Temp 98.9°F | Resp 15 | Ht 64.0 in | Wt 137.0 lb

## 2021-11-28 DIAGNOSIS — M79605 Pain in left leg: Secondary | ICD-10-CM | POA: Diagnosis not present

## 2021-11-28 DIAGNOSIS — I82412 Acute embolism and thrombosis of left femoral vein: Secondary | ICD-10-CM | POA: Diagnosis not present

## 2021-11-28 DIAGNOSIS — M79662 Pain in left lower leg: Secondary | ICD-10-CM | POA: Diagnosis not present

## 2021-11-28 DIAGNOSIS — R600 Localized edema: Secondary | ICD-10-CM | POA: Diagnosis not present

## 2021-11-28 NOTE — Telephone Encounter (Signed)
Christina Cantrell called this afternoon concerned for a blood clot, upper leg is "really" big. Per Dr. Tobie Poet send to ED for evaluation. Patient notified. Patient refused to go to the emergency department. symptoms started about two weeks ago and has slowly increased in size. Appointment has been scheduled with Dr. Henrene Pastor for this afternoon at 2:20 PM. Dr. Tobie Poet has been notified ?

## 2021-11-28 NOTE — Progress Notes (Signed)
? ?Acute Office Visit ? ?Subjective:  ? ? Patient ID: Christina Cantrell, female    DOB: Nov 08, 1960, 61 y.o.   MRN: [id number] ? ?Chief Complaint  ?Patient presents with  ? Thight swelling  ? ? ?HPI: ?Patient is in today for her left thight swelling and soreness since 2 weeks ago and getting worse the last past 4 days. She is going to the Gym, she wants to be evaluate for blood clot. She had bruise on leg from going into car to [place]. ? ?Past Medical History:  ?Diagnosis Date  ? Multiple sclerosis ([place]) 1991  ? Age 16 yo  ? ? ?Past Surgical History:  ?Procedure Laterality Date  ? BREAST BIOPSY Bilateral 08/24/2020  ? BREAST EXCISIONAL BIOPSY Right 1975  ? ? ?History reviewed. No pertinent family history. ? ?Social History  ? ?Socioeconomic History  ? Marital status: Married  ?  Spouse name: Not on file  ? Number of children: Not on file  ? Years of education: Not on file  ? Highest education level: Not on file  ?Occupational History  ? Not on file  ?Tobacco Use  ? Smoking status: Former  ?  Years: 20.00  ?  Types: Cigarettes  ?  Quit date: 2018  ?  Years since quitting: 5.2  ? Smokeless tobacco: Never  ?Substance and Sexual Activity  ? Alcohol use: Never  ? Drug use: Never  ? Sexual activity: Not on file  ?Other Topics Concern  ? Not on file  ?Social History Narrative  ? Not on file  ? ?Social Determinants of Health  ? ?Financial Resource Strain: Not on file  ?Food Insecurity: Not on file  ?Transportation Needs: Not on file  ?Physical Activity: Not on file  ?Stress: Not on file  ?Social Connections: Not on file  ?Intimate Partner Violence: Not on file  ? ? ?Outpatient Medications Prior to Visit  ?Medication Sig Dispense Refill  ? citalopram (CELEXA) 40 MG tablet     ? clobetasol (TEMOVATE) 0.05 % external solution Apply topically.    ? cyclobenzaprine (FLEXERIL) 10 MG tablet     ? dicyclomine (BENTYL) 10 MG capsule Take by mouth.    ? doxycycline (VIBRAMYCIN) 50 MG capsule Take 50 mg by mouth 2 (two) times daily.     ? fentaNYL (DURAGESIC) 75 MCG/HR     ? ketoconazole (NIZORAL) 2 % shampoo ketoconazole 2 % shampoo ? APPLY TO AFFECTED AREA 3 TIMES A WEEK AS DIRECTED    ? Loteprednol Etabonate 0.5 % GEL loteprednol etabonate 0.5 % eye gel drops ? INSTILL 1 DROP 4 TIMES EVERY DAY TO BOTH EYES X 1 WEEK THEN TWICE DAILY UNTIL RETURN APPT    ? metroNIDAZOLE (METROCREAM) 0.75 % cream Apply topically 2 (two) times daily.    ? modafinil (PROVIGIL) 100 MG tablet Take by mouth.    ? norethindrone-ethinyl estradiol (FEMHRT 1/5) 1-5 MG-MCG TABS tablet     ? oxyCODONE-acetaminophen (PERCOCET) 10-325 MG tablet     ? rosuvastatin (CRESTOR) 20 MG tablet TAKE 1 TABLET BY MOUTH EVERY DAY 90 tablet 0  ? Vitamin D, Ergocalciferol, (DRISDOL) 1.25 MG (50000 UNIT) CAPS capsule Take 1 capsule (50,000 Units total) by mouth every 7 (seven) days. 15 capsule 3  ? albuterol (VENTOLIN HFA) 108 (90 Base) MCG/ACT inhaler Inhale 2 puffs into the lungs every 6 (six) hours as needed for wheezing or shortness of breath. 3 each 1  ? doxycycline (PERIOSTAT) 20 MG tablet Take 20 mg by mouth 2 (  two) times daily.    ? ?No facility-administered medications prior to visit.  ? ? ?Allergies  ?Allergen Reactions  ? Sertraline Hcl Hives  ? ? ?Review of Systems  ?Constitutional:  Negative for chills, fatigue and fever.  ?HENT:  Negative for congestion, ear pain and sore throat.   ?Respiratory:  Positive for shortness of breath. Negative for cough.   ?Cardiovascular:  Negative for chest pain and palpitations.  ?Gastrointestinal:  Negative for abdominal pain, constipation, diarrhea, nausea and vomiting.  ?Endocrine: Negative for polydipsia, polyphagia and polyuria.  ?Genitourinary:  Negative for difficulty urinating and dysuria.  ?Musculoskeletal:  Positive for myalgias (Left thight). Negative for arthralgias and back pain.  ?Skin:  Negative for rash.  ?Neurological:  Negative for headaches.  ?Psychiatric/Behavioral:  Negative for dysphoric mood. The patient is not  nervous/anxious.   ? ?   ?Objective:  ?  ?Physical Exam ?Vitals reviewed.  ?Constitutional:   ?   General: She is not in acute distress. ?   Appearance: Normal appearance.  ?HENT:  ?   Head: Normocephalic and atraumatic.  ?   Right Ear: Tympanic membrane normal.  ?   Left Ear: Tympanic membrane normal.  ?Eyes:  ?   Conjunctiva/sclera: Conjunctivae normal.  ?   Pupils: Pupils are equal, round, and reactive to light.  ?Cardiovascular:  ?   Rate and Rhythm: Normal rate and regular rhythm.  ?   Pulses: Normal pulses.  ?   Heart sounds: Normal heart sounds. No murmur heard. ?  No gallop.  ?Pulmonary:  ?   Effort: Pulmonary effort is normal. No respiratory distress.  ?   Breath sounds: Normal breath sounds. No wheezing.  ?Abdominal:  ?   General: Abdomen is flat. Bowel sounds are normal.  ?Musculoskeletal:     ?   General: Normal range of motion.  ?   Comments: Pain over superficial femoral vein.  Leg hurts laterally left   ?Skin: ?   General: Skin is warm.  ?   Capillary Refill: Capillary refill takes less than 2 seconds.  ?Neurological:  ?   General: No focal deficit present.  ?   Mental Status: She is alert and oriented to person, place, and time. Mental status is at baseline.  ? ? ?BP (!) 142/80   Pulse (!) 103   Temp 98.9 ?F (37.2 ?C)   Resp 15   Ht _0  (1.626 m)   Wt 137 lb (62.1 kg)   SpO2 98%   BMI 23.52 kg/m?  ?Wt Readings from Last 3 Encounters:  ?11/28/21 137 lb (62.1 kg)  ?10/04/21 138 lb (62.6 kg)  ?06/27/21 136 lb (61.7 kg)  ? ? ?Health Maintenance Due  ?Topic Date Due  ? HIV Screening  Never done  ? Hepatitis C Screening  Never done  ? TETANUS/TDAP  Never done  ? PAP SMEAR-Modifier  Never done  ? COLONOSCOPY (Pts 45-52yr Insurance coverage will need to be confirmed)  Never done  ? Zoster Vaccines- Shingrix (1 of 2) Never done  ? ? ?There are no preventive care reminders to display for this patient. ? ? ?Lab Results  ?Component Value Date  ? TSH 1.400 09/16/2020  ? ?Lab Results  ?Component Value  Date  ? WBC 7.9 10/04/2021  ? HGB 14.5 10/04/2021  ? HCT 43.6 10/04/2021  ? MCV 93 10/04/2021  ? PLT 328 10/04/2021  ? ?Lab Results  ?Component Value Date  ? NA 142 10/04/2021  ? K 4.0 10/04/2021  ? CO2  24 10/04/2021  ? GLUCOSE 75 10/04/2021  ? BUN 6 10/04/2021  ? CREATININE 0.90 10/04/2021  ? BILITOT 0.5 10/04/2021  ? ALKPHOS 62 10/04/2021  ? AST 20 10/04/2021  ? ALT 22 10/04/2021  ? PROT 6.6 10/04/2021  ? ALBUMIN 4.4 10/04/2021  ? CALCIUM 9.7 10/04/2021  ? EGFR 74 10/04/2021  ? ?Lab Results  ?Component Value Date  ? CHOL 188 10/04/2021  ? ?Lab Results  ?Component Value Date  ? HDL 66 10/04/2021  ? ?Lab Results  ?Component Value Date  ? LDLCALC 101 (H) 10/04/2021  ? ?Lab Results  ?Component Value Date  ? TRIG 118 10/04/2021  ? ?Lab Results  ?Component Value Date  ? CHOLHDL 2.8 10/04/2021  ? ?No results found for: HGBA1C ? ?   ?Assessment & Plan:  ? ?Diagnoses and all orders for this visit: ?Acute deep vein thrombosis (DVT) of femoral vein of left lower extremity ([place]) ?-     VAS [place] LOWER EXTREMITY VENOUS (DVT); Future ?Patient is having pain over the superficial common femoral vein.  Has some bruising on the lateral aspect of the leg also.  We need to perform an venous Doppler stat.  If negative for her to use warm moist compresses which will probably relieve the rest of this.  If DVT is positive she will need to be started on anticoagulation. ?  ? ? ?  ? ?Follow-up: Return in about 1 week (around 12/05/2021) for possibe dvt. ? ?An After Visit Summary was printed and given to the patient. ? ?Reinaldo Meeker, MD ?[facility] ?[phone]

## 2021-11-29 ENCOUNTER — Other Ambulatory Visit: Payer: Self-pay

## 2021-11-29 DIAGNOSIS — I82412 Acute embolism and thrombosis of left femoral vein: Secondary | ICD-10-CM

## 2021-11-30 DIAGNOSIS — G35 Multiple sclerosis: Secondary | ICD-10-CM | POA: Diagnosis not present

## 2021-11-30 DIAGNOSIS — F1120 Opioid dependence, uncomplicated: Secondary | ICD-10-CM | POA: Diagnosis not present

## 2021-11-30 DIAGNOSIS — M792 Neuralgia and neuritis, unspecified: Secondary | ICD-10-CM | POA: Diagnosis not present

## 2021-12-22 ENCOUNTER — Other Ambulatory Visit: Payer: Self-pay | Admitting: Family Medicine

## 2021-12-22 DIAGNOSIS — E782 Mixed hyperlipidemia: Secondary | ICD-10-CM

## 2021-12-22 NOTE — Telephone Encounter (Signed)
Refill sent to pharmacy.   

## 2021-12-23 ENCOUNTER — Telehealth: Payer: Self-pay

## 2021-12-23 NOTE — Telephone Encounter (Signed)
Patient notified.  She is going to schedule a follow-up appointment next week if symptoms have not improved.   ?

## 2021-12-23 NOTE — Telephone Encounter (Signed)
Patient was seen 11/28/21 with complaints of pain and swelling to her left lower extremity - she had a doppler done that same day which was negative.  Today she called and said that the pain and swelling is gradually getting worse than before.  She states that there is bruise like discoloration that appears to be where a vein runs that she first noted last month.  She denies redness/warmth.  Please advise. ?

## 2021-12-29 ENCOUNTER — Ambulatory Visit (INDEPENDENT_AMBULATORY_CARE_PROVIDER_SITE_OTHER): Payer: Medicare HMO | Admitting: Family Medicine

## 2021-12-29 ENCOUNTER — Encounter: Payer: Self-pay | Admitting: Family Medicine

## 2021-12-29 VITALS — BP 128/68 | HR 70 | Temp 97.4°F | Ht 64.0 in | Wt 135.0 lb

## 2021-12-29 DIAGNOSIS — M816 Localized osteoporosis [Lequesne]: Secondary | ICD-10-CM

## 2021-12-29 DIAGNOSIS — Z122 Encounter for screening for malignant neoplasm of respiratory organs: Secondary | ICD-10-CM

## 2021-12-29 DIAGNOSIS — R600 Localized edema: Secondary | ICD-10-CM | POA: Diagnosis not present

## 2021-12-29 DIAGNOSIS — R1032 Left lower quadrant pain: Secondary | ICD-10-CM | POA: Insufficient documentation

## 2021-12-29 HISTORY — DX: Encounter for screening for malignant neoplasm of respiratory organs: Z12.2

## 2021-12-29 HISTORY — DX: Left lower quadrant pain: R10.32

## 2021-12-29 HISTORY — DX: Localized edema: R60.0

## 2021-12-29 NOTE — Progress Notes (Signed)
? ?Acute Office Visit ? ?Subjective:  ? ? Patient ID: Christina Cantrell, female    DOB: 20-Oct-1960, 61 y.o.   MRN: [id number] ? ?Chief Complaint  ?Patient presents with  ? Left leg edema  ? ? ?HPI: ?Patient is in today for left leg edema of upper leg. Tender. No inguinal lymphadenopathy. Patient has been working out at the gym despite her leg pain. Initially she thought she pulled something, but it has been 2 months. [place] no DVT. Had mild PAD on right side. Left side was normal.  ? ?Past Medical History:  ?Diagnosis Date  ? Multiple sclerosis ([place]) 1991  ? Age 39 yo  ? ? ?Past Surgical History:  ?Procedure Laterality Date  ? BREAST BIOPSY Bilateral 08/24/2020  ? BREAST EXCISIONAL BIOPSY Right 1975  ? ? ?History reviewed. No pertinent family history. ? ?Social History  ? ?Socioeconomic History  ? Marital status: Married  ?  Spouse name: Not on file  ? Number of children: Not on file  ? Years of education: Not on file  ? Highest education level: Not on file  ?Occupational History  ? Not on file  ?Tobacco Use  ? Smoking status: Former  ?  Years: 20.00  ?  Types: Cigarettes  ?  Quit date: 2018  ?  Years since quitting: 5.3  ? Smokeless tobacco: Never  ?Substance and Sexual Activity  ? Alcohol use: Never  ? Drug use: Never  ? Sexual activity: Not on file  ?Other Topics Concern  ? Not on file  ?Social History Narrative  ? Not on file  ? ?Social Determinants of Health  ? ?Financial Resource Strain: Not on file  ?Food Insecurity: Not on file  ?Transportation Needs: Not on file  ?Physical Activity: Not on file  ?Stress: Not on file  ?Social Connections: Not on file  ?Intimate Partner Violence: Not on file  ? ? ?Outpatient Medications Prior to Visit  ?Medication Sig Dispense Refill  ? citalopram (CELEXA) 40 MG tablet     ? clobetasol (TEMOVATE) 0.05 % external solution Apply topically.    ? cyclobenzaprine (FLEXERIL) 10 MG tablet     ? dicyclomine (BENTYL) 10 MG capsule Take by mouth.    ? doxycycline (PERIOSTAT) 20 MG tablet  Take 20 mg by mouth 2 (two) times daily.    ? fentaNYL (DURAGESIC) 75 MCG/HR     ? ketoconazole (NIZORAL) 2 % shampoo ketoconazole 2 % shampoo ? APPLY TO AFFECTED AREA 3 TIMES A WEEK AS DIRECTED    ? Loteprednol Etabonate 0.5 % GEL loteprednol etabonate 0.5 % eye gel drops ? INSTILL 1 DROP 4 TIMES EVERY DAY TO BOTH EYES X 1 WEEK THEN TWICE DAILY UNTIL RETURN APPT    ? metroNIDAZOLE (METROCREAM) 0.75 % cream Apply topically 2 (two) times daily.    ? modafinil (PROVIGIL) 100 MG tablet Take by mouth.    ? norethindrone-ethinyl estradiol (FEMHRT 1/5) 1-5 MG-MCG TABS tablet     ? oxyCODONE-acetaminophen (PERCOCET) 10-325 MG tablet     ? rosuvastatin (CRESTOR) 20 MG tablet TAKE 1 TABLET BY MOUTH EVERY DAY 90 tablet 0  ? Vitamin D, Ergocalciferol, (DRISDOL) 1.25 MG (50000 UNIT) CAPS capsule Take 1 capsule (50,000 Units total) by mouth every 7 (seven) days. 15 capsule 3  ? doxycycline (VIBRAMYCIN) 50 MG capsule Take 50 mg by mouth 2 (two) times daily.    ? ?No facility-administered medications prior to visit.  ? ? ?Allergies  ?Allergen Reactions  ? Sertraline Hcl Hives  ? ? ?  Review of Systems  ?Constitutional:  Negative for appetite change, fatigue and fever.  ?HENT:  Negative for congestion, ear pain, sinus pressure and sore throat.   ?Respiratory:  Negative for cough, chest tightness, shortness of breath and wheezing.   ?Cardiovascular:  Positive for leg swelling (Left leg swelling). Negative for chest pain and palpitations.  ?Gastrointestinal:  Negative for abdominal pain, constipation, diarrhea, nausea and vomiting.  ?Genitourinary:  Negative for dysuria and hematuria.  ?Musculoskeletal:  Negative for arthralgias, back pain, joint swelling and myalgias.  ?Skin:  Negative for rash.  ?Neurological:  Negative for dizziness, weakness and headaches.  ?Psychiatric/Behavioral:  Negative for dysphoric mood. The patient is not nervous/anxious.   ? ?   ?Objective:  ?  ?Physical Exam ?Vitals reviewed.  ?Constitutional:   ?    Appearance: Normal appearance. She is normal weight.  ?Cardiovascular:  ?   Rate and Rhythm: Normal rate and regular rhythm.  ?   Pulses: Normal pulses.  ?   Heart sounds: Normal heart sounds.  ?Pulmonary:  ?   Effort: Pulmonary effort is normal.  ?   Breath sounds: Normal breath sounds.  ?Abdominal:  ?   General: Abdomen is flat. Bowel sounds are normal.  ?   Palpations: Abdomen is soft.  ?   Tenderness: There is abdominal tenderness (LLQ abdominal tenderness).  ?Musculoskeletal:  ?   Left lower leg: Edema (Left leg edematous from inguinal region to foot.) present.  ?Lymphadenopathy:  ?   Lower Body: No right inguinal adenopathy. No left inguinal adenopathy.  ?Neurological:  ?   Mental Status: She is alert and oriented to person, place, and time.  ?Psychiatric:     ?   Mood and Affect: Mood normal.     ?   Behavior: Behavior normal.  ? ? ?BP 128/68 (BP Location: Right Arm, Patient Position: Sitting)   Pulse 70   Temp (!) 97.4 ?F (36.3 ?C) (Temporal)   Ht 5' 4" (1.626 m)   Wt 135 lb (61.2 kg)   SpO2 98%   BMI 23.17 kg/m?  ?Wt Readings from Last 3 Encounters:  ?12/29/21 135 lb (61.2 kg)  ?11/28/21 137 lb (62.1 kg)  ?10/04/21 138 lb (62.6 kg)  ? ? ?Health Maintenance Due  ?Topic Date Due  ? HIV Screening  Never done  ? Hepatitis C Screening  Never done  ? TETANUS/TDAP  Never done  ? PAP SMEAR-Modifier  Never done  ? COLONOSCOPY (Pts 45-91yr Insurance coverage will need to be confirmed)  Never done  ? Zoster Vaccines- Shingrix (1 of 2) Never done  ? ? ?There are no preventive care reminders to display for this patient. ? ? ?Lab Results  ?Component Value Date  ? TSH 1.400 09/16/2020  ? ?Lab Results  ?Component Value Date  ? WBC 7.9 10/04/2021  ? HGB 14.5 10/04/2021  ? HCT 43.6 10/04/2021  ? MCV 93 10/04/2021  ? PLT 328 10/04/2021  ? ?Lab Results  ?Component Value Date  ? NA 142 10/04/2021  ? K 4.0 10/04/2021  ? CO2 24 10/04/2021  ? GLUCOSE 75 10/04/2021  ? BUN 6 10/04/2021  ? CREATININE 0.90 10/04/2021  ? BILITOT  0.5 10/04/2021  ? ALKPHOS 62 10/04/2021  ? AST 20 10/04/2021  ? ALT 22 10/04/2021  ? PROT 6.6 10/04/2021  ? ALBUMIN 4.4 10/04/2021  ? CALCIUM 9.7 10/04/2021  ? EGFR 74 10/04/2021  ? ?Lab Results  ?Component Value Date  ? CHOL 188 10/04/2021  ? ?Lab Results  ?Component  Value Date  ? HDL 66 10/04/2021  ? ?Lab Results  ?Component Value Date  ? LDLCALC 101 (H) 10/04/2021  ? ?Lab Results  ?Component Value Date  ? TRIG 118 10/04/2021  ? ?Lab Results  ?Component Value Date  ? CHOLHDL 2.8 10/04/2021  ? ?No results found for: HGBA1C ? ? ? ?   ?Assessment & Plan:  ? ?Problem List Items Addressed This Visit   ? ?  ? Musculoskeletal and Integument  ? Localized osteoporosis without current pathological fracture  ?  Continue prolia.  ? ?  ?  ?  ? Other  ? Leg edema, left - Primary  ?  Order ct of abdomen and pelvis to rule out intraabdominal mass. ? ?  ?  ? Relevant Orders  ? CT ABDOMEN PELVIS W CONTRAST  ? Left lower quadrant abdominal pain  ?  Rule out abdominal mass. ? ?  ?  ? Relevant Orders  ? CT ABDOMEN PELVIS W CONTRAST  ? Screening for lung cancer  ?  Ct chest lung cancer screen ? ?  ?  ? Relevant Orders  ? CT CHEST LUNG CANCER SCREENING LOW DOSE WO CONTRAST  ? ?Follow up: as needed.  ? ?I,Lauren M Auman,acting as a scribe for Kirsten Cox, MD.,have documented all relevant documentation on the behalf of Kirsten Cox, MD,as directed by  Kirsten Cox, MD while in the presence of Kirsten Cox, MD.  ? ?Kirsten Cox, MD ?

## 2022-01-02 ENCOUNTER — Telehealth: Payer: Self-pay | Admitting: Family Medicine

## 2022-01-02 NOTE — Telephone Encounter (Signed)
? ?  Christina Cantrell has been scheduled for the following appointment: ? ?WHAT: CT LUNG SCREEN AND CT ABDOMEN AND PELVIS ?WHERE: [facility] ?DATE: 01/05/22 ?TIME: 9:15 AM CHECK IN ? ?Patient has been made aware. ? ?

## 2022-01-02 NOTE — Assessment & Plan Note (Signed)
Rule out abdominal mass. ?

## 2022-01-02 NOTE — Assessment & Plan Note (Signed)
Continue prolia.  

## 2022-01-02 NOTE — Assessment & Plan Note (Signed)
Order ct of abdomen and pelvis to rule out intraabdominal mass. ?

## 2022-01-02 NOTE — Assessment & Plan Note (Signed)
Ct chest lung cancer screen ?

## 2022-01-24 ENCOUNTER — Other Ambulatory Visit: Payer: Self-pay | Admitting: Family Medicine

## 2022-01-24 DIAGNOSIS — E782 Mixed hyperlipidemia: Secondary | ICD-10-CM

## 2022-02-01 ENCOUNTER — Ambulatory Visit: Payer: Medicare HMO | Admitting: Family Medicine

## 2022-02-03 DIAGNOSIS — N200 Calculus of kidney: Secondary | ICD-10-CM | POA: Diagnosis not present

## 2022-02-03 DIAGNOSIS — K869 Disease of pancreas, unspecified: Secondary | ICD-10-CM | POA: Diagnosis not present

## 2022-02-03 DIAGNOSIS — K551 Chronic vascular disorders of intestine: Secondary | ICD-10-CM | POA: Diagnosis not present

## 2022-02-03 DIAGNOSIS — Z122 Encounter for screening for malignant neoplasm of respiratory organs: Secondary | ICD-10-CM | POA: Diagnosis not present

## 2022-02-03 DIAGNOSIS — R600 Localized edema: Secondary | ICD-10-CM | POA: Diagnosis not present

## 2022-02-03 DIAGNOSIS — J439 Emphysema, unspecified: Secondary | ICD-10-CM | POA: Diagnosis not present

## 2022-02-03 DIAGNOSIS — K7689 Other specified diseases of liver: Secondary | ICD-10-CM | POA: Diagnosis not present

## 2022-02-03 DIAGNOSIS — I672 Cerebral atherosclerosis: Secondary | ICD-10-CM | POA: Diagnosis not present

## 2022-02-03 DIAGNOSIS — Z87891 Personal history of nicotine dependence: Secondary | ICD-10-CM | POA: Diagnosis not present

## 2022-02-03 DIAGNOSIS — I700 Atherosclerosis of aorta: Secondary | ICD-10-CM | POA: Diagnosis not present

## 2022-02-11 NOTE — Progress Notes (Signed)
Subjective:  Patient ID: Christina Cantrell, female    DOB: 1961/07/11  Age: 61 y.o. MRN: [id number]  Chief Complaint  Patient presents with   Hyperlipidemia   COPD    HPI Patient is a 61 year old white female with multiple sclerosis, hyperlipidemia, depression who presents for chronic follow-up.  She had a CT scan of her lungs and abdomen and pelvis recently.  The scan showed aortic atherosclerosis, atherosclerosis of the SMA, carotid atherosclerosis. Recommending interventional radiology referral was recommended. Lung CT scan also showed mild emphysema.  No lung cancer.  CT of the abdomen also showed a large burden of stool consistent with constipation. Patient reports after eating she will frequently get epigastric abdominal pain and burning even after just drinking fluids.  In addition, she is complaining of difficulty swallowing both solids and liquids although solids are worse.  Swallowing issues have been going on for a year and a half.  But are worsening recently. February 2017 patient had colonoscopy with Dr. Lyda Jester.  She has not had an EGD.  Patient is also complaining of hair loss and a change in her nails.  Nails are thinner and discolored.   Patient has a urinary hesitancy x 2 months. No dysuria.   Patient has seen cardiology in the past.  She saw Dr. Bettina Gavia.  It was several years ago.  She had a heart monitor and echocardiogram and was told she had a leaky valve.  Patient did recently go to the zoo and walked without developing chest pain or leg pain.  She did have some left hip pain but history is inconsistent with claudication.  Hyperlipidemia: Patient is taking Rosuvastatin 20 mg daily.  Depression: She is taking Celexa 40 mg daily.  Current Outpatient Medications on File Prior to Visit  Medication Sig Dispense Refill   citalopram (CELEXA) 40 MG tablet      clobetasol (TEMOVATE) 0.05 % external solution Apply topically.     cyclobenzaprine (FLEXERIL) 10 MG tablet       dicyclomine (BENTYL) 10 MG capsule Take by mouth.     doxycycline (PERIOSTAT) 20 MG tablet Take 20 mg by mouth 2 (two) times daily.     fentaNYL (DURAGESIC) 75 MCG/HR      ketoconazole (NIZORAL) 2 % shampoo ketoconazole 2 % shampoo  APPLY TO AFFECTED AREA 3 TIMES A WEEK AS DIRECTED     Loteprednol Etabonate 0.5 % GEL loteprednol etabonate 0.5 % eye gel drops  INSTILL 1 DROP 4 TIMES EVERY DAY TO BOTH EYES X 1 WEEK THEN TWICE DAILY UNTIL RETURN APPT     metroNIDAZOLE (METROCREAM) 0.75 % cream Apply topically 2 (two) times daily.     modafinil (PROVIGIL) 100 MG tablet Take by mouth.     norethindrone-ethinyl estradiol (FEMHRT 1/5) 1-5 MG-MCG TABS tablet      oxyCODONE-acetaminophen (PERCOCET) 10-325 MG tablet      No current facility-administered medications on file prior to visit.   Past Medical History:  Diagnosis Date   Multiple sclerosis ([place]) 59   Age 61 yo   Past Surgical History:  Procedure Laterality Date   BREAST BIOPSY Bilateral 08/24/2020   BREAST EXCISIONAL BIOPSY Right 1975    History reviewed. No pertinent family history. Social History   Socioeconomic History   Marital status: Married    Spouse name: Not on file   Number of children: Not on file   Years of education: Not on file   Highest education level: Not on file  Occupational History  Not on file  Tobacco Use   Smoking status: Former    Years: 20.00    Types: Cigarettes    Quit date: 2018    Years since quitting: 5.4   Smokeless tobacco: Never  Substance and Sexual Activity   Alcohol use: Never   Drug use: Never   Sexual activity: Not on file  Other Topics Concern   Not on file  Social History Narrative   Not on file   Social Determinants of Health   Financial Resource Strain: Not on file  Food Insecurity: Not on file  Transportation Needs: Not on file  Physical Activity: Not on file  Stress: Not on file  Social Connections: Not on file    Review of Systems  Constitutional:   Positive for chills. Negative for fatigue and fever.  HENT:  Negative for congestion, ear pain and sore throat.   Respiratory:  Positive for cough. Negative for shortness of breath.   Cardiovascular:  Positive for chest pain. Negative for palpitations.  Gastrointestinal:  Positive for abdominal pain, constipation and nausea. Negative for diarrhea and vomiting.  Endocrine: Negative for polydipsia, polyphagia and polyuria.  Genitourinary:  Positive for difficulty urinating. Negative for dysuria.  Musculoskeletal:  Positive for arthralgias (left hip pain), back pain and myalgias.  Skin:  Negative for rash.       Hair loss  Neurological:  Positive for weakness. Negative for headaches.  Psychiatric/Behavioral:  Negative for dysphoric mood. The patient is not nervous/anxious.      Objective:  BP 140/80   Pulse 86   Temp (!) 97.3 F (36.3 C)   Resp 18   Ht '5\' 4"'$  (1.626 m)   Wt 134 lb (60.8 kg)   BMI 23.00 kg/m      02/13/2022   11:07 AM 12/29/2021    3:03 PM 11/28/2021    2:22 PM  BP/Weight  Systolic BP 557 322 025  Diastolic BP 80 68 80  Wt. (Lbs) 134 135 137  BMI 23 kg/m2 23.17 kg/m2 23.52 kg/m2    Physical Exam Vitals reviewed.  Constitutional:      General: She is not in acute distress.    Appearance: Normal appearance. She is normal weight.  Neck:     Thyroid: No thyroid mass.     Vascular: No carotid bruit.  Cardiovascular:     Rate and Rhythm: Normal rate and regular rhythm.     Heart sounds: Normal heart sounds. No murmur heard. Pulmonary:     Effort: Pulmonary effort is normal.     Breath sounds: Normal breath sounds.  Abdominal:     General: Bowel sounds are normal.     Palpations: Abdomen is soft. There is no mass.     Tenderness: There is abdominal tenderness (epigastric.).  Musculoskeletal:        General: Tenderness (left hip) present. Normal range of motion.  Neurological:     Mental Status: She is alert and oriented to person, place, and time.   Psychiatric:        Mood and Affect: Mood normal.        Behavior: Behavior normal.   Hair loss thinning.   Diabetic Foot Exam - Simple   No data filed      Lab Results  Component Value Date   WBC 6.2 02/13/2022   HGB 13.5 02/13/2022   HCT 41.5 02/13/2022   PLT 336 02/13/2022   GLUCOSE 78 02/13/2022   CHOL 184 02/13/2022   TRIG 79 02/13/2022  HDL 74 02/13/2022   LDLCALC 96 02/13/2022   ALT 10 02/13/2022   AST 17 02/13/2022   NA 139 02/13/2022   K 4.2 02/13/2022   CL 99 02/13/2022   CREATININE 0.95 02/13/2022   BUN 10 02/13/2022   CO2 23 02/13/2022   TSH 2.610 02/13/2022   The 10-year ASCVD risk score (Arnett DK, et al., 2019) is: 2.5%   Values used to calculate the score:     Age: 6 years     Sex: Female     Is Non-Hispanic African American: No     Diabetic: No     Tobacco smoker: No     Systolic Blood Pressure: 644 mmHg     Is BP treated: No     HDL Cholesterol: 74 mg/dL     Total Cholesterol: 184 mg/dL    Assessment & Plan:   Problem List Items Addressed This Visit       Cardiovascular and Mediastinum   Superior mesenteric artery stenosis ([place])    Refer to interventional radiology.  Increase Crestor to 40 mg daily Start aspirin 81 mg daily after GI work-up.      Relevant Orders   Ambulatory referral to Interventional Radiology     Respiratory   Centrilobular emphysema ([place]) - Primary    Mild emphysema on CT scan. Minimal shortness of breath.  Wait on treatment.  Will eventually need PFTs.      Relevant Orders   B12 and Folate Panel (Completed)   Methylmalonic acid, serum (Completed)     Digestive   Esophageal dysphagia    Start omeprazole daily.  Refer to GI.  Order barium swallow study      Relevant Orders   SLP modified barium swallow   Ambulatory referral to Gastroenterology   Gastroesophageal reflux disease    Start omeprazole 40 mg daily.      Relevant Medications   omeprazole (PRILOSEC) 40 MG capsule   Other Relevant  Orders   Ambulatory referral to Gastroenterology   Therapeutic opioid-induced constipation (OIC)    otc stool softeners.       Relevant Orders   Ambulatory referral to Gastroenterology     Nervous and Auditory   Multiple sclerosis [facility])    The current medical regimen is fairly effective;  continue present plan and medications.         Musculoskeletal and Integument   Localized osteoporosis without current pathological fracture    Started on Prolia.      Telogen effluvium    Check labs.      Relevant Orders   Ferritin (Completed)     Other   Mixed hyperlipidemia    Not quite at goal of LDL less than 55.  Increase Crestor to 40 mg daily.   Continue to work on eating a healthy diet and exercise.  Labs drawn today.        Relevant Orders   Comprehensive metabolic panel (Completed)   Lipid panel (Completed)   CBC with Differential/Platelet (Completed)   TSH (Completed)   Ambulatory referral to Cardiology   Lipoprotein A (LPA) (Completed)   Vitamin D deficiency    Check vitamin D level.      Relevant Orders   VITAMIN D 25 Hydroxy (Vit-D Deficiency, Fractures) (Completed)   Central sleep apnea syndrome    Continue CPAP.      Epigastric pain    Refer to GI.  Omeprazole started.      Paresthesia    Check labs.  Other chest pain    EKG NSR, NO ST CHANGES.  Patient is at high risk although her chest pain does not sound consistent with angina.      Relevant Orders   Ambulatory referral to Cardiology   EKG 12-Lead (Completed)   Polyuria   Relevant Orders   POCT URINALYSIS DIP (CLINITEK) (Completed)   Urine Culture (Completed)  .  Meds ordered this encounter  Medications   omeprazole (PRILOSEC) 40 MG capsule    Sig: Take 1 capsule (40 mg total) by mouth daily.    Dispense:  30 capsule    Refill:  3    Orders Placed This Encounter  Procedures   Urine Culture   Comprehensive metabolic panel   Lipid panel   CBC with Differential/Platelet    VITAMIN D 25 Hydroxy (Vit-D Deficiency, Fractures)   TSH   B12 and Folate Panel   Methylmalonic acid, serum   Ferritin   Lipoprotein A (LPA)   Cardiovascular Risk Assessment   Ambulatory referral to Gastroenterology   Ambulatory referral to Cardiology   Ambulatory referral to Interventional Radiology   SLP modified barium swallow   POCT URINALYSIS DIP (CLINITEK)   EKG 12-Lead    Total time spent on today's visit was greater than 40 minutes, including both face-to-face time and nonface-to-face time personally spent on review of chart (labs and imaging), discussing labs and goals, discussing further work-up, treatment options, referrals to specialist if needed, reviewing outside records of pertinent, answering patient's questions, and coordinating care.  Follow-up: Return in about 4 weeks (around 03/13/2022) for chronic follow up.  An After Visit Summary was printed and given to the patient.  Rochel Brome, MD [facility] [phone]

## 2022-02-13 ENCOUNTER — Other Ambulatory Visit (HOSPITAL_COMMUNITY): Payer: Self-pay

## 2022-02-13 ENCOUNTER — Encounter: Payer: Self-pay | Admitting: Family Medicine

## 2022-02-13 ENCOUNTER — Ambulatory Visit (INDEPENDENT_AMBULATORY_CARE_PROVIDER_SITE_OTHER): Payer: Medicare HMO | Admitting: Family Medicine

## 2022-02-13 ENCOUNTER — Other Ambulatory Visit: Payer: Self-pay

## 2022-02-13 VITALS — BP 140/80 | HR 86 | Temp 97.3°F | Resp 18 | Ht 64.0 in | Wt 134.0 lb

## 2022-02-13 DIAGNOSIS — R600 Localized edema: Secondary | ICD-10-CM

## 2022-02-13 DIAGNOSIS — K551 Chronic vascular disorders of intestine: Secondary | ICD-10-CM

## 2022-02-13 DIAGNOSIS — R1319 Other dysphagia: Secondary | ICD-10-CM

## 2022-02-13 DIAGNOSIS — R1310 Dysphagia, unspecified: Secondary | ICD-10-CM

## 2022-02-13 DIAGNOSIS — G35 Multiple sclerosis: Secondary | ICD-10-CM

## 2022-02-13 DIAGNOSIS — G4731 Primary central sleep apnea: Secondary | ICD-10-CM | POA: Diagnosis not present

## 2022-02-13 DIAGNOSIS — R3589 Other polyuria: Secondary | ICD-10-CM | POA: Diagnosis not present

## 2022-02-13 DIAGNOSIS — E559 Vitamin D deficiency, unspecified: Secondary | ICD-10-CM

## 2022-02-13 DIAGNOSIS — R202 Paresthesia of skin: Secondary | ICD-10-CM | POA: Insufficient documentation

## 2022-02-13 DIAGNOSIS — T402X5A Adverse effect of other opioids, initial encounter: Secondary | ICD-10-CM

## 2022-02-13 DIAGNOSIS — J432 Centrilobular emphysema: Secondary | ICD-10-CM

## 2022-02-13 DIAGNOSIS — K219 Gastro-esophageal reflux disease without esophagitis: Secondary | ICD-10-CM | POA: Diagnosis not present

## 2022-02-13 DIAGNOSIS — R0789 Other chest pain: Secondary | ICD-10-CM | POA: Diagnosis not present

## 2022-02-13 DIAGNOSIS — E782 Mixed hyperlipidemia: Secondary | ICD-10-CM | POA: Diagnosis not present

## 2022-02-13 DIAGNOSIS — L650 Telogen effluvium: Secondary | ICD-10-CM

## 2022-02-13 DIAGNOSIS — R1032 Left lower quadrant pain: Secondary | ICD-10-CM

## 2022-02-13 DIAGNOSIS — K2100 Gastro-esophageal reflux disease with esophagitis, without bleeding: Secondary | ICD-10-CM | POA: Insufficient documentation

## 2022-02-13 DIAGNOSIS — K5903 Drug induced constipation: Secondary | ICD-10-CM

## 2022-02-13 DIAGNOSIS — M816 Localized osteoporosis [Lequesne]: Secondary | ICD-10-CM | POA: Diagnosis not present

## 2022-02-13 DIAGNOSIS — R1013 Epigastric pain: Secondary | ICD-10-CM

## 2022-02-13 HISTORY — DX: Telogen effluvium: L65.0

## 2022-02-13 HISTORY — DX: Gastro-esophageal reflux disease without esophagitis: K21.9

## 2022-02-13 HISTORY — DX: Paresthesia of skin: R20.2

## 2022-02-13 HISTORY — DX: Other dysphagia: R13.19

## 2022-02-13 LAB — POCT URINALYSIS DIP (CLINITEK)
Blood, UA: NEGATIVE
Glucose, UA: NEGATIVE mg/dL
Nitrite, UA: NEGATIVE
POC PROTEIN,UA: 30 — AB
Spec Grav, UA: >=1.03 — AB (ref 1.010–1.025)
Urobilinogen, UA: NEGATIVE E.U./dL — AB
pH, UA: 5.5 (ref 5.0–8.0)

## 2022-02-13 MED ORDER — OMEPRAZOLE 40 MG PO CPDR: 40.0000 mg | DELAYED_RELEASE_CAPSULE | Freq: Every day | ORAL | 3 refills | Status: DC

## 2022-02-13 NOTE — Patient Instructions (Addendum)
Dysphagia (difficulty swallowing): Started on omeprazole 40 mg daily. Ordering a barium swallow test. Refer to Dr. Lyda Jester (GI)  Checking labs: CBC, TSH, CMP, FLP, vitamin D, B12, folate, MMA, lipoprotein a. (To check everything.)  Superior mesenteric atherosclerosis/aortic atherosclerosis: Refer to interventional radiology to evaluate for possible stenting.  Refer to cardiology to consider cardiac work-up (reassuring the patient is able to walk without significant chest pain).

## 2022-02-14 LAB — LIPOPROTEIN A (LPA): Lipoprotein (a): 224.1 nmol/L — ABNORMAL HIGH (ref <75.0)

## 2022-02-14 LAB — URINE CULTURE: Organism ID, Bacteria: NO GROWTH

## 2022-02-15 DIAGNOSIS — H52213 Irregular astigmatism, bilateral: Secondary | ICD-10-CM | POA: Diagnosis not present

## 2022-02-15 DIAGNOSIS — H04123 Dry eye syndrome of bilateral lacrimal glands: Secondary | ICD-10-CM | POA: Diagnosis not present

## 2022-02-15 DIAGNOSIS — G35 Multiple sclerosis: Secondary | ICD-10-CM | POA: Diagnosis not present

## 2022-02-15 LAB — CBC WITH DIFFERENTIAL/PLATELET
Basophils Absolute: 0.1 10*3/uL (ref 0.0–0.2)
Basos: 1 %
EOS (ABSOLUTE): 0.1 10*3/uL (ref 0.0–0.4)
Eos: 1 %
Hematocrit: 41.5 % (ref 34.0–46.6)
Hemoglobin: 13.5 g/dL (ref 11.1–15.9)
Immature Grans (Abs): 0 10*3/uL (ref 0.0–0.1)
Immature Granulocytes: 0 %
Lymphocytes Absolute: 2.1 10*3/uL (ref 0.7–3.1)
Lymphs: 34 %
MCH: 30.1 pg (ref 26.6–33.0)
MCHC: 32.5 g/dL (ref 31.5–35.7)
MCV: 93 fL (ref 79–97)
Monocytes Absolute: 0.4 10*3/uL (ref 0.1–0.9)
Monocytes: 6 %
Neutrophils Absolute: 3.6 10*3/uL (ref 1.4–7.0)
Neutrophils: 58 %
Platelets: 336 10*3/uL (ref 150–450)
RBC: 4.48 x10E6/uL (ref 3.77–5.28)
RDW: 12.1 % (ref 11.7–15.4)
WBC: 6.2 10*3/uL (ref 3.4–10.8)

## 2022-02-15 LAB — B12 AND FOLATE PANEL
Folate: 6.5 ng/mL (ref >3.0)
Vitamin B-12: 179 pg/mL — ABNORMAL LOW (ref 232–1245)

## 2022-02-15 LAB — TSH: TSH: 2.61 u[IU]/mL (ref 0.450–4.500)

## 2022-02-15 LAB — METHYLMALONIC ACID, SERUM: Methylmalonic Acid: 500 nmol/L — ABNORMAL HIGH (ref 0–378)

## 2022-02-15 LAB — LIPID PANEL
Chol/HDL Ratio: 2.5 ratio (ref 0.0–4.4)
Cholesterol, Total: 184 mg/dL (ref 100–199)
HDL: 74 mg/dL (ref >39)
LDL Chol Calc (NIH): 96 mg/dL (ref 0–99)
Triglycerides: 79 mg/dL (ref 0–149)
VLDL Cholesterol Cal: 14 mg/dL (ref 5–40)

## 2022-02-15 LAB — COMPREHENSIVE METABOLIC PANEL
ALT: 10 IU/L (ref 0–32)
AST: 17 IU/L (ref 0–40)
Albumin/Globulin Ratio: 2.1 (ref 1.2–2.2)
Albumin: 4.6 g/dL (ref 3.8–4.9)
Alkaline Phosphatase: 60 IU/L (ref 44–121)
BUN/Creatinine Ratio: 11 (ref 9–23)
BUN: 10 mg/dL (ref 6–24)
Bilirubin Total: 0.6 mg/dL (ref 0.0–1.2)
CO2: 23 mmol/L (ref 20–29)
Calcium: 9.6 mg/dL (ref 8.7–10.2)
Chloride: 99 mmol/L (ref 96–106)
Creatinine, Ser: 0.95 mg/dL (ref 0.57–1.00)
Globulin, Total: 2.2 g/dL (ref 1.5–4.5)
Glucose: 78 mg/dL (ref 70–99)
Potassium: 4.2 mmol/L (ref 3.5–5.2)
Sodium: 139 mmol/L (ref 134–144)
Total Protein: 6.8 g/dL (ref 6.0–8.5)
eGFR: 69 mL/min/{1.73_m2} (ref >59)

## 2022-02-15 LAB — CARDIOVASCULAR RISK ASSESSMENT

## 2022-02-15 LAB — FERRITIN: Ferritin: 77 ng/mL (ref 15–150)

## 2022-02-15 LAB — VITAMIN D 25 HYDROXY (VIT D DEFICIENCY, FRACTURES): Vit D, 25-Hydroxy: 24.1 ng/mL — ABNORMAL LOW (ref 30.0–100.0)

## 2022-02-15 NOTE — Progress Notes (Signed)
Blood count normal.  Liver function normal.  Kidney function normal.  Thyroid function normal.  Cholesterol: LDL 96. Goal is less than 55. Recommend increase crestor 40 mg daily  Lipoprotein A very high. High risk for heart disease. Referral to cardiology already sent.  Vitamin D low. Recommend take vitamin D 50K twice weekly.  B12 deficiency: recommend B12 1000 mg SQ weekly.  Folate normal. Ferritin normal. Urine culture no growth.

## 2022-02-16 ENCOUNTER — Encounter: Payer: Self-pay | Admitting: Family Medicine

## 2022-02-16 ENCOUNTER — Other Ambulatory Visit: Payer: Self-pay

## 2022-02-16 DIAGNOSIS — F32A Depression, unspecified: Secondary | ICD-10-CM

## 2022-02-16 DIAGNOSIS — M542 Cervicalgia: Secondary | ICD-10-CM | POA: Diagnosis not present

## 2022-02-16 DIAGNOSIS — G35 Multiple sclerosis: Secondary | ICD-10-CM | POA: Diagnosis not present

## 2022-02-16 DIAGNOSIS — M5412 Radiculopathy, cervical region: Secondary | ICD-10-CM | POA: Diagnosis not present

## 2022-02-16 DIAGNOSIS — M5136 Other intervertebral disc degeneration, lumbar region: Secondary | ICD-10-CM | POA: Diagnosis not present

## 2022-02-16 MED ORDER — VITAMIN D (ERGOCALCIFEROL) 1.25 MG (50000 UNIT) PO CAPS: 50000.0000 [IU] | ORAL_CAPSULE | ORAL | 0 refills | Status: DC

## 2022-02-16 MED ORDER — CYANOCOBALAMIN 1000 MCG/ML IJ SOLN: 1000.0000 ug | INTRAMUSCULAR | 0 refills | Status: DC

## 2022-02-18 DIAGNOSIS — R3589 Other polyuria: Secondary | ICD-10-CM | POA: Insufficient documentation

## 2022-02-18 DIAGNOSIS — R0789 Other chest pain: Secondary | ICD-10-CM

## 2022-02-18 DIAGNOSIS — K5903 Drug induced constipation: Secondary | ICD-10-CM

## 2022-02-18 DIAGNOSIS — K551 Chronic vascular disorders of intestine: Secondary | ICD-10-CM

## 2022-02-18 DIAGNOSIS — T402X5A Adverse effect of other opioids, initial encounter: Secondary | ICD-10-CM

## 2022-02-18 HISTORY — DX: Drug induced constipation: K59.03

## 2022-02-18 HISTORY — DX: Other polyuria: R35.89

## 2022-02-18 HISTORY — DX: Other chest pain: R07.89

## 2022-02-18 HISTORY — DX: Adverse effect of other opioids, initial encounter: T40.2X5A

## 2022-02-18 HISTORY — DX: Chronic vascular disorders of intestine: K55.1

## 2022-02-18 NOTE — Assessment & Plan Note (Signed)
Check labs 

## 2022-02-18 NOTE — Assessment & Plan Note (Signed)
>>  ASSESSMENT AND PLAN FOR FAMILIAL HYPERLIPIDEMIA WRITTEN ON 02/18/2022  2:20 PM BY Ido Wollman, MD  Not quite at goal of LDL less than 55.  Increase Crestor to 40 mg daily.   Continue to work on eating a healthy diet and exercise.  Labs drawn today.

## 2022-02-18 NOTE — Assessment & Plan Note (Signed)
EKG NSR, NO ST CHANGES.  Patient is at high risk although her chest pain does not sound consistent with angina.

## 2022-02-18 NOTE — Assessment & Plan Note (Signed)
Not quite at goal of LDL less than 55.  Increase Crestor to 40 mg daily.   Continue to work on eating a healthy diet and exercise.  Labs drawn today.

## 2022-02-18 NOTE — Assessment & Plan Note (Signed)
Continue CPAP.  

## 2022-02-18 NOTE — Assessment & Plan Note (Signed)
Mild emphysema on CT scan. Minimal shortness of breath.  Wait on treatment.  Will eventually need PFTs.

## 2022-02-18 NOTE — Assessment & Plan Note (Signed)
Refer to interventional radiology.  Increase Crestor to 40 mg daily Start aspirin 81 mg daily after GI work-up.

## 2022-02-18 NOTE — Assessment & Plan Note (Signed)
Check vitamin D level 

## 2022-02-18 NOTE — Assessment & Plan Note (Signed)
Started on Prolia.

## 2022-02-18 NOTE — Assessment & Plan Note (Signed)
The current medical regimen is fairly effective;  continue present plan and medications.  

## 2022-02-18 NOTE — Assessment & Plan Note (Signed)
Refer to GI.  Omeprazole started.

## 2022-02-18 NOTE — Assessment & Plan Note (Signed)
Start omeprazole daily.  Refer to GI.  Order barium swallow study

## 2022-02-18 NOTE — Assessment & Plan Note (Signed)
Start omeprazole 40 mg daily

## 2022-02-18 NOTE — Assessment & Plan Note (Signed)
otc stool softeners.

## 2022-02-20 ENCOUNTER — Other Ambulatory Visit: Payer: Self-pay | Admitting: Family Medicine

## 2022-02-20 ENCOUNTER — Telehealth: Payer: Self-pay

## 2022-02-20 DIAGNOSIS — K551 Chronic vascular disorders of intestine: Secondary | ICD-10-CM

## 2022-02-20 NOTE — Telephone Encounter (Signed)
Spoke with April Y. at [facility] about denial for Prolia.  Patient must first try and fail Zoledronic acid.  Call Ref# [id number]

## 2022-02-21 ENCOUNTER — Encounter: Payer: Self-pay | Admitting: *Deleted

## 2022-02-21 ENCOUNTER — Ambulatory Visit (HOSPITAL_COMMUNITY)
Admission: RE | Admit: 2022-02-21 | Discharge: 2022-02-21 | Disposition: A | Discharge status: Home / Self Care | Payer: Medicare HMO | Source: Ambulatory Visit | Attending: Family Medicine | Admitting: Family Medicine

## 2022-02-21 ENCOUNTER — Ambulatory Visit: Payer: Medicare HMO

## 2022-02-21 ENCOUNTER — Ambulatory Visit
Admission: RE | Admit: 2022-02-21 | Discharge: 2022-02-21 | Disposition: A | Discharge status: Home / Self Care | Payer: Medicare HMO | Source: Ambulatory Visit | Attending: Family Medicine | Admitting: Family Medicine

## 2022-02-21 DIAGNOSIS — E785 Hyperlipidemia, unspecified: Secondary | ICD-10-CM | POA: Insufficient documentation

## 2022-02-21 DIAGNOSIS — K219 Gastro-esophageal reflux disease without esophagitis: Secondary | ICD-10-CM | POA: Diagnosis not present

## 2022-02-21 DIAGNOSIS — R1310 Dysphagia, unspecified: Secondary | ICD-10-CM

## 2022-02-21 DIAGNOSIS — K551 Chronic vascular disorders of intestine: Secondary | ICD-10-CM

## 2022-02-21 DIAGNOSIS — K55059 Acute (reversible) ischemia of intestine, part and extent unspecified: Secondary | ICD-10-CM | POA: Diagnosis not present

## 2022-02-21 DIAGNOSIS — G35 Multiple sclerosis: Secondary | ICD-10-CM | POA: Insufficient documentation

## 2022-02-21 DIAGNOSIS — R1319 Other dysphagia: Secondary | ICD-10-CM

## 2022-02-21 DIAGNOSIS — K224 Dyskinesia of esophagus: Secondary | ICD-10-CM | POA: Diagnosis not present

## 2022-02-21 HISTORY — PX: IR RADIOLOGIST EVAL & MGMT: IMG5224

## 2022-02-21 NOTE — Consult Note (Signed)
Chief Complaint: Patient was seen in consultation today for SMA stenosis at the request of Cox,Kirsten  Referring Physician(s): Cox,Kirsten  History of Present Illness: Christina Cantrell is a 61 y.o. female with a history of longstanding multiple sclerosis, hyperlipidemia and prior significant smoking history.  She had a CT of the abdomen and pelvis on 02/03/2022 at [facility] for evaluation of worsening abdominal pain over the last 2 to 3 months.  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  She reports a recent history of worsening severe abdominal pain in the mid abdomen which is highly associated with meals and also sometimes with drinking of liquids.  The pain begins quickly after eating and feels like a "heart attack in her stomach" with pain lasting for approximately 30 minutes to 1 hour.  This has resulted in fear of eating, decreased oral intake and approximately 5 to 6 pounds of weight loss over the last few months.  Pain is now occurring after nearly every meal.  Abdominal pain is clearly worse with larger meals and higher calorie food.  Christina Cantrell has had multiple sclerosis since the age of 50 and uses a chronic fentanyl patch.  She has chronic polyneuropathy and sometimes spasticity related to pain. She quit smoking 7 years ago.  She has had some recent left thigh edema and pain with duplex ultrasound in March demonstrating no evidence of DVT.  Edema has decreased.  She denies any significant claudication symptoms in the lower extremities.  She is taking 81 mg of aspirin daily.  She is followed by Dr. Bettina Gavia for a history of cardiac valvular insufficiency.  Past Medical History:  Diagnosis Date   Multiple sclerosis ([place]) 77   Age 27 yo    Past Surgical History:  Procedure Laterality Date   BREAST BIOPSY Bilateral 08/24/2020   BREAST EXCISIONAL BIOPSY Right 1975     Allergies: Sertraline hcl  Medications: Prior to Admission medications   Medication Sig Start Date End Date Taking? Authorizing Provider  citalopram (CELEXA) 40 MG tablet  08/02/20   [provider]  clobetasol (TEMOVATE) 0.05 % external solution Apply topically. 09/16/20   [provider]  cyanocobalamin (,VITAMIN B-12,) 1000 MCG/ML injection Inject 1 mL (1,000 mcg total) into the muscle once a week. 02/16/22   Rochel Brome, MD  cyclobenzaprine (FLEXERIL) 10 MG tablet  07/31/20   [provider]  dicyclomine (BENTYL) 10 MG capsule Take by mouth.    [provider]  doxycycline (PERIOSTAT) 20 MG tablet Take 20 mg by mouth 2 (two) times daily. 12/07/21   [provider]  fentaNYL (DURAGESIC) 75 MCG/HR     [provider]  ketoconazole (NIZORAL) 2 % shampoo ketoconazole 2 % shampoo  APPLY TO AFFECTED AREA 3 TIMES A WEEK AS DIRECTED    [provider]  Loteprednol Etabonate 0.5 % GEL loteprednol etabonate 0.5 % eye gel drops  INSTILL 1 DROP 4 TIMES EVERY DAY TO BOTH EYES X 1 WEEK THEN TWICE DAILY UNTIL RETURN APPT    [provider]  metroNIDAZOLE (METROCREAM) 0.75 % cream Apply topically 2 (two) times daily. 09/07/21   [provider]  modafinil (PROVIGIL) 100 MG tablet Take by mouth.    [provider]  norethindrone-ethinyl estradiol (FEMHRT 1/5) 1-5 MG-MCG TABS tablet     [provider]  omeprazole (PRILOSEC) 40 MG capsule Take 1 capsule (40 mg total) by  mouth daily. 02/13/22   Rochel Brome, MD  oxyCODONE-acetaminophen (PERCOCET) 10-325 MG tablet  08/13/20   [provider]  rosuvastatin (CRESTOR) 40 MG tablet Take 40 mg by mouth daily.    [provider]  Vitamin D, Ergocalciferol, (DRISDOL) 1.25 MG (50000 UNIT) CAPS capsule Take 1 capsule (50,000 Units total) by mouth 2 (two) times a week. 02/16/22   Rochel Brome, MD     No family history on file.  Social History    Socioeconomic History   Marital status: Married    Spouse name: Not on file   Number of children: Not on file   Years of education: Not on file   Highest education level: Not on file  Occupational History   Not on file  Tobacco Use   Smoking status: Former    Years: 20.00    Types: Cigarettes    Quit date: 2018    Years since quitting: 5.4   Smokeless tobacco: Never  Substance and Sexual Activity   Alcohol use: Never   Drug use: Never   Sexual activity: Not on file  Other Topics Concern   Not on file  Social History Narrative   Not on file   Social Determinants of Health   Financial Resource Strain: Not on file  Food Insecurity: Not on file  Transportation Needs: Not on file  Physical Activity: Not on file  Stress: Not on file  Social Connections: Not on file     Review of Systems: A 12 point ROS discussed and pertinent positives are indicated in the HPI above.  All other systems are negative.  Review of Systems  Constitutional:  Positive for appetite change and unexpected weight change.  Respiratory: Negative.    Cardiovascular:  Positive for leg swelling. Negative for chest pain and palpitations.  Gastrointestinal:  Positive for abdominal pain and constipation. Negative for abdominal distention, anal bleeding, blood in stool, diarrhea, nausea, rectal pain and vomiting.  Genitourinary: Negative.   Musculoskeletal: Negative.   Neurological:        Chronic neuropathy and spasticity related to multiple sclerosis    Vital Signs: BP 130/73 (BP Location: Left Arm)   Pulse 84   SpO2 97%    Physical Exam Constitutional:      General: She is not in acute distress.    Appearance: She is not ill-appearing, toxic-appearing or diaphoretic.  Cardiovascular:     Rate and Rhythm: Normal rate and regular rhythm.     Heart sounds: Normal heart sounds. No murmur heard.    No friction rub. No gallop.  Pulmonary:     Effort: Pulmonary effort is normal. No respiratory  distress.     Breath sounds: Normal breath sounds. No stridor. No wheezing, rhonchi or rales.  Abdominal:     General: There is no distension.     Palpations: Abdomen is soft. There is no mass.     Tenderness: There is no abdominal tenderness. There is no guarding or rebound.  Musculoskeletal:        General: No swelling.     Cervical back: Neck supple.  Lymphadenopathy:     Cervical: No cervical adenopathy.  Skin:    General: Skin is warm and dry.  Neurological:     General: No focal deficit present.     Mental Status: She is alert and oriented to person, place, and time.     Imaging: Leanne Chang OP MEDICARE SPEECH PATH  Result Date: 02/21/2022 CLINICAL DATA:  Reflux symptoms,  history of MS, dysphonia, globus sensation, painful swallowing EXAM: MODIFIED BARIUM SWALLOW TECHNIQUE: Different consistencies of barium were administered orally to the patient by the Speech Pathologist. Imaging of the pharynx was performed in the lateral projection. Christina Boisseau, PA-C was present in the fluoroscopy room during this study, which was supervised and interpreted by Maurine Simmering, MD. FLUOROSCOPY: Radiation Exposure Index (as provided by the fluoroscopic device): 20.83 mGy Kerma COMPARISON:  None Available. FINDINGS: Vestibular Penetration: Trace, single-episode amongst multiple trials, with thin barium. Aspiration:  None seen. Other: Moderate esophageal dysmotility. IMPRESSION: No intratracheal aspiration. Moderate esophageal dysmotility. Please refer to the Speech Pathologists report for complete details and recommendations. Electronically Signed   By: Maurine Simmering M.D.   On: 02/21/2022 13:10    Labs:  CBC: Recent Labs    06/27/21 0758 10/04/21 1128 02/13/22 1159  WBC 6.9 7.9 6.2  HGB 14.1 14.5 13.5  HCT 41.6 43.6 41.5  PLT 334 328 336    COAGS: No results for input(s): "INR", "APTT" in the last 8760 hours.  BMP: Recent Labs    06/27/21 0758 10/04/21 1128 02/13/22 1159  NA 142  142 139  K 4.3 4.0 4.2  CL 104 103 99  CO2 _0 GLUCOSE 78 75 78  BUN _1 CALCIUM 9.3 9.7 9.6  CREATININE 0.81 0.90 0.95    LIVER FUNCTION TESTS: Recent Labs    06/27/21 0758 10/04/21 1128 02/13/22 1159  BILITOT 0.4 0.5 0.6  AST _2 ALT _3 ALKPHOS 50 62 60  PROT 6.5 6.6 6.8  ALBUMIN 4.5 4.4 4.6     Assessment and Plan:  I met with Christina Cantrell and her husband.  I reviewed findings with them from the CT of the abdomen and pelvis on 02/03/2022.  This study is adequate for arterial evaluation due to good opacification of the abdominal aorta and visceral arteries.  There is evidence of a high-grade stenosis involving the origin and proximal aspect of the superior mesenteric artery estimated to be 80% or greater in maximal narrowing.  The stenosis is caused by eccentric noncalcified plaque primarily along the superior aspect of the proximal artery.  The celiac axis and inferior mesenteric artery appear to be normally patent and without significant disease.  There is atherosclerosis of the abdominal aorta without aneurysm and atherosclerosis of the common iliac arteries bilaterally.  No evidence by CT to suggest significant acute bowel ischemia.  Given her symptoms of clearly progressing intestinal angina with ingestion of food and liquids over the last few months which has resulted in weight loss and is now occurring with nearly every meal, there is an indication to treat the SMA stenosis.  The stenosis appears amenable to percutaneous balloon assisted stenting based on location and morphology.  After discussion of technical details as well as risks, Christina Cantrell would like to proceed with scheduling of a mesenteric arteriogram with stenting of the superior mesenteric artery.  We will begin the scheduling process at [facility].  Thank you for this interesting consult.  I greatly enjoyed meeting Christina Cantrell and look forward to participating in their care.  A  copy of this report was sent to the requesting provider on this date.  Electronically Signed: Azzie Roup 02/21/2022, 4:31 PM    I spent a total of 40 Minutes in face to face in clinical consultation, greater than 50% of which was counseling/coordinating care for significant SMA stenosis resulting in  intestinal angina.

## 2022-02-22 ENCOUNTER — Ambulatory Visit: Payer: Medicare HMO

## 2022-02-22 ENCOUNTER — Other Ambulatory Visit: Payer: Self-pay | Admitting: Interventional Radiology

## 2022-02-23 ENCOUNTER — Other Ambulatory Visit (HOSPITAL_COMMUNITY): Payer: Self-pay | Admitting: Interventional Radiology

## 2022-02-23 DIAGNOSIS — K551 Chronic vascular disorders of intestine: Secondary | ICD-10-CM

## 2022-02-27 ENCOUNTER — Ambulatory Visit
Admission: RE | Admit: 2022-02-27 | Discharge: 2022-02-27 | Disposition: A | Discharge status: Home / Self Care | Payer: Medicare HMO | Source: Ambulatory Visit | Attending: Surgery | Admitting: Surgery

## 2022-02-27 DIAGNOSIS — Z1231 Encounter for screening mammogram for malignant neoplasm of breast: Secondary | ICD-10-CM | POA: Diagnosis not present

## 2022-03-01 ENCOUNTER — Other Ambulatory Visit: Payer: Self-pay | Admitting: Student

## 2022-03-01 ENCOUNTER — Other Ambulatory Visit: Payer: Self-pay | Admitting: Radiology

## 2022-03-01 DIAGNOSIS — K551 Chronic vascular disorders of intestine: Secondary | ICD-10-CM

## 2022-03-02 ENCOUNTER — Other Ambulatory Visit (HOSPITAL_COMMUNITY): Payer: Self-pay | Admitting: Interventional Radiology

## 2022-03-02 ENCOUNTER — Other Ambulatory Visit: Payer: Self-pay

## 2022-03-02 ENCOUNTER — Encounter (HOSPITAL_COMMUNITY): Payer: Self-pay

## 2022-03-02 ENCOUNTER — Ambulatory Visit (HOSPITAL_COMMUNITY)
Admission: RE | Admit: 2022-03-02 | Discharge: 2022-03-02 | Disposition: A | Discharge status: Home / Self Care | Payer: Medicare HMO | Source: Ambulatory Visit | Attending: Interventional Radiology | Admitting: Interventional Radiology

## 2022-03-02 DIAGNOSIS — G35 Multiple sclerosis: Secondary | ICD-10-CM | POA: Insufficient documentation

## 2022-03-02 DIAGNOSIS — E785 Hyperlipidemia, unspecified: Secondary | ICD-10-CM | POA: Diagnosis not present

## 2022-03-02 DIAGNOSIS — Z87891 Personal history of nicotine dependence: Secondary | ICD-10-CM | POA: Diagnosis not present

## 2022-03-02 DIAGNOSIS — K551 Chronic vascular disorders of intestine: Secondary | ICD-10-CM

## 2022-03-02 DIAGNOSIS — K55059 Acute (reversible) ischemia of intestine, part and extent unspecified: Secondary | ICD-10-CM | POA: Diagnosis not present

## 2022-03-02 HISTORY — PX: IR ANGIOGRAM VISCERAL SELECTIVE: IMG657

## 2022-03-02 HISTORY — PX: IR TRANSCATH PLC STENT 1ST ART NOT LE CV CAR VERT CAR: IMG5443

## 2022-03-02 HISTORY — PX: IR US GUIDE VASC ACCESS RIGHT: IMG2390

## 2022-03-02 LAB — CBC
HCT: 37.3 % (ref 36.0–46.0)
Hemoglobin: 12.7 g/dL (ref 12.0–15.0)
MCH: 31.6 pg (ref 26.0–34.0)
MCHC: 34 g/dL (ref 30.0–36.0)
MCV: 92.8 fL (ref 80.0–100.0)
Platelets: 319 10*3/uL (ref 150–400)
RBC: 4.02 MIL/uL (ref 3.87–5.11)
RDW: 12.3 % (ref 11.5–15.5)
WBC: 6.9 10*3/uL (ref 4.0–10.5)
nRBC: 0 % (ref 0.0–0.2)

## 2022-03-02 LAB — BASIC METABOLIC PANEL
Anion gap: 7 (ref 5–15)
BUN: 13 mg/dL (ref 6–20)
CO2: 27 mmol/L (ref 22–32)
Calcium: 8.9 mg/dL (ref 8.9–10.3)
Chloride: 104 mmol/L (ref 98–111)
Creatinine, Ser: 0.87 mg/dL (ref 0.44–1.00)
GFR, Estimated: >60 mL/min (ref >60)
Glucose, Bld: 89 mg/dL (ref 70–99)
Potassium: 5.6 mmol/L — ABNORMAL HIGH (ref 3.5–5.1)
Sodium: 138 mmol/L (ref 135–145)

## 2022-03-02 LAB — PROTIME-INR
INR: 1 (ref 0.8–1.2)
Prothrombin Time: 13.5 seconds (ref 11.4–15.2)

## 2022-03-02 MED ORDER — LIDOCAINE HCL 1 % IJ SOLN
INTRAMUSCULAR | Status: AC
  Administered 2022-03-02: 10 mL
  Filled 2022-03-02: qty 20

## 2022-03-02 MED ORDER — SODIUM CHLORIDE 0.9 % IV SOLN: INTRAVENOUS | Status: DC

## 2022-03-02 MED ORDER — IOHEXOL 300 MG/ML  SOLN
100.0000 mL | Freq: Once | INTRAMUSCULAR | Status: AC | PRN
  Administered 2022-03-02: 32 mL via INTRA_ARTERIAL

## 2022-03-02 MED ORDER — MIDAZOLAM HCL 2 MG/2ML IJ SOLN
INTRAMUSCULAR | Status: AC
  Filled 2022-03-02: qty 4

## 2022-03-02 MED ORDER — MIDAZOLAM HCL 2 MG/2ML IJ SOLN
INTRAMUSCULAR | Status: AC | PRN
  Administered 2022-03-02 (×2): 1 mg via INTRAVENOUS
  Administered 2022-03-02: .5 mg via INTRAVENOUS
  Administered 2022-03-02 (×2): 1 mg via INTRAVENOUS

## 2022-03-02 MED ORDER — HEPARIN SODIUM (PORCINE) 1000 UNIT/ML IJ SOLN
INTRAMUSCULAR | Status: AC
  Filled 2022-03-02: qty 10

## 2022-03-02 MED ORDER — FENTANYL CITRATE (PF) 100 MCG/2ML IJ SOLN
INTRAMUSCULAR | Status: AC
  Filled 2022-03-02: qty 4

## 2022-03-02 MED ORDER — HEPARIN SODIUM (PORCINE) 1000 UNIT/ML IJ SOLN
INTRAMUSCULAR | Status: AC | PRN
  Administered 2022-03-02: 3000 [IU] via INTRAVENOUS

## 2022-03-02 MED ORDER — CLOPIDOGREL BISULFATE 75 MG PO TABS: 75.0000 mg | ORAL_TABLET | Freq: Every day | ORAL | 1 refills | Status: DC

## 2022-03-02 MED ORDER — MIDAZOLAM HCL 2 MG/2ML IJ SOLN
INTRAMUSCULAR | Status: AC
  Filled 2022-03-02: qty 2

## 2022-03-02 MED ORDER — FENTANYL CITRATE (PF) 100 MCG/2ML IJ SOLN
INTRAMUSCULAR | Status: AC | PRN
  Administered 2022-03-02: 50 ug via INTRAVENOUS
  Administered 2022-03-02: 25 ug via INTRAVENOUS
  Administered 2022-03-02: 50 ug via INTRAVENOUS

## 2022-03-02 NOTE — Procedures (Signed)
Interventional Radiology Procedure Note  Procedure: Aortic and SMA arteriography; stenting of proximal SMA  Complications: None  Estimated Blood Loss: 10 mL  Findings: Critical ostial stenosis of SMA confirmed by arteriography. Treated with balloon expandable Gore VBX stent, 7 x 19 mm with no residual stenosis after stent deployment.   Closure: Angioseal  Plan: 4 hour recovery the discharge. Clinic follow up in one month.  Venetia Night. Kathlene Cote, M.D Pager:  [phone]

## 2022-03-02 NOTE — Progress Notes (Signed)
Rx for Plavix '75mg'$  PO daily called into [facility] [place], #90 with 1 refill per Dr. Kathlene Cote.   Brynda Greathouse, MS RD PA-C

## 2022-03-02 NOTE — H&P (Signed)
Chief Complaint: Patient was seen in consultation today for mesenteric artery syndrome  Referring Physician(s): Dr. Dirk Dress  Supervising Physician: Aletta Edouard  Patient Status: [facility] - Out-pt  History of Present Illness: Christina Cantrell is a 61 y.o. female  with a history of longstanding multiple sclerosis, hyperlipidemia and prior significant smoking history.  She had a CT of the abdomen and pelvis on 02/03/2022 at [facility] for evaluation of worsening abdominal pain over the last 2 to 3 months.  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  She met with Dr. 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     Ms. Dehaan presents to [facility] Radiology today in her usual state of health.  She has been NPO and states her last PO intake was last PM (a popcicle) and she has had abdominal pain, rated as 6-7/10 since.  She does have a 33mg Fentanyl patch which she replaced this AM.     Past Medical History:  Diagnosis Date   Multiple sclerosis ([place] 178  Age 61yo    Past Surgical History:  Procedure Laterality Date   BREAST BIOPSY Bilateral 08/24/2020   BREAST EXCISIONAL BIOPSY Right 1975   IR RADIOLOGIST EVAL & MGMT  02/21/2022    Allergies: Sertraline hcl  Medications: Prior to Admission medications   Medication Sig Start Date End Date Taking? Authorizing Provider  albuterol (VENTOLIN HFA) 108 (90 Base) MCG/ACT inhaler Inhale 2 puffs into the lungs every 6 (six) hours as needed for wheezing or shortness of breath.   Yes [provider]  aspirin EC 81 MG tablet Take 81 mg by mouth daily. Swallow whole.   Yes [provider]  citalopram (CELEXA) 40 MG tablet Take 40 mg by mouth daily. 08/02/20  Yes [provider]  cyanocobalamin (,VITAMIN B-12,) 1000 MCG/ML injection Inject 1  mL (1,000 mcg total) into the muscle once a week. 02/16/22  Yes Cox, Kirsten, MD  cyclobenzaprine (FLEXERIL) 10 MG tablet Take 10 mg by mouth 3 (three) times daily as needed for muscle spasms. 07/31/20  Yes [provider]  doxycycline (PERIOSTAT) 20 MG tablet Take 20 mg by mouth 2 (two) times daily. 12/07/21  Yes [provider]  fentaNYL (DURAGESIC) 75 MCG/HR Place 1 patch onto the skin 2 days.   Yes [provider]  ketoconazole (NIZORAL) 2 % shampoo Apply 1 Application topically daily as needed for irritation.   Yes [provider]  Loteprednol Etabonate 0.5 % GEL Place 1 drop into both eyes daily as needed (optic neuritis).   Yes [provider]  metroNIDAZOLE (METROCREAM) 0.75 % cream Apply 1 Application topically 2 (two) times daily. 09/07/21  Yes [provider]  modafinil (PROVIGIL) 100 MG tablet Take 200 mg by mouth daily as needed (narcolepsy).   Yes [provider]  norethindrone-ethinyl estradiol (FYAVOLV) 1-5 MG-MCG TABS tablet Take 1 tablet by mouth daily.   Yes [provider]  omeprazole (PRILOSEC) 40 MG capsule Take 1 capsule (40 mg total) by mouth daily. Patient taking differently: Take 40 mg by mouth daily as needed (heartburn). 02/13/22  Yes Cox, Kirsten, MD  oxyCODONE-acetaminophen (PERCOCET) 10-325 MG tablet Take 1 tablet by mouth every 4 (four) hours as needed for pain. 08/13/20  Yes [provider]  Polyethyl Glycol-Propyl Glycol (SYSTANE OP) Place 1 drop into both eyes daily as needed (dry  eyes).   Yes [provider]  rosuvastatin (CRESTOR) 20 MG tablet Take 30 mg by mouth at bedtime.   Yes [provider]  Vitamin D, Ergocalciferol, (DRISDOL) 1.25 MG (50000 UNIT) CAPS capsule Take 1 capsule (50,000 Units total) by mouth 2 (two) times a week. 02/16/22  Yes CoxElnita Maxwell, MD     History reviewed. No pertinent family history.  Social History   Socioeconomic History   Marital  status: Married    Spouse name: Not on file   Number of children: Not on file   Years of education: Not on file   Highest education level: Not on file  Occupational History   Not on file  Tobacco Use   Smoking status: Former    Years: 20.00    Types: Cigarettes    Quit date: 2018    Years since quitting: 5.4   Smokeless tobacco: Never  Substance and Sexual Activity   Alcohol use: Never   Drug use: Never   Sexual activity: Not on file  Other Topics Concern   Not on file  Social History Narrative   Not on file   Social Determinants of Health   Financial Resource Strain: Not on file  Food Insecurity: Not on file  Transportation Needs: Not on file  Physical Activity: Not on file  Stress: Not on file  Social Connections: Not on file     Review of Systems: A 12 point ROS discussed and pertinent positives are indicated in the HPI above.  All other systems are negative.  Review of Systems  Constitutional:  Negative for fatigue and fever.  Respiratory:  Negative for cough and shortness of breath.   Gastrointestinal:  Positive for abdominal pain and nausea. Negative for diarrhea and vomiting.  Genitourinary:  Negative for dysuria.  Neurological:  Negative for dizziness, weakness and headaches.  Psychiatric/Behavioral:  Negative for behavioral problems and confusion.     Vital Signs: BP 129/85   Pulse 79   Temp 98.9 F (37.2 C) (Oral)   Resp 20   Ht _0  (1.626 m)   Wt 135 lb (61.2 kg)   SpO2 100%   BMI 23.17 kg/m   Physical Exam Vitals and nursing note reviewed.  Constitutional:      General: She is not in acute distress.    Appearance: Normal appearance. She is not ill-appearing.  HENT:     Mouth/Throat:     Mouth: Mucous membranes are moist.     Pharynx: Oropharynx is clear.  Cardiovascular:     Rate and Rhythm: Normal rate and regular rhythm.  Pulmonary:     Effort: Pulmonary effort is normal.     Breath sounds: Normal breath sounds.  Abdominal:      General: Abdomen is flat. There is no distension.     Palpations: Abdomen is soft. There is no mass.     Tenderness: There is abdominal tenderness.  Skin:    General: Skin is warm and dry.  Neurological:     General: No focal deficit present.     Mental Status: She is alert and oriented to person, place, and time. Mental status is at baseline.      MD Evaluation Airway: WNL Heart: WNL Abdomen: WNL Chest/ Lungs: WNL ASA  Classification: 3 Mallampati/Airway Score: Two   Imaging: MM 3D SCREEN BREAST BILATERAL  Result Date: 02/28/2022 CLINICAL DATA:  Screening. EXAM: DIGITAL SCREENING BILATERAL MAMMOGRAM WITH TOMOSYNTHESIS AND CAD TECHNIQUE: Bilateral screening digital craniocaudal and mediolateral oblique mammograms  were obtained. Bilateral screening digital breast tomosynthesis was performed. The images were evaluated with computer-aided detection. COMPARISON:  Previous exam(s). ACR Breast Density Category b: There are scattered areas of fibroglandular density. FINDINGS: There are no findings suspicious for malignancy. IMPRESSION: No mammographic evidence of malignancy. A result letter of this screening mammogram will be mailed directly to the patient. RECOMMENDATION: Screening mammogram in one year. (Code:SM-B-01Y) BI-RADS CATEGORY  1: Negative. Electronically Signed   By: Fidela Salisbury M.D.   On: 02/28/2022 14:57   IR Radiologist Eval & Mgmt  Result Date: 02/21/2022 Please refer to notes tab for details about interventional procedure. (Op Note)  DG SWALLOW FUNC OP MEDICARE SPEECH PATH  Result Date: 02/21/2022 Table formatting from the original result was not included. Images from the original result were not included. Objective Swallowing Evaluation: Type of Study: MBS-Modified Barium Swallow Study  Patient Details Name: QUANIYAH BUGH MRN: [id number] Date of Birth: Sep 29, 1960 Today's Date: 02/21/2022 Time: SLP Start Time (ACUTE ONLY): 1145 -SLP Stop Time (ACUTE ONLY): 1215 SLP  Time Calculation (min) (ACUTE ONLY): 30 min Past Medical History: Past Medical History: Diagnosis Date  Multiple sclerosis ([place]) 53  Age 77 yo Past Surgical History: Past Surgical History: Procedure Laterality Date  BREAST BIOPSY Bilateral 08/24/2020  BREAST EXCISIONAL BIOPSY Right 2829 HPI: 61 year old female with hx of multiple sclerosis, hyperlipidemia, ACDF, GERD was referred for OP MBS due to complaints of worsening swallowing over the last year,  solids > liquids. She reports sensation of food lodging in throat, pain upon swallowing, frequent regurgitation, globus.  She reports having orders for esophagram as well.  Subjective: communicative  Recommendations for follow up therapy are one component of a multi-disciplinary discharge planning process, led by the attending physician.  Recommendations may be updated based on patient status, additional functional criteria and insurance authorization. Assessment / Plan / Recommendation   02/21/2022   4:00 PM Clinical Impressions Clinical Impression Pt presented with normal oropharyngeal swallow marked by thorough mastication with good bolus cohesion, timely onset of pharyngeal swallow, normal stripping wave, mild vallecular retention of thin liquids (<25%), transient penetration x1 (PAS score of 2, considered WNL). There was no aspiration.  There was no lodging of solids in the pyriform space; patent opening of UES.  Given pt's complaints, recommend esophagram. No SLP f/u needed. SLP Visit Diagnosis Dysphagia, unspecified (R13.10) Impact on safety and function No limitations     02/21/2022   4:00 PM Treatment Recommendations Treatment Recommendations No treatment recommended at this time      No data to display      02/21/2022   4:00 PM Diet Recommendations SLP Diet Recommendations Regular solids;Thin liquid Liquid Administration via Straw;Cup Medication Administration Whole meds with liquid     02/21/2022   4:00 PM Other Recommendations Oral Care Recommendations Oral  care BID Follow Up Recommendations No SLP follow up    No data to display        02/21/2022   4:00 PM Oral Phase Oral Phase [facility]    02/21/2022   4:00 PM Pharyngeal Phase Pharyngeal Phase WFL     No data to display    Juan Quam Laurice 02/21/2022, 4:36 PM                   CLINICAL DATA:  Reflux symptoms, history of MS, dysphonia, globus sensation, painful swallowing EXAM: MODIFIED BARIUM SWALLOW TECHNIQUE: Different consistencies of barium were administered orally to the patient by the Speech Pathologist. Imaging of the pharynx was  performed in the lateral projection. Hayley Boisseau, PA-C was present in the fluoroscopy room during this study, which was supervised and interpreted by Maurine Simmering, MD. FLUOROSCOPY: Radiation Exposure Index (as provided by the fluoroscopic device): 20.83 mGy Kerma COMPARISON:  None Available. FINDINGS: Vestibular Penetration: Trace, single-episode amongst multiple trials, with thin barium. Aspiration:  None seen. Other: Moderate esophageal dysmotility. IMPRESSION: No intratracheal aspiration. Moderate esophageal dysmotility. Please refer to the Speech Pathologists report for complete details and recommendations. Electronically Signed   By: Maurine Simmering M.D.   On: 02/21/2022 13:10    Labs:  CBC: Recent Labs    06/27/21 0758 10/04/21 1128 02/13/22 1159 03/02/22 0752  WBC 6.9 7.9 6.2 6.9  HGB 14.1 14.5 13.5 12.7  HCT 41.6 43.6 41.5 37.3  PLT 334 328 336 319    COAGS: No results for input(s): "INR", "APTT" in the last 8760 hours.  BMP: Recent Labs    06/27/21 0758 10/04/21 1128 02/13/22 1159 03/02/22 0752  NA 142 142 139 138  K 4.3 4.0 4.2 5.6*  CL 104 103 99 104  CO2 _0 GLUCOSE 78 75 78 89  BUN _1 CALCIUM 9.3 9.7 9.6 8.9  CREATININE 0.81 0.90 0.95 0.87  GFRNONAA  --   --   --  >60    LIVER FUNCTION TESTS: Recent Labs    06/27/21 0758 10/04/21 1128 02/13/22 1159  BILITOT 0.4 0.5 0.6  AST _2 ALT _3 ALKPHOS 50 62 60   PROT 6.5 6.6 6.8  ALBUMIN 4.5 4.4 4.6    TUMOR MARKERS: No results for input(s): "AFPTM", "CEA", "CA199", "CHROMGRNA" in the last 8760 hours.  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  Case reviewed by Dr. Kathlene Cote who has seen the patietn in consultation and who approves patient for procedure.  Patient presents today in their usual state of health.  She has been NPO and is not currently on blood thinners.  Risks and benefits of angiogram with intervention were discussed with the patient including, but not limited to bleeding, infection, vascular injury or contrast induced renal failure.  This interventional procedure involves the use of X-rays and because of the nature of the planned procedure, it is possible that we will have prolonged use of X-ray fluoroscopy.  Potential radiation risks to you include (but are not limited to) the following: - A slightly elevated risk for cancer  several years later in life. This risk is typically less than 0.5% percent. This risk is low in comparison to the normal incidence of human cancer, which is 33% for women and 50% for men according to the [place]. - Radiation induced injury can include skin redness, resembling a rash, tissue breakdown / ulcers and hair loss (which can be temporary or permanent).   The likelihood of either of these occurring depends on the difficulty of the procedure and whether you are sensitive to radiation due to previous procedures, disease, or genetic conditions.   IF your procedure requires a prolonged use of radiation, you will be notified and given written instructions for further action.  It is your responsibility to monitor the irradiated area for the 2 weeks following the procedure and to notify your physician if you are  concerned that you have suffered a radiation  induced injury.    All of the patient's questions were answered, patient is agreeable to proceed.  Consent signed and in chart.  Thank you for this interesting consult.  I greatly enjoyed meeting RIA REDCAY and look forward to participating in their care.  A copy of this report was sent to the requesting provider on this date.  Electronically Signed: Docia Barrier, PA 03/02/2022, 8:30 AM   I spent a total of    15 Minutes in face to face in clinical consultation, greater than 50% of which was counseling/coordinating care for mesenteric artery syndrome.

## 2022-03-02 NOTE — Sedation Documentation (Signed)
Attempted to call SBAR to SS. Will need to call back.

## 2022-03-09 ENCOUNTER — Other Ambulatory Visit: Payer: Self-pay | Admitting: Family Medicine

## 2022-03-09 DIAGNOSIS — R109 Unspecified abdominal pain: Secondary | ICD-10-CM | POA: Diagnosis not present

## 2022-03-09 DIAGNOSIS — K551 Chronic vascular disorders of intestine: Secondary | ICD-10-CM | POA: Diagnosis not present

## 2022-03-09 DIAGNOSIS — E538 Deficiency of other specified B group vitamins: Secondary | ICD-10-CM | POA: Diagnosis not present

## 2022-03-09 DIAGNOSIS — R1310 Dysphagia, unspecified: Secondary | ICD-10-CM | POA: Diagnosis not present

## 2022-03-13 ENCOUNTER — Encounter: Payer: Self-pay | Admitting: Family Medicine

## 2022-03-13 ENCOUNTER — Ambulatory Visit (INDEPENDENT_AMBULATORY_CARE_PROVIDER_SITE_OTHER): Payer: Medicare HMO | Admitting: Family Medicine

## 2022-03-13 VITALS — BP 130/84 | HR 92 | Temp 97.8°F | Ht 64.0 in | Wt 136.0 lb

## 2022-03-13 DIAGNOSIS — R0789 Other chest pain: Secondary | ICD-10-CM | POA: Diagnosis not present

## 2022-03-13 DIAGNOSIS — R1319 Other dysphagia: Secondary | ICD-10-CM

## 2022-03-13 DIAGNOSIS — K551 Chronic vascular disorders of intestine: Secondary | ICD-10-CM

## 2022-03-13 DIAGNOSIS — E782 Mixed hyperlipidemia: Secondary | ICD-10-CM | POA: Diagnosis not present

## 2022-03-13 NOTE — Assessment & Plan Note (Addendum)
Call GI and move UGI sooner.  Continue omeprazole 40 mg daily until prevacid approved. .  Recommend change omeprazole to prevacid due to plavix.

## 2022-03-13 NOTE — Assessment & Plan Note (Addendum)
EKG: NSR. No ST changes.  Order troponin I Stat.

## 2022-03-13 NOTE — Patient Instructions (Addendum)
Change omeprazole to prevacid 30 mg once daily. Use omeprazole until new prescription approved.    Call Dr. Lyda Jester to move up upper GI.  Move plavix to pm dosing.

## 2022-03-13 NOTE — Assessment & Plan Note (Signed)
>>  ASSESSMENT AND PLAN FOR FAMILIAL HYPERLIPIDEMIA WRITTEN ON 03/13/2022  9:43 PM BY Izrael Peak, MD  Well controlled.  No changes to medicines. Continue rosuvastatin 20 mg before bed. Continue to work on eating a healthy diet and exercise.

## 2022-03-13 NOTE — Assessment & Plan Note (Signed)
Stented. Continue plavix, aspirin, and crestor.

## 2022-03-13 NOTE — Progress Notes (Signed)
Subjective:  Patient ID: Christina Cantrell, female    DOB: 1961/05/19  Age: 61 y.o. MRN: [id number]  HPI: Patient had chest pain for 3 days over the weekend. Substernal. Dyspnea, diaphoresis. No nausea.  On 03/09/22 saw GI and complaining of chest pain and burped and it burn. Kept her on omeprazole, but patient was confused about concern for taking omeprazole with plavix. May have stopped it for a few days.  Patient went to cardiology straight from GI. Cardiology recommended patient go straight to ED.  Esophageal Dysphagia: Patient was started on Omeprazole '40mg'$  daily and referred to GI to see Dr. Lyda Jester. Ordered Barium swallow.  Superior mesenteric artery stenosis: Patient was referred to interventional radiology, and a stent was placed on 03/02/22. Increased Crestor to 40 mg daily Start aspirin 81 mg daily after GI work-up. Started on plavix 75 mg once daily.   B12 shots x 3 since diagnosed with b12 deficiency. Doing shots at home.    Current Outpatient Medications on File Prior to Visit  Medication Sig Dispense Refill   albuterol (VENTOLIN HFA) 108 (90 Base) MCG/ACT inhaler Inhale 2 puffs into the lungs every 6 (six) hours as needed for wheezing or shortness of breath.     aspirin EC 81 MG tablet Take 81 mg by mouth daily. Swallow whole.     citalopram (CELEXA) 40 MG tablet Take 40 mg by mouth daily.     clopidogrel (PLAVIX) 75 MG tablet Take 1 tablet (75 mg total) by mouth daily. 90 tablet 1   cyanocobalamin (,VITAMIN B-12,) 1000 MCG/ML injection INJECT 1 ML (1,000 MCG TOTAL) INTO THE MUSCLE ONCE A WEEK. 4 mL 0   cyclobenzaprine (FLEXERIL) 10 MG tablet Take 10 mg by mouth 3 (three) times daily as needed for muscle spasms.     doxycycline (PERIOSTAT) 20 MG tablet Take 20 mg by mouth 2 (two) times daily.     fentaNYL (DURAGESIC) 75 MCG/HR Place 1 patch onto the skin 2 days.     ketoconazole (NIZORAL) 2 % shampoo Apply 1 Application topically daily as needed for irritation.      Loteprednol Etabonate 0.5 % GEL Place 1 drop into both eyes daily as needed (optic neuritis).     metroNIDAZOLE (METROCREAM) 0.75 % cream Apply 1 Application topically 2 (two) times daily.     modafinil (PROVIGIL) 100 MG tablet Take 200 mg by mouth daily as needed (narcolepsy).     norethindrone-ethinyl estradiol (FYAVOLV) 1-5 MG-MCG TABS tablet Take 1 tablet by mouth daily.     omeprazole (PRILOSEC) 40 MG capsule Take 1 capsule (40 mg total) by mouth daily. (Patient taking differently: Take 40 mg by mouth daily as needed (heartburn).) 30 capsule 3   oxyCODONE-acetaminophen (PERCOCET) 10-325 MG tablet Take 1 tablet by mouth every 4 (four) hours as needed for pain.     Polyethyl Glycol-Propyl Glycol (SYSTANE OP) Place 1 drop into both eyes daily as needed (dry eyes).     rosuvastatin (CRESTOR) 20 MG tablet Take 30 mg by mouth at bedtime.     Vitamin D, Ergocalciferol, (DRISDOL) 1.25 MG (50000 UNIT) CAPS capsule Take 1 capsule (50,000 Units total) by mouth 2 (two) times a week. 24 capsule 0   No current facility-administered medications on file prior to visit.   Past Medical History:  Diagnosis Date   Multiple sclerosis ([place]) 78   Age 60 yo   Past Surgical History:  Procedure Laterality Date   BREAST BIOPSY Bilateral 08/24/2020   BREAST EXCISIONAL BIOPSY  Right 1975   IR ANGIOGRAM VISCERAL SELECTIVE  03/02/2022   IR RADIOLOGIST EVAL & MGMT  02/21/2022   IR TRANSCATH PLC STENT 1ST ART NOT LE CV CAR VERT CAR  03/02/2022   IR US GUIDE VASC ACCESS RIGHT  03/02/2022    History reviewed. No pertinent family history. Social History   Socioeconomic History   Marital status: Married    Spouse name: Not on file   Number of children: Not on file   Years of education: Not on file   Highest education level: Not on file  Occupational History   Not on file  Tobacco Use   Smoking status: Former    Years: 20.00    Types: Cigarettes    Quit date: 2018    Years since quitting: 5.5   Smokeless  tobacco: Never  Substance and Sexual Activity   Alcohol use: Never   Drug use: Never   Sexual activity: Not on file  Other Topics Concern   Not on file  Social History Narrative   Not on file   Social Determinants of Health   Financial Resource Strain: Not on file  Food Insecurity: Not on file  Transportation Needs: Not on file  Physical Activity: Not on file  Stress: Not on file  Social Connections: Not on file    Review of Systems  Constitutional:  Negative for appetite change, fatigue and fever.  HENT:  Negative for congestion, ear pain, sinus pressure and sore throat.   Respiratory:  Positive for chest tightness. Negative for cough, shortness of breath and wheezing.   Cardiovascular:  Negative for chest pain and palpitations.  Gastrointestinal:  Positive for constipation. Negative for abdominal pain, diarrhea, nausea and vomiting.  Genitourinary:  Negative for dysuria and hematuria.  Musculoskeletal:  Negative for arthralgias, back pain, joint swelling and myalgias.  Skin:  Negative for rash.  Neurological:  Negative for dizziness, weakness and headaches.  Psychiatric/Behavioral:  Negative for dysphoric mood. The patient is not nervous/anxious.      Objective:  BP 130/84 (BP Location: Left Arm, Patient Position: Sitting)   Pulse 92   Temp 97.8 F (36.6 C) (Oral)   Ht '5\' 4"'$  (1.626 m)   Wt 136 lb (61.7 kg)   SpO2 98%   BMI 23.34 kg/m      03/13/2022    1:39 PM 03/02/2022    1:45 PM 03/02/2022   12:45 PM  BP/Weight  Systolic BP 716 967 893  Diastolic BP 84 87 66  Wt. (Lbs) 136    BMI 23.34 kg/m2      Physical Exam Vitals reviewed.  Constitutional:      Appearance: Normal appearance. She is normal weight.  Cardiovascular:     Rate and Rhythm: Normal rate and regular rhythm.     Heart sounds: Normal heart sounds.  Pulmonary:     Effort: Pulmonary effort is normal.     Breath sounds: Normal breath sounds.  Abdominal:     General: Abdomen is flat. Bowel  sounds are normal.     Palpations: Abdomen is soft.     Tenderness: There is abdominal tenderness (epigastric).  Neurological:     Mental Status: She is alert and oriented to person, place, and time.  Psychiatric:        Mood and Affect: Mood normal.        Behavior: Behavior normal.     Diabetic Foot Exam - Simple   No data filed      Lab Results  Component Value Date   WBC 6.9 03/02/2022   HGB 12.7 03/02/2022   HCT 37.3 03/02/2022   PLT 319 03/02/2022   GLUCOSE 89 03/02/2022   CHOL 184 02/13/2022   TRIG 79 02/13/2022   HDL 74 02/13/2022   LDLCALC 96 02/13/2022   ALT 10 02/13/2022   AST 17 02/13/2022   NA 138 03/02/2022   K 5.6 (H) 03/02/2022   CL 104 03/02/2022   CREATININE 0.87 03/02/2022   BUN 13 03/02/2022   CO2 27 03/02/2022   TSH 2.610 02/13/2022   INR 1.0 03/02/2022      Assessment & Plan:   Problem List Items Addressed This Visit       Cardiovascular and Mediastinum   Superior mesenteric artery stenosis (HCC)    Stented. Continue plavix, aspirin, and crestor.        Digestive   Esophageal dysphagia - Primary    Call GI and move UGI sooner.  Continue omeprazole 40 mg daily until prevacid approved. .  Recommend change omeprazole to prevacid due to plavix.        Other   Mixed hyperlipidemia    Well controlled.  No changes to medicines. Continue rosuvastatin 20 mg before bed. Continue to work on eating a healthy diet and exercise.        Other chest pain    EKG: NSR. No ST changes.  Order troponin I Stat.       Relevant Orders   EKG 12-Lead (Completed)   CBC with Differential/Platelet   Comprehensive metabolic panel   Troponin I  .  No orders of the defined types were placed in this encounter.   Orders Placed This Encounter  Procedures   CBC with Differential/Platelet   Comprehensive metabolic panel   Troponin I   EKG 12-Lead     Follow-up: Return in about 2 months (around 05/18/2022) for chronic fasting.  An After  Visit Summary was printed and given to the patient.  I,Lauren M Auman,acting as a scribe for Rochel Brome, MD.,have documented all relevant documentation on the behalf of Rochel Brome, MD,as directed by  Rochel Brome, MD while in the presence of Rochel Brome, MD.   Rochel Brome, MD [place] [phone]

## 2022-03-13 NOTE — Assessment & Plan Note (Signed)
Well controlled.  No changes to medicines. Continue rosuvastatin 20 mg before bed. Continue to work on eating a healthy diet and exercise.

## 2022-03-16 ENCOUNTER — Encounter: Payer: Self-pay | Admitting: Cardiology

## 2022-03-16 ENCOUNTER — Ambulatory Visit (INDEPENDENT_AMBULATORY_CARE_PROVIDER_SITE_OTHER): Payer: Medicare HMO | Admitting: Cardiology

## 2022-03-16 VITALS — BP 148/86 | HR 83 | Ht 64.0 in | Wt 137.2 lb

## 2022-03-16 DIAGNOSIS — K551 Chronic vascular disorders of intestine: Secondary | ICD-10-CM

## 2022-03-16 DIAGNOSIS — E782 Mixed hyperlipidemia: Secondary | ICD-10-CM

## 2022-03-16 DIAGNOSIS — I259 Chronic ischemic heart disease, unspecified: Secondary | ICD-10-CM

## 2022-03-16 DIAGNOSIS — Z87891 Personal history of nicotine dependence: Secondary | ICD-10-CM

## 2022-03-16 DIAGNOSIS — I209 Angina pectoris, unspecified: Secondary | ICD-10-CM | POA: Insufficient documentation

## 2022-03-16 DIAGNOSIS — G35 Multiple sclerosis: Secondary | ICD-10-CM

## 2022-03-16 HISTORY — DX: Angina pectoris, unspecified: I20.9

## 2022-03-16 HISTORY — DX: Personal history of nicotine dependence: Z87.891

## 2022-03-16 MED ORDER — NITROGLYCERIN 0.4 MG SL SUBL: 0.4000 mg | SUBLINGUAL_TABLET | SUBLINGUAL | 6 refills | Status: DC | PRN

## 2022-03-16 MED ORDER — METOPROLOL TARTRATE 100 MG PO TABS: 100.0000 mg | ORAL_TABLET | Freq: Once | ORAL | 0 refills | Status: DC

## 2022-03-16 NOTE — Progress Notes (Signed)
Cardiology Office Note:    Date:  03/16/2022   ID:  BHAVANA Cantrell, DOB 12-08-1960, MRN [id number]  PCP:  Rochel Brome, MD  Cardiologist:  Jenean Lindau, MD   Referring MD: Rochel Brome, MD    ASSESSMENT:    1. Superior mesenteric artery stenosis ([place])   2. Mixed hyperlipidemia   3. Angina pectoris ([place])   4. Multiple sclerosis ([place])   5. Ex-smoker    PLAN:    In order of problems listed above:  Angina pectoris: Patient's symptoms are very concerning and in view of this I discussed evaluation with patient invasive and noninvasive.  She prefers CT coronary angiography with FFR and benefits and potential is explained and she vocalized understanding and questions were answered to her satisfaction.  She will continue her antiplatelet therapy.  Sublingual nitroglycerin prescription was sent, its protocol and 911 protocol explained and the patient vocalized understanding questions were answered to the patient's satisfaction Essential hypertension: Blood pressure stable and diet was emphasized.  Lifestyle modification was urged.  She was advised to walk on a regular basis after the above evaluation was completed. Mixed dyslipidemia: She promises to do better with diet and lifestyle modification.  She will have blood work checked in 6 weeks and then we will decide about dosage of statins with the need to increase time. Former smoker: Promises never to go back. Patient will be seen in follow-up appointment in 6 months or earlier if the patient has any concerns    Medication Adjustments/Labs and Tests Ordered: Current medicines are reviewed at length with the patient today.  Concerns regarding medicines are outlined above.  No orders of the defined types were placed in this encounter.  No orders of the defined types were placed in this encounter.    History of Present Illness:    Christina Cantrell is a 61 y.o. female who is being seen today for the evaluation of chest pain at the  request of Cox, Kirsten, MD. patient is a pleasant 61 year old female.  She has history of multiple sclerosis, hyperlipidemia.  She recently underwent stenting of the superior mesenteric artery for pain in the abdomen after consumption of food.  Subsequently she is done well.  Now she mentions to me that when she exerts herself she has tightness in the chest.  No radiation to the neck or to the arms.  She has a significant smoking history and has quit smoking.  At the time of my evaluation, the patient is alert awake oriented and in no distress.  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   Centrilobular emphysema ([place]) 10/04/2021   Cervical radiculopathy 07/21/2021   Chronic low back pain 06/04/2013   Degeneration of lumbar intervertebral disc 06/04/2013   Depression 09/16/2020   Epigastric pain 10/04/2021   Esophageal dysphagia 02/13/2022   Fatigue 09/16/2020   Flank pain 10/04/2021   Gastroesophageal reflux disease 02/13/2022   Left lower quadrant abdominal pain 12/29/2021   Leg edema, left 12/29/2021   Localized osteoporosis without current pathological fracture 06/27/2021   Mixed hyperlipidemia 06/27/2021   Multiple sclerosis (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   Therapeutic opioid-induced constipation ([facility]) 0/25/4270   Uncomplicated opioid dependence ([place]) 09/12/2021   Vitamin D deficiency 06/27/2021    Past Surgical History:  Procedure Laterality Date   BREAST BIOPSY Bilateral 08/24/2020   BREAST EXCISIONAL BIOPSY Right 1975   IR ANGIOGRAM VISCERAL SELECTIVE  03/02/2022   IR  RADIOLOGIST EVAL & MGMT  02/21/2022   IR TRANSCATH PLC STENT 1ST ART NOT LE CV CAR VERT CAR  03/02/2022   IR US GUIDE VASC ACCESS RIGHT  03/02/2022    Current Medications: Current Meds  Medication Sig   albuterol (VENTOLIN HFA) 108 (90 Base) MCG/ACT inhaler Inhale 2 puffs into the lungs every 6 (six) hours as needed for wheezing or shortness of breath.   aspirin EC 81 MG tablet Take 81 mg by mouth daily. Swallow whole.   citalopram (CELEXA) 40 MG tablet Take 40 mg by mouth daily.   clopidogrel (PLAVIX) 75 MG tablet Take 1 tablet (75 mg total) by mouth daily.   cyanocobalamin (,VITAMIN B-12,) 1000 MCG/ML injection INJECT 1 ML (1,000 MCG TOTAL) INTO THE MUSCLE ONCE A WEEK.   cyclobenzaprine (FLEXERIL) 10 MG tablet Take 10 mg by mouth 3 (three) times daily as needed for muscle spasms.   doxycycline (PERIOSTAT) 20 MG tablet Take 20 mg by mouth 2 (two) times daily.   fentaNYL (DURAGESIC) 75 MCG/HR Place 1 patch onto the skin 2 days.   ketoconazole (NIZORAL) 2 % shampoo Apply 1 Application topically daily as needed for irritation.   Loteprednol Etabonate 0.5 % GEL Place 1 drop into both eyes daily as needed (optic neuritis).   metroNIDAZOLE (METROCREAM) 0.75 % cream Apply 1 Application topically 2 (two) times daily.   modafinil (PROVIGIL) 100 MG tablet Take 200 mg by mouth daily as needed (narcolepsy).   norethindrone-ethinyl estradiol (FYAVOLV) 1-5 MG-MCG TABS tablet Take 1 tablet by mouth daily.   omeprazole (PRILOSEC) 40 MG capsule Take 1 capsule (40 mg total) by mouth daily.   oxyCODONE-acetaminophen (PERCOCET) 10-325 MG tablet Take 1 tablet by mouth every 4 (four) hours as needed for pain.   Polyethyl Glycol-Propyl Glycol (SYSTANE OP) Place 1 drop into both eyes daily as needed (dry eyes).   rosuvastatin (CRESTOR) 20 MG tablet Take 30 mg by mouth at bedtime.   Vitamin D, Ergocalciferol, (DRISDOL) 1.25 MG (50000 UNIT) CAPS capsule Take 1 capsule (50,000 Units total) by mouth 2 (two) times a week.      Allergies:   Sertraline hcl   Social History   Socioeconomic History   Marital status: Married    Spouse name: Not on file   Number of children: Not on file   Years of education: Not on file   Highest education level: Not on file  Occupational History   Not on file  Tobacco Use   Smoking status: Former    Years: 20.00    Types: Cigarettes    Quit date: 2018    Years since quitting: 5.5   Smokeless tobacco: Never  Substance and Sexual Activity   Alcohol use: Never   Drug use: Never   Sexual activity: Not on file  Other Topics Concern   Not on file  Social History Narrative   Not on file   Social Determinants of Health   Financial Resource Strain: Not on file  Food Insecurity: Not on file  Transportation Needs: Not on file  Physical Activity: Not on file  Stress: Not on file  Social Connections: Not on file     Family History: The patient's family history is not on file.  ROS:   Please see the history of present illness.    All other systems reviewed and are negative.  EKGs/Labs/Other Studies Reviewed:    The following studies were reviewed today: EKG reveals sinus rhythm and nonspecific ST-T changes   Recent Labs: 02/13/2022: ALT 10; TSH 2.610 03/02/2022: BUN 13; Creatinine, Ser 0.87; Hemoglobin 12.7; Platelets 319; Potassium 5.6; Sodium 138  Recent Lipid Panel    Component Value Date/Time   CHOL 184 02/13/2022 1159   TRIG 79 02/13/2022 1159   HDL 74 02/13/2022 1159   CHOLHDL 2.5 02/13/2022 1159   LDLCALC 96 02/13/2022 1159    Physical Exam:    VS:  BP (!) 148/86   Pulse 83   Ht '5\' 4"'$  (1.626 m)   Wt 137 lb 3.2 oz (62.2 kg)   SpO2 97%   BMI 23.55 kg/m     Wt Readings from Last 3 Encounters:  03/16/22 137 lb 3.2 oz (62.2 kg)  03/13/22 136 lb (61.7 kg)  03/02/22 135 lb (61.2 kg)     GEN: Patient is in no acute distress HEENT: Normal NECK: No JVD; No carotid bruits LYMPHATICS: No lymphadenopathy CARDIAC: S1 S2 regular, 2/6 systolic  murmur at the apex. RESPIRATORY:  Clear to auscultation without rales, wheezing or rhonchi  ABDOMEN: Soft, non-tender, non-distended MUSCULOSKELETAL:  No edema; No deformity  SKIN: Warm and dry NEUROLOGIC:  Alert and oriented x 3 PSYCHIATRIC:  Normal affect    Signed, Jenean Lindau, MD  03/16/2022 4:39 PM    [facility]

## 2022-03-16 NOTE — Patient Instructions (Addendum)
Medication Instructions:  Your physician has recommended you make the following change in your medication:   Use nitroglycerin 1 tablet placed under the tongue at the first sign of chest pain or an angina attack. 1 tablet may be used every 5 minutes as needed, for up to 15 minutes. Do not take more than 3 tablets in 15 minutes. If pain persist call 911 or go to the nearest ED.   *If you need a refill on your cardiac medications before your next appointment, please call your pharmacy*   Lab Work: Your physician recommends that you return for lab work in: 6 weeks You need to have labs done when you are fasting.  You can come Monday through Friday 8:30 am to 12:00 pm and 1:15 to 4:30. You do not need to make an appointment as the order has already been placed. The labs you are going to have done are BMET, CBC, TSH, LFT and Lipids.   If you have labs (blood work) drawn today and your tests are completely normal, you will receive your results only by: [place] (if you have MyChart) OR A paper copy in the mail If you have any lab test that is abnormal or we need to change your treatment, we will call you to review the results.   Testing/Procedures:   Your cardiac CT will be scheduled at one of the below locations:   [facility] [address] [place], [place] [address] [phone]   At [facility], please arrive at the [facility] and Children's Entrance (Entrance C2) of [facility] 30 minutes prior to test start time. You can use the FREE valet parking offered at entrance C (encouraged to control the heart rate for the test)  Proceed to the [facility] Radiology Department (first floor) to check-in and test prep.  All radiology patients and guests should use entrance C2 at [facility], accessed from [facility], even though the hospital's physical address listed is [address]      Please follow these instructions  carefully (unless otherwise directed):   On the Night Before the Test: Be sure to Drink plenty of water. Do not consume any caffeinated/decaffeinated beverages or chocolate 12 hours prior to your test. Do not take any antihistamines 12 hours prior to your test.   On the Day of the Test: Drink plenty of water until 1 hour prior to the test. Do not eat any food 4 hours prior to the test. You may take your regular medications prior to the test.  Take metoprolol (Lopressor) two hours prior to test. This is a 1 time dose. FEMALES- please wear underwire-free bra if available, avoid dresses & tight clothing  After the Test: Drink plenty of water. After receiving IV contrast, you may experience a mild flushed feeling. This is normal. On occasion, you may experience a mild rash up to 24 hours after the test. This is not dangerous. If this occurs, you can take Benadryl 25 mg and increase your fluid intake. If you experience trouble breathing, this can be serious. If it is severe call 911 IMMEDIATELY. If it is mild, please call our office. If you take any of these medications: Glipizide/Metformin, Avandament, Glucavance, please do not take 48 hours after completing test unless otherwise instructed.  We will call to schedule your test 2-4 weeks out understanding that some insurance companies will need an authorization prior to the service being performed.   For non-scheduling related  questions, please contact the cardiac imaging nurse navigator should you have any questions/concerns: Marchia Bond, Cardiac Imaging Nurse Navigator Gordy Clement, Cardiac Imaging Nurse Navigator [facility] Direct Office Dial: [phone]   For scheduling needs, including cancellations and rescheduling, please call [place], [phone].    Your physician has requested that you have an echocardiogram. Echocardiography is a painless test that uses sound waves to create images of your  heart. It provides your doctor with information about the size and shape of your heart and how well your heart's chambers and valves are working. This procedure takes approximately one hour. There are no restrictions for this procedure.   Follow-Up: At [facility], you and your health needs are our priority.  As part of our continuing mission to provide you with exceptional heart care, we have created designated Provider Care Teams.  These Care Teams include your primary Cardiologist (physician) and Advanced Practice Providers (APPs -  Physician Assistants and Nurse Practitioners) who all work together to provide you with the care you need, when you need it.  We recommend signing up for the patient portal called "MyChart".  Sign up information is provided on this After Visit Summary.  MyChart is used to connect with patients for Virtual Visits (Telemedicine).  Patients are able to view lab/test results, encounter notes, upcoming appointments, etc.  Non-urgent messages can be sent to your provider as well.   To learn more about what you can do with MyChart, go to [email or web address].    Your next appointment:   9 month(s)  The format for your next appointment:   In Person  Provider:   Jyl Heinz, MD   Other Instructions Cardiac CT Angiogram A cardiac CT angiogram is a procedure to look at the heart and the area around the heart. It may be done to help find the cause of chest pains or other symptoms of heart disease. During this procedure, a substance called contrast dye is injected into the blood vessels in the area to be checked. A large X-ray machine, called a CT scanner, then takes detailed pictures of the heart and the surrounding area. The procedure is also sometimes called a coronary CT angiogram, coronary artery scanning, or CTA. A cardiac CT angiogram allows the health care provider to see how well blood is flowing to and from the heart. The health care provider will be able  to see if there are any problems, such as: Blockage or narrowing of the coronary arteries in the heart. Fluid around the heart. Signs of weakness or disease in the muscles, valves, and tissues of the heart. Tell a health care provider about: Any allergies you have. This is especially important if you have had a previous allergic reaction to contrast dye. All medicines you are taking, including vitamins, herbs, eye drops, creams, and over-the-counter medicines. Any blood disorders you have. Any surgeries you have had. Any medical conditions you have. Whether you are pregnant or may be pregnant. Any anxiety disorders, chronic pain, or other conditions you have that may increase your stress or prevent you from lying still. What are the risks? Generally, this is a safe procedure. However, problems may occur, including: Bleeding. Infection. Allergic reactions to medicines or dyes. Damage to other structures or organs. Kidney damage from the contrast dye that is used. Increased risk of cancer from radiation exposure. This risk is low. Talk with your health care provider about: The risks and benefits of testing. How you can receive the lowest  dose of radiation. What happens before the procedure? Wear comfortable clothing and remove any jewelry, glasses, dentures, and hearing aids. Follow instructions from your health care provider about eating and drinking. This may include: For 12 hours before the procedure -- avoid caffeine. This includes tea, coffee, soda, energy drinks, and diet pills. Drink plenty of water or other fluids that do not have caffeine in them. Being well hydrated can prevent complications. For 4-6 hours before the procedure -- stop eating and drinking. The contrast dye can cause nausea, but this is less likely if your stomach is empty. Ask your health care provider about changing or stopping your regular medicines. This is especially important if you are taking diabetes  medicines, blood thinners, or medicines to treat problems with erections (erectile dysfunction). What happens during the procedure?  Hair on your chest may need to be removed so that small sticky patches called electrodes can be placed on your chest. These will transmit information that helps to monitor your heart during the procedure. An IV will be inserted into one of your veins. You might be given a medicine to control your heart rate during the procedure. This will help to ensure that good images are obtained. You will be asked to lie on an exam table. This table will slide in and out of the CT machine during the procedure. Contrast dye will be injected into the IV. You might feel warm, or you may get a metallic taste in your mouth. You will be given a medicine called nitroglycerin. This will relax or dilate the arteries in your heart. The table that you are lying on will move into the CT machine tunnel for the scan. The person running the machine will give you instructions while the scans are being done. You may be asked to: Keep your arms above your head. Hold your breath. Stay very still, even if the table is moving. When the scanning is complete, you will be moved out of the machine. The IV will be removed. The procedure may vary among health care providers and hospitals. What can I expect after the procedure? After your procedure, it is common to have: A metallic taste in your mouth from the contrast dye. A feeling of warmth. A headache from the nitroglycerin. Follow these instructions at home: Take over-the-counter and prescription medicines only as told by your health care provider. If you are told, drink enough fluid to keep your urine pale yellow. This will help to flush the contrast dye out of your body. Most people can return to their normal activities right after the procedure. Ask your health care provider what activities are safe for you. It is up to you to get the results  of your procedure. Ask your health care provider, or the department that is doing the procedure, when your results will be ready. Keep all follow-up visits as told by your health care provider. This is important. Contact a health care provider if: You have any symptoms of allergy to the contrast dye. These include: Shortness of breath. Rash or hives. A racing heartbeat. Summary A cardiac CT angiogram is a procedure to look at the heart and the area around the heart. It may be done to help find the cause of chest pains or other symptoms of heart disease. During this procedure, a large X-ray machine, called a CT scanner, takes detailed pictures of the heart and the surrounding area after a contrast dye has been injected into blood vessels in the area.  Ask your health care provider about changing or stopping your regular medicines before the procedure. This is especially important if you are taking diabetes medicines, blood thinners, or medicines to treat erectile dysfunction. If you are told, drink enough fluid to keep your urine pale yellow. This will help to flush the contrast dye out of your body. This information is not intended to replace advice given to you by your health care provider. Make sure you discuss any questions you have with your health care provider. Document Revised: 04/16/2019 Document Reviewed: 04/16/2019 Elsevier Patient Education  [place].  Nitroglycerin Sublingual Tablets What is this medication? NITROGLYCERIN (nye troe GLI ser in) prevents and treats chest pain (angina). It works by relaxing blood vessels, which decreases the amount of work the heart has to do. It belongs to a group of medications called nitrates. This medicine may be used for other purposes; ask your health care provider or pharmacist if you have questions. COMMON BRAND NAME(S): Nitroquick, Nitrostat, Nitrotab What should I tell my care team before I take this medication? They need to know if  you have any of these conditions: Anemia Head injury, recent stroke, or bleeding in the brain Liver disease Previous heart attack An unusual or allergic reaction to nitroglycerin, other medications, foods, dyes, or preservatives Pregnant or trying to get pregnant Breast-feeding How should I use this medication? Take this medication by mouth as needed. Use at the first sign of an angina attack (chest pain or tightness). You can also take this medication 5 to 10 minutes before an event likely to produce chest pain. Follow the directions exactly as written on the prescription label. Place one tablet under your tongue and let it dissolve. Do not swallow whole. Replace the dose if you accidentally swallow it. It will help if your mouth is not dry. Saliva around the tablet will help it to dissolve more quickly. Do not eat or drink, smoke or chew tobacco while a tablet is dissolving. Sit down when taking this medication. In an angina attack, you should feel better within 5 minutes after your first dose. You can take a dose every 5 minutes up to a total of 3 doses. If you do not feel better or feel worse after 1 dose, call 9-1-1 at once. Do not take more than 3 doses in 15 minutes. Your care team might give you other directions. Follow those directions if they do. Do not take your medication more often than directed. Talk to your care team about the use of this medication in children. Special care may be needed. Overdosage: If you think you have taken too much of this medicine contact a poison control center or emergency room at once. NOTE: This medicine is only for you. Do not share this medicine with others. What if I miss a dose? This does not apply. This medication is only used as needed. What may interact with this medication? Do not take this medication with any of the following: Certain migraine medications like ergotamine and dihydroergotamine (DHE) Medications used to treat erectile dysfunction  like sildenafil, tadalafil, and vardenafil Ri[place]iguat This medication may also interact with the following: Alteplase Aspirin Heparin Medications for high blood pressure Medications for mental depression Other medications used to treat angina Phenothiazines like chlorpromazine, mesoridazine, prochlorperazine, thioridazine This list may not describe all possible interactions. Give your health care provider a list of all the medicines, herbs, non-prescription drugs, or dietary supplements you use. Also tell them if you smoke, drink alcohol,  or use illegal drugs. Some items may interact with your medicine. What should I watch for while using this medication? Tell your care team if you feel your medication is no longer working. Keep this medication with you at all times. Sit or lie down when you take your medication to prevent falling if you feel dizzy or faint after using it. Try to remain calm. This will help you to feel better faster. If you feel dizzy, take several deep breaths and lie down with your feet propped up, or bend forward with your head resting between your knees. You may get drowsy or dizzy. Do not drive, use machinery, or do anything that needs mental alertness until you know how this medication affects you. Do not stand or sit up quickly, especially if you are an older patient. This reduces the risk of dizzy or fainting spells. Alcohol can make you more drowsy and dizzy. Avoid alcoholic drinks. Do not treat yourself for coughs, colds, or pain while you are taking this medication without asking your care team for advice. Some ingredients may increase your blood pressure. What side effects may I notice from receiving this medication? Side effects that you should report to your care team as soon as possible: Allergic reactions--skin rash, itching, hives, swelling of the face, lips, tongue, or throat Headache, unusual weakness or fatigue, shortness of breath, nausea, vomiting, rapid  heartbeat, blue skin or lips, which may be signs of methemoglobinemia Increased pressure around the brain--severe headache, blurry vision, change in vision, nausea, vomiting Low blood pressure--dizziness, feeling faint or lightheaded, blurry vision Slow heartbeat--dizziness, feeling faint or lightheaded, confusion, trouble breathing, unusual weakness or fatigue Worsening chest pain (angina)--pain, pressure, or tightness in the chest, neck, back, or arms Side effects that usually do not require medical attention (report to your care team if they continue or are bothersome): Dizziness Flushing Headache This list may not describe all possible side effects. Call your doctor for medical advice about side effects. You may report side effects to FDA at 1-800-FDA-1088. Where should I keep my medication? Keep out of the reach of children. Store at room temperature between 20 and 25 degrees C (68 and 77 degrees F). Store in [employer]. Protect from light and moisture. Keep tightly closed. Throw away any unused medication after the expiration date. NOTE: This sheet is a summary. It may not cover all possible information. If you have questions about this medicine, talk to your doctor, pharmacist, or health care provider.  2023 Elsevier/Gold Standard (2020-11-30 00:00:00)  Echocardiogram An echocardiogram is a test that uses sound waves (ultrasound) to produce images of the heart. Images from an echocardiogram can provide important information about: Heart size and shape. The size and thickness and movement of your heart's walls. Heart muscle function and strength. Heart valve function or if you have stenosis. Stenosis is when the heart valves are too narrow. If blood is flowing backward through the heart valves (regurgitation). A tumor or infectious growth around the heart valves. Areas of heart muscle that are not working well because of poor blood flow or injury from a heart attack. Aneurysm  detection. An aneurysm is a weak or damaged part of an artery wall. The wall bulges out from the normal force of blood pumping through the body. Tell a health care provider about: Any allergies you have. All medicines you are taking, including vitamins, herbs, eye drops, creams, and over-the-counter medicines. Any blood disorders you have. Any surgeries you have had. Any medical conditions  you have. Whether you are pregnant or may be pregnant. What are the risks? Generally, this is a safe test. However, problems may occur, including an allergic reaction to dye (contrast) that may be used during the test. What happens before the test? No specific preparation is needed. You may eat and drink normally. What happens during the test?  You will take off your clothes from the waist up and put on a hospital gown. Electrodes or electrocardiogram (ECG)patches may be placed on your chest. The electrodes or patches are then connected to a device that monitors your heart rate and rhythm. You will lie down on a table for an ultrasound exam. A gel will be applied to your chest to help sound waves pass through your skin. A handheld device, called a transducer, will be pressed against your chest and moved over your heart. The transducer produces sound waves that travel to your heart and bounce back (or "echo" back) to the transducer. These sound waves will be captured in real-time and changed into images of your heart that can be viewed on a video monitor. The images will be recorded on a computer and reviewed by your health care provider. You may be asked to change positions or hold your breath for a short time. This makes it easier to get different views or better views of your heart. In some cases, you may receive contrast through an IV in one of your veins. This can improve the quality of the pictures from your heart. The procedure may vary among health care providers and hospitals. What can I expect after  the test? You may return to your normal, everyday life, including diet, activities, and medicines, unless your health care provider tells you not to do that. Follow these instructions at home: It is up to you to get the results of your test. Ask your health care provider, or the department that is doing the test, when your results will be ready. Keep all follow-up visits. This is important. Summary An echocardiogram is a test that uses sound waves (ultrasound) to produce images of the heart. Images from an echocardiogram can provide important information about the size and shape of your heart, heart muscle function, heart valve function, and other possible heart problems. You do not need to do anything to prepare before this test. You may eat and drink normally. After the echocardiogram is completed, you may return to your normal, everyday life, unless your health care provider tells you not to do that. This information is not intended to replace advice given to you by your health care provider. Make sure you discuss any questions you have with your health care provider. Document Revised: 05/04/2021 Document Reviewed: 04/13/2020 Elsevier Patient Education  [place].

## 2022-03-22 DIAGNOSIS — R1310 Dysphagia, unspecified: Secondary | ICD-10-CM | POA: Diagnosis not present

## 2022-03-22 DIAGNOSIS — K224 Dyskinesia of esophagus: Secondary | ICD-10-CM | POA: Diagnosis not present

## 2022-03-28 ENCOUNTER — Ambulatory Visit (INDEPENDENT_AMBULATORY_CARE_PROVIDER_SITE_OTHER): Payer: Medicare HMO

## 2022-03-28 DIAGNOSIS — K551 Chronic vascular disorders of intestine: Secondary | ICD-10-CM

## 2022-03-28 DIAGNOSIS — I209 Angina pectoris, unspecified: Secondary | ICD-10-CM | POA: Diagnosis not present

## 2022-03-28 LAB — ECHOCARDIOGRAM COMPLETE
Area-P 1/2: 3.27 cm2
S' Lateral: 3.2 cm

## 2022-04-02 ENCOUNTER — Other Ambulatory Visit: Payer: Self-pay | Admitting: Family Medicine

## 2022-04-03 ENCOUNTER — Ambulatory Visit
Admission: RE | Admit: 2022-04-03 | Discharge: 2022-04-03 | Disposition: A | Discharge status: Home / Self Care | Payer: Medicare HMO | Source: Ambulatory Visit | Attending: Student | Admitting: Student

## 2022-04-03 ENCOUNTER — Encounter: Payer: Self-pay | Admitting: *Deleted

## 2022-04-03 ENCOUNTER — Telehealth (HOSPITAL_COMMUNITY): Payer: Self-pay | Admitting: Emergency Medicine

## 2022-04-03 DIAGNOSIS — K55059 Acute (reversible) ischemia of intestine, part and extent unspecified: Secondary | ICD-10-CM | POA: Diagnosis not present

## 2022-04-03 DIAGNOSIS — K551 Chronic vascular disorders of intestine: Secondary | ICD-10-CM

## 2022-04-03 HISTORY — PX: IR RADIOLOGIST EVAL & MGMT: IMG5224

## 2022-04-03 NOTE — Progress Notes (Signed)
Chief Complaint: Patient was consulted remotely today (TeleHealth) for follow-up after SMA stent placement on 03/02/2022.  History of Present Illness: Christina Cantrell is a 61 y.o. 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  Following the procedure, symptoms of intestinal angina with meals has improved dramatically and Christina Cantrell is able to eat now without pain.  She has been constipated for several weeks and does not feel that over-the-counter MiraLAX is helping.  She feels bloated from constipation but denies any significant abdominal pain.  She has been taking aspirin and Plavix since the procedure.  She did have an upper GI series on 03/22/2022 for dysphagia and difficulty swallowing which demonstrated mild esophageal dysmotility, a mild distal esophageal stricture and normal gastric emptying.  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   Centrilobular emphysema ([place]) 10/04/2021   Cervical radiculopathy 07/21/2021   Chronic low back pain 06/04/2013   Degeneration of lumbar intervertebral disc 06/04/2013   Depression 09/16/2020   Epigastric pain 10/04/2021   Esophageal dysphagia 02/13/2022   Fatigue 09/16/2020   Flank pain 10/04/2021   Gastroesophageal reflux disease 02/13/2022   Left lower quadrant abdominal pain 12/29/2021   Leg edema, left 12/29/2021   Localized osteoporosis without current pathological fracture 06/27/2021   Mixed hyperlipidemia 06/27/2021   Multiple sclerosis ([place]) 1991   Age 66 yo   Neck pain 09/12/2021   Neuropathic pain 11/12/2017   Optic neuritis 09/17/2018   Osteoarthritis 09/17/2018   Osteopenia 11/20/2018   Other chest pain 02/18/2022   Pain in thoracic spine 06/04/2013   Paresthesia 02/13/2022   Polyuria 02/18/2022   Screening for lung cancer  12/29/2021   Spinal stenosis 09/17/2018   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 Authorizing Provider  albuterol (VENTOLIN HFA) 108 (90 Base) MCG/ACT inhaler Inhale 2 puffs into the lungs every 6 (six) hours as needed for wheezing or shortness of breath.    [provider]  aspirin EC 81 MG tablet Take 81 mg by mouth daily. Swallow whole.    [provider]  citalopram (CELEXA) 40 MG tablet Take 40 mg by mouth daily. 08/02/20   [provider]  clopidogrel (PLAVIX) 75 MG tablet Take 1 tablet (75 mg total) by mouth daily. 03/02/22   Docia Barrier, PA  cyanocobalamin (VITAMIN B12) 1000 MCG/ML injection INJECT 1 ML (1,000 MCG TOTAL) INTO THE MUSCLE ONCE A WEEK. 04/03/22   Cox, Kirsten, MD  cyclobenzaprine (FLEXERIL) 10 MG tablet Take 10 mg by mouth 3 (three) times daily as needed for muscle spasms. 07/31/20   [provider]  doxycycline (PERIOSTAT) 20 MG tablet Take 20 mg by mouth 2 (two) times  daily. 12/07/21   [provider]  fentaNYL (DURAGESIC) 75 MCG/HR Place 1 patch onto the skin 2 days.    [provider]  ketoconazole (NIZORAL) 2 % shampoo Apply 1 Application topically daily as needed for irritation.    [provider]  Loteprednol Etabonate 0.5 % GEL Place 1 drop into both eyes daily as needed  (optic neuritis).    [provider]  metoprolol tartrate (LOPRESSOR) 100 MG tablet Take 1 tablet (100 mg total) by mouth once for 1 dose. Take 2 hours prior to your CT if your heart rate is greater than 55 03/16/22 03/16/22  Revankar, Reita Cliche, MD  metroNIDAZOLE (METROCREAM) 0.75 % cream Apply 1 Application topically 2 (two) times daily. 09/07/21   [provider]  modafinil (PROVIGIL) 100 MG tablet Take 200 mg by mouth daily as needed (narcolepsy).    [provider]  nitroGLYCERIN (NITROSTAT) 0.4 MG SL tablet Place 1 tablet (0.4 mg total) under the tongue every 5 (five) minutes as needed. 03/16/22 06/14/22  Revankar, Reita Cliche, MD  norethindrone-ethinyl estradiol (FYAVOLV) 1-5 MG-MCG TABS tablet Take 1 tablet by mouth daily.    [provider]  omeprazole (PRILOSEC) 40 MG capsule Take 1 capsule (40 mg total) by mouth daily. 02/13/22   Cox, Elnita Maxwell, MD  oxyCODONE-acetaminophen (PERCOCET) 10-325 MG tablet Take 1 tablet by mouth every 4 (four) hours as needed for pain. 08/13/20   [provider]  Polyethyl Glycol-Propyl Glycol (SYSTANE OP) Place 1 drop into both eyes daily as needed (dry eyes).    [provider]  rosuvastatin (CRESTOR) 20 MG tablet Take 30 mg by mouth at bedtime.    [provider]  Vitamin D, Ergocalciferol, (DRISDOL) 1.25 MG (50000 UNIT) CAPS capsule Take 1 capsule (50,000 Units total) by mouth 2 (two) times a week. 02/16/22   Rochel Brome, MD     No family history on file.  Social History   Socioeconomic History   Marital status: Married    Spouse name: Not on file   Number of children: Not on file   Years of education: Not on file   Highest education level: Not on file  Occupational History   Not on file  Tobacco Use   Smoking status: Former    Years: 20.00    Types: Cigarettes    Quit date: 2018    Years since quitting: 5.5   Smokeless tobacco: Never  Substance and Sexual Activity   Alcohol use: Never   Drug  use: Never   Sexual activity: Not on file  Other Topics Concern   Not on file  Social History Narrative   Not on file   Social Determinants of Health   Financial Resource Strain: Not on file  Food Insecurity: Not on file  Transportation Needs: Not on file  Physical Activity: Not on file  Stress: Not on file  Social Connections: Not on file     Review of Systems  Constitutional: Negative.   Respiratory: Negative.    Cardiovascular: Negative.   Gastrointestinal:  Positive for constipation. Negative for abdominal pain, blood in stool, diarrhea, nausea and vomiting.  Genitourinary: Negative.   Musculoskeletal: Negative.   Neurological: Negative.     Review of Systems: A 12 point ROS discussed and pertinent positives are indicated in the HPI above.  All other systems are negative.   Physical Exam No direct physical exam was performed (except for noted visual exam findings with Video Visits).   Vital Signs: There  were no vitals taken for this visit.  Imaging: ECHOCARDIOGRAM COMPLETE  Result Date: 03/28/2022    ECHOCARDIOGRAM REPORT   Patient Name:   ROBY SPALLA Date of Exam: 03/28/2022 Medical Rec #:  [id number]        Height:       64.0 in Accession #:    [id number]       Weight:       137.2 lb Date of Birth:  11/08/1960        BSA:          1.667 m Patient Age:    69 years         BP:           148/86 mmHg Patient Gender: F                HR:           95 bpm. Exam Location:  [place] Procedure: 2D Echo, Cardiac Doppler, Color Doppler and Strain Analysis Indications:    Superior mesenteric artery stenosis (HCC) [K55.1 (ICD-10-CM)];                 Angina pectoris ([place]) [I20.9 (ICD-10-CM)]  History:        Patient has no prior history of Echocardiogram examinations.                 Signs/Symptoms:Chest Pain.  Sonographer:    Philipp Deputy RDCS Referring Phys: Waverly Ferrari [facility]  Sonographer Comments: Image acquisition challenging due to respiratory motion. IMPRESSIONS  1.  Left ventricular ejection fraction, by estimation, is 60 to 65%. The left ventricle has normal function. The left ventricle has no regional wall motion abnormalities. Left ventricular diastolic parameters were normal.  2. Right ventricular systolic function is normal. The right ventricular size is normal.  3. The mitral valve is normal in structure. Mild mitral valve regurgitation. No evidence of mitral stenosis.  4. The aortic valve is normal in structure. Aortic valve regurgitation is not visualized. No aortic stenosis is present.  5. The inferior vena cava is normal in size with greater than 50% respiratory variability, suggesting right atrial pressure of 3 mmHg. FINDINGS  Left Ventricle: Left ventricular ejection fraction, by estimation, is 60 to 65%. The left ventricle has normal function. The left ventricle has no regional wall motion abnormalities. The left ventricular internal cavity size was normal in size. There is  no left ventricular hypertrophy. Left ventricular diastolic parameters were normal. Right Ventricle: The right ventricular size is normal. No increase in right ventricular wall thickness. Right ventricular systolic function is normal. Left Atrium: Left atrial size was normal in size. Right Atrium: Right atrial size was normal in size. Pericardium: There is no evidence of pericardial effusion. Mitral Valve: The mitral valve is normal in structure. Mild mitral valve regurgitation. No evidence of mitral valve stenosis. Tricuspid Valve: The tricuspid valve is normal in structure. Tricuspid valve regurgitation is not demonstrated. No evidence of tricuspid stenosis. Aortic Valve: The aortic valve is normal in structure. Aortic valve regurgitation is not visualized. No aortic stenosis is present. Pulmonic Valve: The pulmonic valve was normal in structure. Pulmonic valve regurgitation is not visualized. No evidence of pulmonic stenosis. Aorta: The aortic root is normal in size and structure. Venous:  The inferior vena cava is normal in size with greater than 50% respiratory variability, suggesting right atrial pressure of 3 mmHg. IAS/Shunts: No atrial level shunt detected by color flow Doppler.  LEFT VENTRICLE PLAX 2D LVIDd:  3.80 cm Diastology LVIDs:         3.20 cm LV e' medial:    7.07 cm/s LV PW:         0.80 cm LV E/e' medial:  8.3 LV IVS:        1.10 cm LV e' lateral:   8.16 cm/s                        LV E/e' lateral: 7.2  RIGHT VENTRICLE             IVC RV Basal diam:  2.80 cm     IVC diam: 1.00 cm RV S prime:     16.40 cm/s TAPSE (M-mode): 2.3 cm LEFT ATRIUM             Index        RIGHT ATRIUM          Index LA diam:        1.40 cm 0.84 cm/m   RA Area:     9.44 cm LA Vol (A2C):   21.1 ml 12.66 ml/m  RA Volume:   20.30 ml 12.18 ml/m LA Vol (A4C):   13.0 ml 7.80 ml/m LA Biplane Vol: 18.2 ml 10.92 ml/m  AORTIC VALVE LVOT Vmax:   116.00 cm/s LVOT Vmean:  82.200 cm/s LVOT VTI:    0.255 m  AORTA Ao Root diam: 3.30 cm Ao Asc diam:  2.60 cm Ao Desc diam: 1.50 cm MITRAL VALVE MV Area (PHT): 3.27 cm    SHUNTS MV Decel Time: 232 msec    Systemic VTI: 0.26 m MV E velocity: 58.70 cm/s MV A velocity: 62.10 cm/s MV E/A ratio:  0.95 Jenne Campus MD Electronically signed by Jenne Campus MD Signature Date/Time: 03/28/2022/2:27:03 PM    Final     Labs:  CBC: Recent Labs    06/27/21 0758 10/04/21 1128 02/13/22 1159 03/02/22 0752  WBC 6.9 7.9 6.2 6.9  HGB 14.1 14.5 13.5 12.7  HCT 41.6 43.6 41.5 37.3  PLT 334 328 336 319    COAGS: Recent Labs    03/02/22 0820  INR 1.0    BMP: Recent Labs    06/27/21 0758 10/04/21 1128 02/13/22 1159 03/02/22 0752  NA 142 142 139 138  K 4.3 4.0 4.2 5.6*  CL 104 103 99 104  CO2 '23 24 23 27  '$ GLUCOSE 78 75 78 89  BUN '8 6 10 13  '$ CALCIUM 9.3 9.7 9.6 8.9  CREATININE 0.81 0.90 0.95 0.87  GFRNONAA  --   --   --  >60    LIVER FUNCTION TESTS: Recent Labs    06/27/21 0758 10/04/21 1128 02/13/22 1159  BILITOT 0.4 0.5 0.6  AST '14 20 17   '$ ALT '7 22 10  '$ ALKPHOS 50 62 60  PROT 6.5 6.6 6.8  ALBUMIN 4.5 4.4 4.6    Assessment and Plan:  Christina Cantrell is doing well after stenting of a symptomatic critical proximal SMA stenosis.  She currently has significant constipation which is not responding to over-the-counter MiraLAX and I recommended consulting her primary care physician or gastroenterologist.  Her symptoms of intestinal angina after meals have resolved after stenting.  I have recommended a mesenteric duplex ultrasound to establish baseline velocities for follow-up.  This can be performed 3 months after the procedure in September.  I have recommended that she stay on aspirin indefinitely and Plavix for 6 months.  I will follow-up with her after the  mesenteric duplex ultrasound in September.   Electronically Signed: Azzie Roup 04/03/2022, 10:11 AM    I spent a total of 10 Minutes in remote  clinical consultation, greater than 50% of which was counseling/coordinating care post intravascular stent placement to treat SMA stenosis.    Visit type: Audio only (telephone). Audio (no video) only due to patient's lack of internet/smartphone capability. Alternative for in-person consultation at [facility], [address], [place], [place]. This visit type was conducted due to national recommendations for restrictions regarding the COVID-19 Pandemic (e.g. social distancing).  This format is felt to be most appropriate for this patient at this time.  All issues noted in this document were discussed and addressed.

## 2022-04-03 NOTE — Telephone Encounter (Signed)
Reaching out to patient to offer assistance regarding upcoming cardiac imaging study; pt verbalizes understanding of appt date/time, parking situation and where to check in, pre-test NPO status and medications ordered, and verified current allergies; name and call back number provided for further questions should they arise Marchia Bond RN Navigator Cardiac Imaging [facility] [phone] office [phone] cell  Denies iv issues Hx of MS Aware of nitro '100mg'$  metoprolol tartrate Arrival [address] entrance

## 2022-04-04 ENCOUNTER — Encounter: Payer: Self-pay | Admitting: Family Medicine

## 2022-04-04 ENCOUNTER — Ambulatory Visit (HOSPITAL_COMMUNITY)
Admission: RE | Admit: 2022-04-04 | Discharge: 2022-04-04 | Disposition: A | Discharge status: Home / Self Care | Payer: Medicare HMO | Source: Ambulatory Visit | Attending: Cardiology | Admitting: Cardiology

## 2022-04-04 DIAGNOSIS — I209 Angina pectoris, unspecified: Secondary | ICD-10-CM | POA: Insufficient documentation

## 2022-04-04 DIAGNOSIS — I259 Chronic ischemic heart disease, unspecified: Secondary | ICD-10-CM | POA: Insufficient documentation

## 2022-04-04 MED ORDER — IOHEXOL 350 MG/ML SOLN
100.0000 mL | Freq: Once | INTRAVENOUS | Status: AC | PRN
  Administered 2022-04-04: 100 mL via INTRAVENOUS

## 2022-04-04 MED ORDER — NITROGLYCERIN 0.4 MG SL SUBL
0.8000 mg | SUBLINGUAL_TABLET | Freq: Once | SUBLINGUAL | Status: AC
  Administered 2022-04-04: 0.8 mg via SUBLINGUAL

## 2022-04-04 MED ORDER — NITROGLYCERIN 0.4 MG SL SUBL
SUBLINGUAL_TABLET | SUBLINGUAL | Status: AC
  Filled 2022-04-04: qty 2

## 2022-04-23 ENCOUNTER — Other Ambulatory Visit: Payer: Self-pay | Admitting: Family Medicine

## 2022-04-28 ENCOUNTER — Other Ambulatory Visit: Payer: Self-pay | Admitting: Interventional Radiology

## 2022-04-28 DIAGNOSIS — K551 Chronic vascular disorders of intestine: Secondary | ICD-10-CM

## 2022-05-02 DIAGNOSIS — M5412 Radiculopathy, cervical region: Secondary | ICD-10-CM | POA: Diagnosis not present

## 2022-05-02 DIAGNOSIS — M542 Cervicalgia: Secondary | ICD-10-CM | POA: Diagnosis not present

## 2022-05-02 DIAGNOSIS — M5136 Other intervertebral disc degeneration, lumbar region: Secondary | ICD-10-CM | POA: Diagnosis not present

## 2022-05-02 DIAGNOSIS — G35 Multiple sclerosis: Secondary | ICD-10-CM | POA: Diagnosis not present

## 2022-05-04 ENCOUNTER — Other Ambulatory Visit: Payer: Self-pay | Admitting: Family Medicine

## 2022-05-10 ENCOUNTER — Other Ambulatory Visit: Payer: Self-pay | Admitting: Interventional Radiology

## 2022-05-10 DIAGNOSIS — K551 Chronic vascular disorders of intestine: Secondary | ICD-10-CM

## 2022-05-15 ENCOUNTER — Other Ambulatory Visit: Payer: Self-pay | Admitting: Family Medicine

## 2022-05-15 DIAGNOSIS — K219 Gastro-esophageal reflux disease without esophagitis: Secondary | ICD-10-CM

## 2022-05-17 NOTE — Progress Notes (Signed)
Subjective:  Patient ID: Christina Cantrell, female    DOB: 01-20-1961  Age: 61 y.o. MRN: [id number]  Chief Complaint  Patient presents with   Hyperlipidemia   Multiple sclerosis    HPI:  Patient is a 61 year old white female with multiple sclerosis, hyperlipidemia, depression who presents for chronic follow-up.  Patient has seen cardiology and interventional radiology. Patient's SMA was stented. Her abdominal pain has resolved. ECHO normal.  CTA:  IMPRESSION: 1. Coronary calcium score of 89.1. This was 24 percentile for age and sex matched control. 2. Normal coronary origin with right dominance. 3. CAD-RADS 2. Mild non-obstructive CAD (25-49%) (worst stenosis - RCA 25-49%). Consider non-atherosclerotic causes of chest pain. Consider preventive therapy and risk factor modification. Lung CT scan also showed mild emphysema.  No lung cancer.    Hyperlipidemia: Rosuvastatin 40 mg daily was increased. Patient is also on aspirin 81 mg daily and plavix 75 mg daily.  Patient was recommended to take b12 shots weekly. She has taken 3 shots.  Depression: She is taking Celexa 40 mg daily.  MS: advanced. Not on any current medications for MS. Does not see neurology.   Opioid dependence: patient sees pain clinic. On percocet 10/325 mg q4 hours as needed. Duragesic patches 75 mcg once every 48 hours. On flexeril 10 mg three times a day as needed.    Current Outpatient Medications on File Prior to Visit  Medication Sig Dispense Refill   albuterol (VENTOLIN HFA) 108 (90 Base) MCG/ACT inhaler Inhale 2 puffs into the lungs every 6 (six) hours as needed for wheezing or shortness of breath.     aspirin EC 81 MG tablet Take 81 mg by mouth daily. Swallow whole.     citalopram (CELEXA) 40 MG tablet Take 40 mg by mouth daily.     clopidogrel (PLAVIX) 75 MG tablet Take 1 tablet (75 mg total) by mouth daily. 90 tablet 1   cyanocobalamin (VITAMIN B12) 1000 MCG/ML injection Inject 1 mL (1,000 mcg total) into  the muscle every 30 (thirty) days. 3 mL 1   cyclobenzaprine (FLEXERIL) 10 MG tablet Take 10 mg by mouth 3 (three) times daily as needed for muscle spasms.     doxycycline (PERIOSTAT) 20 MG tablet Take 20 mg by mouth 2 (two) times daily.     fentaNYL (DURAGESIC) 75 MCG/HR Place 1 patch onto the skin 2 days.     ketoconazole (NIZORAL) 2 % shampoo Apply 1 Application topically daily as needed for irritation.     Loteprednol Etabonate 0.5 % GEL Place 1 drop into both eyes daily as needed (optic neuritis).     metroNIDAZOLE (METROCREAM) 0.75 % cream Apply 1 Application topically 2 (two) times daily.     modafinil (PROVIGIL) 100 MG tablet Take 200 mg by mouth daily as needed (narcolepsy).     nitroGLYCERIN (NITROSTAT) 0.4 MG SL tablet Place 1 tablet (0.4 mg total) under the tongue every 5 (five) minutes as needed. 25 tablet 6   norethindrone-ethinyl estradiol (FYAVOLV) 1-5 MG-MCG TABS tablet Take 1 tablet by mouth daily.     omeprazole (PRILOSEC) 40 MG capsule TAKE 1 CAPSULE (40 MG TOTAL) BY MOUTH DAILY. 90 capsule 1   oxyCODONE-acetaminophen (PERCOCET) 10-325 MG tablet Take 1 tablet by mouth every 4 (four) hours as needed for pain.     Polyethyl Glycol-Propyl Glycol (SYSTANE OP) Place 1 drop into both eyes daily as needed (dry eyes).     Vitamin D, Ergocalciferol, (DRISDOL) 1.25 MG (50000 UNIT) CAPS capsule TAKE  1 CAPSULE (50,000 UNITS TOTAL) BY MOUTH TWO TIMES A WEEK 12 capsule 0   No current facility-administered medications on file prior to visit.   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   Centrilobular emphysema ([place]) 10/04/2021   Cervical radiculopathy 07/21/2021   Chronic low back pain 06/04/2013   Degeneration of lumbar intervertebral disc 06/04/2013   Depression 09/16/2020   Epigastric pain 10/04/2021   Esophageal dysphagia 02/13/2022   Fatigue 09/16/2020   Flank pain 10/04/2021   Gastroesophageal reflux disease 02/13/2022   Left lower quadrant  abdominal pain 12/29/2021   Leg edema, left 12/29/2021   Localized osteoporosis without current pathological fracture 06/27/2021   Mixed hyperlipidemia 06/27/2021   Multiple sclerosis ([place]) 1991   Age 61 yo   Neck pain 09/12/2021   Neuropathic pain 11/12/2017   Optic neuritis 09/17/2018   Osteoarthritis 09/17/2018   Osteopenia 11/20/2018   Other chest pain 02/18/2022   Pain in thoracic spine 06/04/2013   Paresthesia 02/13/2022   Polyuria 02/18/2022   Screening for lung cancer 12/29/2021   Spinal stenosis 09/17/2018   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 No pertinent family history. Social History   Socioeconomic History   Marital status: Married    Spouse name: Not on file   Number of children: Not on file   Years of education: Not on file   Highest education level: Not on file  Occupational History   Not on file  Tobacco Use   Smoking status: Former    Years: 20.00    Types: Cigarettes    Quit date: 2018    Years since quitting: 5.7   Smokeless tobacco: Never  Substance and Sexual Activity   Alcohol use: Never   Drug use: Never   Sexual activity: Not on file  Other Topics Concern   Not on file  Social History Narrative   Not on file   Social Determinants of Health   Financial Resource Strain: Not on file  Food Insecurity: Not on file  Transportation Needs: Not on file  Physical Activity: Not on file   Stress: Not on file  Social Connections: Not on file    Review of Systems  Constitutional:  Positive for chills. Negative for appetite change, fatigue and fever.  HENT:  Negative for congestion, ear pain, sinus pressure and sore throat.   Respiratory:  Negative for cough, chest tightness, shortness of breath and wheezing.   Cardiovascular:  Positive for chest pain. Negative for palpitations.  Gastrointestinal:  Positive for abdominal pain and constipation. Negative for diarrhea, nausea and vomiting.  Genitourinary:  Negative for dysuria and hematuria.  Musculoskeletal:  Negative for arthralgias, back pain, joint swelling and myalgias.  Skin:  Negative for rash.  Neurological:  Negative for dizziness, weakness and headaches.  Psychiatric/Behavioral:  Negative for dysphoric mood. The patient is not nervous/anxious.  Objective:  BP 128/78   Pulse 88   Temp (!) 97.2 F (36.2 C)   Resp 16   Ht '5\' 4"'$  (1.626 m)   Wt 136 lb (61.7 kg)   BMI 23.34 kg/m      05/18/2022    7:54 AM 04/04/2022    2:38 PM 04/04/2022    1:59 PM  BP/Weight  Systolic BP 628 315 176  Diastolic BP 78 58 80  Wt. (Lbs) 136    BMI 23.34 kg/m2      Physical Exam Vitals reviewed.  Constitutional:      Appearance: Normal appearance. She is normal weight.  Cardiovascular:     Rate and Rhythm: Normal rate and regular rhythm.     Heart sounds: Normal heart sounds.  Pulmonary:     Effort: Pulmonary effort is normal.     Breath sounds: Normal breath sounds.  Abdominal:     General: Abdomen is flat. Bowel sounds are normal.     Palpations: Abdomen is soft.  Neurological:     Mental Status: She is alert and oriented to person, place, and time.  Psychiatric:        Mood and Affect: Mood normal.        Behavior: Behavior normal.     Diabetic Foot Exam - Simple   No data filed      Lab Results  Component Value Date   WBC 6.7 05/18/2022   HGB 14.5 05/18/2022   HCT 43.5 05/18/2022   PLT 282  05/18/2022   GLUCOSE 84 05/18/2022   CHOL 163 05/18/2022   TRIG 141 05/18/2022   HDL 59 05/18/2022   LDLCALC 80 05/18/2022   ALT 11 05/18/2022   AST 20 05/18/2022   NA 139 05/18/2022   K 3.8 05/18/2022   CL 99 05/18/2022   CREATININE 1.08 (H) 05/18/2022   BUN 8 05/18/2022   CO2 24 05/18/2022   TSH 2.610 02/13/2022   INR 1.0 03/02/2022      Assessment & Plan:   Problem List Items Addressed This Visit       Cardiovascular and Mediastinum   Superior mesenteric artery stenosis (HCC)    Stented. Follow up with cardiology and interventional radiology.         Respiratory   Centrilobular emphysema ([place]) - Primary    On no inhalers.  Found on ct scan.  Pt has stopped smoking.         Digestive   Esophageal dysphagia    Continue omeprazole.  Management per specialist.  Dr. Lyda Jester.       Gastroesophageal reflux disease    Continue omeprazole 40 mg daily       Therapeutic opioid-induced constipation (OIC)    Recommend otc miralax.         Nervous and Auditory   Multiple sclerosis ([place])    Advanced.         Musculoskeletal and Integument   Localized osteoporosis without current pathological fracture    prolia was denied. Recommended try alendronate.       Relevant Orders   VITAMIN D 25 Hydroxy (Vit-D Deficiency, Fractures) (Completed)   Telogen effluvium     Other   Mixed hyperlipidemia    Well controlled.  No changes to medicines.  Continue to work on eating a healthy diet and exercise.  Labs drawn today.        Relevant Orders   CBC with Differential/Platelet (Completed)   Comprehensive metabolic panel (Completed)   Lipid panel (  Completed)   Cardiovascular Risk Assessment (Completed)   Vitamin D deficiency    Check vitamin D level.      Relevant Orders   VITAMIN D 25 Hydroxy (Vit-D Deficiency, Fractures) (Completed)   Central sleep apnea syndrome    Continue cpap.       Paresthesia    Check labs.       Need for influenza  vaccination   Relevant Orders   Flu Vaccine MDCK QUAD PF (Completed)   Vitamin B12 deficiency    Check b12 level.      Relevant Orders   Vitamin B12 (Completed)   Methylmalonic acid, serum (Completed)  .  Meds ordered this encounter  Medications   cyanocobalamin (VITAMIN B12) injection 1,000 mcg    Orders Placed This Encounter  Procedures   Flu Vaccine MDCK QUAD PF   CBC with Differential/Platelet   Comprehensive metabolic panel   Vitamin L95   Lipid panel   VITAMIN D 25 Hydroxy (Vit-D Deficiency, Fractures)   Methylmalonic acid, serum   Cardiovascular Risk Assessment     Follow-up: Return in about 3 months (around 08/17/2022) for chronic fasting.  An After Visit Summary was printed and given to the patient.  Rochel Brome, MD [facility] [phone]

## 2022-05-18 ENCOUNTER — Encounter: Payer: Self-pay | Admitting: Family Medicine

## 2022-05-18 ENCOUNTER — Ambulatory Visit (INDEPENDENT_AMBULATORY_CARE_PROVIDER_SITE_OTHER): Payer: Medicare HMO | Admitting: Family Medicine

## 2022-05-18 VITALS — BP 128/78 | HR 88 | Temp 97.2°F | Resp 16 | Ht 64.0 in | Wt 136.0 lb

## 2022-05-18 DIAGNOSIS — E538 Deficiency of other specified B group vitamins: Secondary | ICD-10-CM | POA: Diagnosis not present

## 2022-05-18 DIAGNOSIS — Z23 Encounter for immunization: Secondary | ICD-10-CM | POA: Diagnosis not present

## 2022-05-18 DIAGNOSIS — R202 Paresthesia of skin: Secondary | ICD-10-CM | POA: Diagnosis not present

## 2022-05-18 DIAGNOSIS — E559 Vitamin D deficiency, unspecified: Secondary | ICD-10-CM

## 2022-05-18 DIAGNOSIS — K551 Chronic vascular disorders of intestine: Secondary | ICD-10-CM

## 2022-05-18 DIAGNOSIS — M816 Localized osteoporosis [Lequesne]: Secondary | ICD-10-CM

## 2022-05-18 DIAGNOSIS — G35 Multiple sclerosis: Secondary | ICD-10-CM | POA: Diagnosis not present

## 2022-05-18 DIAGNOSIS — G4731 Primary central sleep apnea: Secondary | ICD-10-CM

## 2022-05-18 DIAGNOSIS — E782 Mixed hyperlipidemia: Secondary | ICD-10-CM

## 2022-05-18 DIAGNOSIS — K5903 Drug induced constipation: Secondary | ICD-10-CM

## 2022-05-18 DIAGNOSIS — J432 Centrilobular emphysema: Secondary | ICD-10-CM

## 2022-05-18 DIAGNOSIS — K219 Gastro-esophageal reflux disease without esophagitis: Secondary | ICD-10-CM

## 2022-05-18 DIAGNOSIS — L650 Telogen effluvium: Secondary | ICD-10-CM

## 2022-05-18 DIAGNOSIS — T402X5A Adverse effect of other opioids, initial encounter: Secondary | ICD-10-CM

## 2022-05-18 DIAGNOSIS — R1319 Other dysphagia: Secondary | ICD-10-CM | POA: Diagnosis not present

## 2022-05-18 MED ORDER — CYANOCOBALAMIN 1000 MCG/ML IJ SOLN
1000.0000 ug | Freq: Once | INTRAMUSCULAR | Status: AC
  Administered 2022-05-18: 1000 ug via INTRAMUSCULAR

## 2022-05-22 LAB — COMPREHENSIVE METABOLIC PANEL
ALT: 11 IU/L (ref 0–32)
AST: 20 IU/L (ref 0–40)
Albumin/Globulin Ratio: 2 (ref 1.2–2.2)
Albumin: 4.4 g/dL (ref 3.8–4.9)
Alkaline Phosphatase: 65 IU/L (ref 44–121)
BUN/Creatinine Ratio: 7 — ABNORMAL LOW (ref 9–23)
BUN: 8 mg/dL (ref 6–24)
Bilirubin Total: 0.5 mg/dL (ref 0.0–1.2)
CO2: 24 mmol/L (ref 20–29)
Calcium: 9.7 mg/dL (ref 8.7–10.2)
Chloride: 99 mmol/L (ref 96–106)
Creatinine, Ser: 1.08 mg/dL — ABNORMAL HIGH (ref 0.57–1.00)
Globulin, Total: 2.2 g/dL (ref 1.5–4.5)
Glucose: 84 mg/dL (ref 70–99)
Potassium: 3.8 mmol/L (ref 3.5–5.2)
Sodium: 139 mmol/L (ref 134–144)
Total Protein: 6.6 g/dL (ref 6.0–8.5)
eGFR: 59 mL/min/{1.73_m2} — ABNORMAL LOW (ref >59)

## 2022-05-22 LAB — LIPID PANEL
Chol/HDL Ratio: 2.8 ratio (ref 0.0–4.4)
Cholesterol, Total: 163 mg/dL (ref 100–199)
HDL: 59 mg/dL (ref >39)
LDL Chol Calc (NIH): 80 mg/dL (ref 0–99)
Triglycerides: 141 mg/dL (ref 0–149)
VLDL Cholesterol Cal: 24 mg/dL (ref 5–40)

## 2022-05-22 LAB — VITAMIN B12: Vitamin B-12: >2000 pg/mL — ABNORMAL HIGH (ref 232–1245)

## 2022-05-22 LAB — CBC WITH DIFFERENTIAL/PLATELET
Basophils Absolute: 0 10*3/uL (ref 0.0–0.2)
Basos: 0 %
EOS (ABSOLUTE): 0.1 10*3/uL (ref 0.0–0.4)
Eos: 1 %
Hematocrit: 43.5 % (ref 34.0–46.6)
Hemoglobin: 14.5 g/dL (ref 11.1–15.9)
Immature Grans (Abs): 0 10*3/uL (ref 0.0–0.1)
Immature Granulocytes: 0 %
Lymphocytes Absolute: 1.9 10*3/uL (ref 0.7–3.1)
Lymphs: 28 %
MCH: 30.6 pg (ref 26.6–33.0)
MCHC: 33.3 g/dL (ref 31.5–35.7)
MCV: 92 fL (ref 79–97)
Monocytes Absolute: 0.4 10*3/uL (ref 0.1–0.9)
Monocytes: 6 %
Neutrophils Absolute: 4.3 10*3/uL (ref 1.4–7.0)
Neutrophils: 65 %
Platelets: 282 10*3/uL (ref 150–450)
RBC: 4.74 x10E6/uL (ref 3.77–5.28)
RDW: 12.2 % (ref 11.7–15.4)
WBC: 6.7 10*3/uL (ref 3.4–10.8)

## 2022-05-22 LAB — CARDIOVASCULAR RISK ASSESSMENT

## 2022-05-22 LAB — VITAMIN D 25 HYDROXY (VIT D DEFICIENCY, FRACTURES): Vit D, 25-Hydroxy: 43.5 ng/mL (ref 30.0–100.0)

## 2022-05-22 LAB — METHYLMALONIC ACID, SERUM: Methylmalonic Acid: 187 nmol/L (ref 0–378)

## 2022-05-23 NOTE — Progress Notes (Signed)
Blood count normal.  Liver function normal.  Kidney function slightly abnormal.  Recommend hydrate.   Cholesterol: Improved.  LDL goal is less than 55.  This has changed because of her cholesterol buildup in her arteries.  Recommend increase Crestor to 40 mg nightly Follow-up is high now.  I would recommend patient do a B12 shot once a month or over-the-counter B12 2500 mg sublingual daily. Vitamin D normal.  Continue vitamin D supplement

## 2022-05-24 ENCOUNTER — Other Ambulatory Visit: Payer: Self-pay

## 2022-05-24 MED ORDER — ROSUVASTATIN CALCIUM 40 MG PO TABS: 40.0000 mg | ORAL_TABLET | Freq: Every day | ORAL | 1 refills | Status: DC

## 2022-05-26 DIAGNOSIS — E538 Deficiency of other specified B group vitamins: Secondary | ICD-10-CM | POA: Diagnosis not present

## 2022-05-26 DIAGNOSIS — Z23 Encounter for immunization: Secondary | ICD-10-CM | POA: Diagnosis not present

## 2022-05-28 ENCOUNTER — Encounter: Payer: Self-pay | Admitting: Family Medicine

## 2022-05-28 DIAGNOSIS — Z23 Encounter for immunization: Secondary | ICD-10-CM

## 2022-05-28 DIAGNOSIS — E538 Deficiency of other specified B group vitamins: Secondary | ICD-10-CM | POA: Insufficient documentation

## 2022-05-28 HISTORY — DX: Encounter for immunization: Z23

## 2022-05-28 HISTORY — DX: Deficiency of other specified B group vitamins: E53.8

## 2022-05-28 NOTE — Assessment & Plan Note (Signed)
Recommend otc miralax.

## 2022-05-28 NOTE — Assessment & Plan Note (Signed)
Advanced

## 2022-05-28 NOTE — Assessment & Plan Note (Signed)
On no inhalers.  Found on ct scan.  Pt has stopped smoking.

## 2022-05-28 NOTE — Assessment & Plan Note (Signed)
Well controlled.  ?No changes to medicines.  ?Continue to work on eating a healthy diet and exercise.  ?Labs drawn today.  ?

## 2022-05-28 NOTE — Assessment & Plan Note (Signed)
Stented. Follow up with cardiology and interventional radiology.

## 2022-05-28 NOTE — Assessment & Plan Note (Signed)
Continue omeprazole.  Management per specialist.  Dr. Lyda Jester.

## 2022-05-28 NOTE — Assessment & Plan Note (Signed)
Check b12 level  

## 2022-05-28 NOTE — Assessment & Plan Note (Signed)
prolia was denied. Recommended try alendronate.

## 2022-05-28 NOTE — Assessment & Plan Note (Signed)
Continue omeprazole 40 mg daily

## 2022-05-28 NOTE — Assessment & Plan Note (Signed)
Check labs 

## 2022-05-28 NOTE — Assessment & Plan Note (Signed)
Check vitamin D level 

## 2022-05-28 NOTE — Assessment & Plan Note (Signed)
>>  ASSESSMENT AND PLAN FOR FAMILIAL HYPERLIPIDEMIA WRITTEN ON 05/28/2022 12:00 PM BY Kashari Chalmers, MD  Well controlled.  No changes to medicines.  Continue to work on eating a healthy diet and exercise.  Labs drawn today.

## 2022-05-28 NOTE — Assessment & Plan Note (Signed)
Continue cpap.  

## 2022-06-05 ENCOUNTER — Telehealth (HOSPITAL_COMMUNITY): Payer: Self-pay | Admitting: Radiology

## 2022-06-05 NOTE — Telephone Encounter (Signed)
62 y.o. female. Known to IR. 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 Kathlene Cote.   Verified patient's name and date of birthday to confirm that I was talking to the right person.Patient reporting persistent and worsening abdominal pain, distention and constipation X 2 weeks. Patient states that the condition is made worse after eating and has currently placed herself on a soft diet.  She denies any GI bleeding, lightheadness, dizziness, SHOB, or chest pain at this time. Patient states she would prefer not to go to the ED at this time. She verbalized understanding and is in agreement that should her condition change or worsen she should go to the nearest emergency room or further evaluation.   Please call IR for questions and concerns.

## 2022-06-12 ENCOUNTER — Ambulatory Visit
Admission: RE | Admit: 2022-06-12 | Discharge: 2022-06-12 | Disposition: A | Discharge status: Home / Self Care | Payer: Medicare HMO | Source: Ambulatory Visit | Attending: Interventional Radiology | Admitting: Interventional Radiology

## 2022-06-12 ENCOUNTER — Encounter: Payer: Self-pay | Admitting: Lab

## 2022-06-12 DIAGNOSIS — Z9889 Other specified postprocedural states: Secondary | ICD-10-CM | POA: Diagnosis not present

## 2022-06-12 DIAGNOSIS — Z95828 Presence of other vascular implants and grafts: Secondary | ICD-10-CM | POA: Diagnosis not present

## 2022-06-12 DIAGNOSIS — K55059 Acute (reversible) ischemia of intestine, part and extent unspecified: Secondary | ICD-10-CM | POA: Diagnosis not present

## 2022-06-12 DIAGNOSIS — K551 Chronic vascular disorders of intestine: Secondary | ICD-10-CM

## 2022-06-12 HISTORY — PX: IR RADIOLOGIST EVAL & MGMT: IMG5224

## 2022-06-12 NOTE — Progress Notes (Signed)
Chief Complaint: Patient was seen in consultation today for follow-up after SMA stent placement on 03/02/2022.  History of Present Illness: Christina Cantrell is a 61 y.o. 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  She did have some recent worsening of abdominal pain and constipation over roughly 2 weeks.  This has now improved and she attributes the symptoms to chronic constipation.  She has been trying some different medications for constipation.  Since the procedure, she continues to have better appetite and no significant pain with eating as she did prior to stent placement.  She has gained some weight back that she had lost prior to the procedure.  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   Centrilobular emphysema ([place]) 10/04/2021   Cervical radiculopathy 07/21/2021   Chronic low back pain 06/04/2013   Degeneration of lumbar intervertebral disc 06/04/2013   Depression 09/16/2020   Epigastric pain 10/04/2021   Esophageal dysphagia 02/13/2022   Fatigue 09/16/2020   Flank pain 10/04/2021   Gastroesophageal reflux disease 02/13/2022   Left lower quadrant abdominal pain 12/29/2021   Leg edema, left 12/29/2021   Localized osteoporosis without current pathological fracture 06/27/2021   Mixed hyperlipidemia 06/27/2021   Multiple sclerosis ([place]) 1991   Age 44 yo   Neck pain 09/12/2021   Neuropathic pain 11/12/2017   Optic neuritis 09/17/2018   Osteoarthritis 09/17/2018   Osteopenia 11/20/2018   Other chest pain 02/18/2022   Pain in thoracic spine 06/04/2013   Paresthesia 02/13/2022   Polyuria 02/18/2022   Screening for lung cancer 12/29/2021   Spinal stenosis 09/17/2018   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 Authorizing Provider  albuterol (VENTOLIN HFA) 108 (90 Base) MCG/ACT inhaler Inhale 2 puffs into the lungs every 6 (six) hours as needed for wheezing or shortness of breath.    [provider]  aspirin EC 81 MG tablet Take 81 mg by mouth daily. Swallow whole.    [provider]  citalopram (CELEXA) 40 MG tablet Take 40 mg by mouth daily. 08/02/20   [provider]  clopidogrel (PLAVIX) 75 MG tablet Take 1 tablet (75 mg total) by mouth daily. 03/02/22   Docia Barrier, PA  cyanocobalamin (VITAMIN B12) 1000 MCG/ML injection Inject 1 mL (1,000 mcg total) into the muscle every 30 (thirty) days. 04/24/22   Cox, Elnita Maxwell, MD  cyclobenzaprine (FLEXERIL) 10 MG tablet Take 10 mg by mouth 3 (three) times daily as needed for muscle spasms. 07/31/20   [provider]  doxycycline (PERIOSTAT) 20 MG tablet Take 20 mg by mouth 2 (two) times daily. 12/07/21   [provider]  fentaNYL (DURAGESIC) 75 MCG/HR Place 1 patch onto the skin 2 days.    [provider]  ketoconazole (NIZORAL) 2 % shampoo Apply 1 Application topically daily as needed for irritation.    [provider]  Loteprednol Etabonate 0.5 % GEL Place 1 drop into both eyes daily as needed (optic neuritis).    [provider]  metroNIDAZOLE (METROCREAM) 0.75 % cream Apply 1  Application topically 2 (two) times daily. 09/07/21   [provider]  modafinil (PROVIGIL) 100 MG tablet Take 200 mg by mouth daily as needed (narcolepsy).    [provider]  nitroGLYCERIN (NITROSTAT) 0.4 MG SL tablet Place 1 tablet (0.4 mg total) under the tongue every 5 (five) minutes as needed. 03/16/22 06/14/22  Revankar, Reita Cliche, MD  norethindrone-ethinyl estradiol (FYAVOLV) 1-5 MG-MCG TABS tablet Take 1 tablet by mouth daily.    [provider]  omeprazole (PRILOSEC) 40 MG capsule TAKE 1 CAPSULE (40 MG TOTAL) BY MOUTH DAILY. 05/15/22   Cox, Elnita Maxwell, MD  oxyCODONE-acetaminophen (PERCOCET) 10-325 MG tablet Take 1 tablet by mouth every 4 (four) hours as needed for pain. 08/13/20   [provider]  Polyethyl Glycol-Propyl Glycol (SYSTANE OP) Place 1 drop into both eyes daily as needed (dry eyes).    [provider]  rosuvastatin (CRESTOR) 40 MG tablet Take 1 tablet (40 mg total) by mouth daily. 05/24/22   Cox, Elnita Maxwell, MD  Vitamin D, Ergocalciferol, (DRISDOL) 1.25 MG (50000 UNIT) CAPS capsule TAKE 1 CAPSULE (50,000 UNITS TOTAL) BY MOUTH TWO TIMES A WEEK 05/04/22   Rochel Brome, MD     No family history on file.  Social History   Socioeconomic History   Marital status: Married    Spouse name: Not on file   Number of children: Not on file   Years of education: Not on file   Highest education level: Not on file  Occupational History   Not on file  Tobacco Use   Smoking status: Former    Years: 20.00    Types: Cigarettes    Quit date: 2018    Years since quitting: 5.7   Smokeless tobacco: Never  Substance and Sexual Activity   Alcohol use: Never   Drug use: Never   Sexual activity: Not on file  Other Topics Concern   Not on file  Social History Narrative   Not on file   Social Determinants of Health   Financial Resource Strain: Not on file  Food Insecurity: Not on file  Transportation Needs: Not on file  Physical Activity: Not on file   Stress: Not on file  Social Connections: Not on file    Review of Systems: A 12 point ROS discussed and pertinent positives are indicated in the HPI above.  All other systems are negative.  Review of Systems  Constitutional: Negative.   Respiratory: Negative.    Cardiovascular: Negative.   Gastrointestinal:  Positive for constipation. Negative for abdominal distention, abdominal pain, blood in stool, nausea and vomiting.  Genitourinary: Negative.   Musculoskeletal: Negative.   Neurological: Negative.     Vital Signs: There were no vitals taken for this visit.    Physical Exam Constitutional:      General: She is not in acute distress.    Appearance: Normal appearance. She is not ill-appearing or toxic-appearing.  Neurological:     General: No focal deficit present.     Mental Status: She is alert and oriented to person, place, and time.  Imaging: [place] MESENTERIC ARTERIES  Result Date: 06/12/2022 CLINICAL DATA:  Status post intravascular stent placement in proximal superior mesenteric artery to treat critical stenosis on 03/02/2022. Mesenteric duplex ultrasound now performed to establish baseline velocities for follow-up. EXAM: [place] MESENTERIC ARTERIAL DOPPLER COMPARISON:  Mesenteric arteriography and intervention on 03/02/2022 and CT of the abdomen on 02/03/2022. FINDINGS: Celiac axis: 151 cm/sec Splenic artery: 165 cm/sec Hepatic artery: 204 cm/sec SMA: 197 proximal, 107 mid, 80 distal cm/sec. Proximal SMA stent demonstrates normal patency. IMA: 145 cm/sec Aorta: 71 proximal, 73 mid, 95 distal cm/sec Aortic size: 2.0 cm proximally, 1.3 cm in the mid segment and 0.9 cm distally Atherosclerosis of the abdominal aorta present. IMPRESSION: Widely patent SMA stent. No evidence of flow-limiting stenosis. Electronically Signed   By: Aletta Edouard M.D.   On: 06/12/2022 09:47    Labs:  CBC: Recent Labs    10/04/21 1128 02/13/22 1159 03/02/22 0752 05/18/22 0845  WBC 7.9 6.2  6.9 6.7  HGB 14.5 13.5 12.7 14.5  HCT 43.6 41.5 37.3 43.5  PLT 328 336 319 282    COAGS: Recent Labs    03/02/22 0820  INR 1.0    BMP: Recent Labs    10/04/21 1128 02/13/22 1159 03/02/22 0752 05/18/22 0845  NA 142 139 138 139  K 4.0 4.2 5.6* 3.8  CL 103 99 104 99  CO2 '24 23 27 24  '$ GLUCOSE 75 78 89 84  BUN '6 10 13 8  '$ CALCIUM 9.7 9.6 8.9 9.7  CREATININE 0.90 0.95 0.87 1.08*  GFRNONAA  --   --  >60  --     LIVER FUNCTION TESTS: Recent Labs    06/27/21 0758 10/04/21 1128 02/13/22 1159 05/18/22 0845  BILITOT 0.4 0.5 0.6 0.5  AST '14 20 17 20  '$ ALT '7 22 10 11  '$ ALKPHOS 50 62 60 65  PROT 6.5 6.6 6.8 6.6  ALBUMIN 4.5 4.4 4.6 4.4    Assessment and Plan:  Mesenteric duplex ultrasound was performed today to establish baseline velocities and evaluate patency of the SMA stent.  SMA velocities are normal and the stent appears widely patent.  I recommended a follow-up duplex ultrasound in 1 year.  Should she have any recurrent symptoms of intestinal angina before that time, she will let me know and we would consider CTA at that time.  I recommended continued Plavix for 6 to 9 months after stent placement and lifelong aspirin therapy.  Electronically Signed: Azzie Roup 06/12/2022, 9:50 AM    I spent a total of 15 Minutes in face to face in clinical consultation, greater than 50% of which was counseling/coordinating care post stenting of SMA.

## 2022-06-29 ENCOUNTER — Telehealth: Payer: Self-pay

## 2022-06-29 ENCOUNTER — Other Ambulatory Visit: Payer: Self-pay

## 2022-06-29 MED ORDER — ROSUVASTATIN CALCIUM 40 MG PO TABS: 20.0000 mg | ORAL_TABLET | Freq: Every day | ORAL | 1 refills | Status: DC

## 2022-06-29 NOTE — Telephone Encounter (Signed)
Christina Cantrell called to report that she has been having increased myalgias since increasing the Crestor to 40 mg daily.  Dr. Tobie Poet advised that she decrease the dose to 20 mg daily.  She was encouraged to call us back if symptoms do not improve.

## 2022-08-01 DIAGNOSIS — M792 Neuralgia and neuritis, unspecified: Secondary | ICD-10-CM | POA: Diagnosis not present

## 2022-08-01 DIAGNOSIS — M5136 Other intervertebral disc degeneration, lumbar region: Secondary | ICD-10-CM | POA: Diagnosis not present

## 2022-08-01 DIAGNOSIS — G35 Multiple sclerosis: Secondary | ICD-10-CM | POA: Diagnosis not present

## 2022-08-01 DIAGNOSIS — M5416 Radiculopathy, lumbar region: Secondary | ICD-10-CM | POA: Diagnosis not present

## 2022-08-01 DIAGNOSIS — M542 Cervicalgia: Secondary | ICD-10-CM | POA: Diagnosis not present

## 2022-08-09 ENCOUNTER — Other Ambulatory Visit: Payer: Self-pay | Admitting: Surgery

## 2022-08-09 DIAGNOSIS — Z1231 Encounter for screening mammogram for malignant neoplasm of breast: Secondary | ICD-10-CM

## 2022-08-09 DIAGNOSIS — N6012 Diffuse cystic mastopathy of left breast: Secondary | ICD-10-CM | POA: Diagnosis not present

## 2022-08-09 DIAGNOSIS — N6011 Diffuse cystic mastopathy of right breast: Secondary | ICD-10-CM | POA: Diagnosis not present

## 2022-08-22 ENCOUNTER — Ambulatory Visit: Payer: Medicare HMO | Admitting: Nurse Practitioner

## 2022-08-22 ENCOUNTER — Telehealth (HOSPITAL_COMMUNITY): Payer: Self-pay | Admitting: Student

## 2022-08-22 MED ORDER — CLOPIDOGREL BISULFATE 75 MG PO TABS: 75.0000 mg | ORAL_TABLET | Freq: Every day | ORAL | 1 refills | Status: DC

## 2022-08-22 NOTE — Telephone Encounter (Signed)
Plavix e-prescribed to [facility] in [place]. 75 mg PO daily, dispense 90 tablets with one refill. Patient was notified of prescription being refilled.  Soyla Dryer, [place] [phone] 08/22/2022, 12:29 PM  `

## 2022-08-28 ENCOUNTER — Emergency Department (HOSPITAL_COMMUNITY)
Admission: EM | Admit: 2022-08-28 | Discharge: 2022-08-29 | Disposition: A | Discharge status: Home / Self Care | Payer: Medicare HMO | Attending: Emergency Medicine | Admitting: Emergency Medicine

## 2022-08-28 ENCOUNTER — Other Ambulatory Visit: Payer: Self-pay

## 2022-08-28 ENCOUNTER — Encounter (HOSPITAL_COMMUNITY): Payer: Self-pay

## 2022-08-28 DIAGNOSIS — Z7982 Long term (current) use of aspirin: Secondary | ICD-10-CM | POA: Diagnosis not present

## 2022-08-28 DIAGNOSIS — K5900 Constipation, unspecified: Secondary | ICD-10-CM | POA: Diagnosis not present

## 2022-08-28 DIAGNOSIS — R1084 Generalized abdominal pain: Secondary | ICD-10-CM | POA: Diagnosis not present

## 2022-08-28 DIAGNOSIS — R110 Nausea: Secondary | ICD-10-CM | POA: Insufficient documentation

## 2022-08-28 DIAGNOSIS — R109 Unspecified abdominal pain: Secondary | ICD-10-CM | POA: Diagnosis not present

## 2022-08-28 DIAGNOSIS — K760 Fatty (change of) liver, not elsewhere classified: Secondary | ICD-10-CM | POA: Diagnosis not present

## 2022-08-28 DIAGNOSIS — R197 Diarrhea, unspecified: Secondary | ICD-10-CM | POA: Diagnosis not present

## 2022-08-28 LAB — CBC WITH DIFFERENTIAL/PLATELET
Abs Immature Granulocytes: 0.01 10*3/uL (ref 0.00–0.07)
Basophils Absolute: 0 10*3/uL (ref 0.0–0.1)
Basophils Relative: 1 %
Eosinophils Absolute: 0 10*3/uL (ref 0.0–0.5)
Eosinophils Relative: 0 %
HCT: 45.9 % (ref 36.0–46.0)
Hemoglobin: 15.5 g/dL — ABNORMAL HIGH (ref 12.0–15.0)
Immature Granulocytes: 0 %
Lymphocytes Relative: 26 %
Lymphs Abs: 1.7 10*3/uL (ref 0.7–4.0)
MCH: 31 pg (ref 26.0–34.0)
MCHC: 33.8 g/dL (ref 30.0–36.0)
MCV: 91.8 fL (ref 80.0–100.0)
Monocytes Absolute: 0.3 10*3/uL (ref 0.1–1.0)
Monocytes Relative: 5 %
Neutro Abs: 4.5 10*3/uL (ref 1.7–7.7)
Neutrophils Relative %: 68 %
Platelets: 339 10*3/uL (ref 150–400)
RBC: 5 MIL/uL (ref 3.87–5.11)
RDW: 12.3 % (ref 11.5–15.5)
WBC: 6.6 10*3/uL (ref 4.0–10.5)
nRBC: 0 % (ref 0.0–0.2)

## 2022-08-28 LAB — COMPREHENSIVE METABOLIC PANEL
ALT: 10 U/L (ref 0–44)
AST: 18 U/L (ref 15–41)
Albumin: 3.9 g/dL (ref 3.5–5.0)
Alkaline Phosphatase: 54 U/L (ref 38–126)
Anion gap: 10 (ref 5–15)
BUN: 11 mg/dL (ref 6–20)
CO2: 24 mmol/L (ref 22–32)
Calcium: 9.2 mg/dL (ref 8.9–10.3)
Chloride: 103 mmol/L (ref 98–111)
Creatinine, Ser: 0.97 mg/dL (ref 0.44–1.00)
GFR, Estimated: >60 mL/min (ref >60)
Glucose, Bld: 132 mg/dL — ABNORMAL HIGH (ref 70–99)
Potassium: 3.8 mmol/L (ref 3.5–5.1)
Sodium: 137 mmol/L (ref 135–145)
Total Bilirubin: 0.5 mg/dL (ref 0.3–1.2)
Total Protein: 6.7 g/dL (ref 6.5–8.1)

## 2022-08-28 LAB — URINALYSIS, ROUTINE W REFLEX MICROSCOPIC
Bilirubin Urine: NEGATIVE
Glucose, UA: NEGATIVE mg/dL
Ketones, ur: NEGATIVE mg/dL
Nitrite: NEGATIVE
Protein, ur: 30 mg/dL — AB
Specific Gravity, Urine: 1.025 (ref 1.005–1.030)
pH: 5 (ref 5.0–8.0)

## 2022-08-28 LAB — I-STAT CHEM 8, ED
BUN: 11 mg/dL (ref 6–20)
Calcium, Ion: 1.19 mmol/L (ref 1.15–1.40)
Chloride: 101 mmol/L (ref 98–111)
Creatinine, Ser: 1 mg/dL (ref 0.44–1.00)
Glucose, Bld: 127 mg/dL — ABNORMAL HIGH (ref 70–99)
HCT: 47 % — ABNORMAL HIGH (ref 36.0–46.0)
Hemoglobin: 16 g/dL — ABNORMAL HIGH (ref 12.0–15.0)
Potassium: 3.8 mmol/L (ref 3.5–5.1)
Sodium: 137 mmol/L (ref 135–145)
TCO2: 24 mmol/L (ref 22–32)

## 2022-08-28 LAB — LIPASE, BLOOD: Lipase: 30 U/L (ref 11–51)

## 2022-08-28 NOTE — ED Provider Triage Note (Signed)
Emergency Medicine Provider Triage Evaluation Note  Christina Cantrell , a 61 y.o. female  was evaluated in triage.  Pt complains of abdominal pain, nausea, cold sweats for the past few days.  She reports it "feels like a heart attack in her abdomen".  Patient reports diarrhea that began today.  She has a history of SMA stenosis with 80% blockage that has had to be stented in the past before.  She is concerned that she has another blockage of her SMA.  Denies urinary symptoms, vomiting, chest pain, fever.  Review of Systems  Positive: As above Negative: As above  Physical Exam  BP (!) 128/100 (BP Location: Right Arm)   Pulse 92   Temp 98.6 F (37 C) (Oral)   Resp 15   Ht '5\' 4"'$  (1.626 m)   Wt 59 kg   SpO2 98%   BMI 22.31 kg/m  Gen:   Awake, no distress   Resp:  Normal effort  MSK:   Moves extremities without difficulty  Other:  Abdomen tender to palpation, soft and non distended  Medical Decision Making  Medically screening exam initiated at 4:45 PM.  Appropriate orders placed.  Orene Desanctis was informed that the remainder of the evaluation will be completed by another provider, this initial triage assessment does not replace that evaluation, and the importance of remaining in the ED until their evaluation is complete.     Theressa Stamps R, [place] 08/28/22 [phone]

## 2022-08-28 NOTE — ED Triage Notes (Signed)
Pt reports abdominal discomfort, nausea, and cold sweats for the past couple of days. Reports diarrhea that started today. She reports hx of SMA stenosis. Pt is AxOx4.

## 2022-08-29 ENCOUNTER — Emergency Department (HOSPITAL_COMMUNITY): Payer: Medicare HMO

## 2022-08-29 DIAGNOSIS — R197 Diarrhea, unspecified: Secondary | ICD-10-CM | POA: Diagnosis not present

## 2022-08-29 DIAGNOSIS — K760 Fatty (change of) liver, not elsewhere classified: Secondary | ICD-10-CM | POA: Diagnosis not present

## 2022-08-29 DIAGNOSIS — K5900 Constipation, unspecified: Secondary | ICD-10-CM | POA: Diagnosis not present

## 2022-08-29 DIAGNOSIS — R109 Unspecified abdominal pain: Secondary | ICD-10-CM | POA: Diagnosis not present

## 2022-08-29 MED ORDER — IOHEXOL 350 MG/ML SOLN
75.0000 mL | Freq: Once | INTRAVENOUS | Status: AC | PRN
  Administered 2022-08-29: 75 mL via INTRAVENOUS

## 2022-08-29 NOTE — ED Notes (Signed)
Patient transported to CT 

## 2022-08-29 NOTE — ED Provider Notes (Signed)
[facility] EMERGENCY DEPARTMENT Provider Note   CSN: [id number] Arrival date & time: 08/28/22  1426     History  Chief Complaint  Patient presents with   Abdominal Pain   Nausea   Diarrhea    Christina Cantrell is a 61 y.o. female.  Patient presents to the emergency ferment for evaluation of abdominal pain, diarrhea.  Patient has been experiencing abdominal pain for couple of days.  Symptoms began after she ate some fried chicken which she does not normally eat.  Patient reports voluminous watery, nonbloody diarrhea.  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  Patient came to the emergency department tonight because of persistent pain.  She is concerned that this is related to mesenteric ischemia.  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       Home Medications Prior to Admission medications   Medication Sig Start Date End Date Taking? Authorizing Provider  albuterol (VENTOLIN HFA) 108 (90 Base) MCG/ACT inhaler Inhale 2 puffs into the lungs every 6 (six) hours as needed for wheezing or shortness of breath.    [provider]  aspirin EC 81 MG tablet Take 81 mg by mouth daily. Swallow whole.    [provider]  citalopram (CELEXA) 40 MG tablet Take 40 mg by mouth daily. 08/02/20   [provider]  clopidogrel (PLAVIX) 75 MG tablet Take 1 tablet (75 mg total) by mouth daily. 08/22/22   Theresa Duty, NP  cyanocobalamin (VITAMIN B12) 1000 MCG/ML injection Inject 1 mL (1,000 mcg total) into the muscle every 30 (thirty) days. 04/24/22   Cox, Elnita Maxwell, MD  cyclobenzaprine (FLEXERIL) 10 MG tablet Take 10 mg by mouth 3 (three) times daily as needed for muscle spasms. 07/31/20   [provider]  doxycycline (PERIOSTAT) 20 MG tablet Take 20 mg by mouth 2 (two) times daily. 12/07/21    [provider]  fentaNYL (DURAGESIC) 75 MCG/HR Place 1 patch onto the skin 2 days.    [provider]  ketoconazole (NIZORAL) 2 % shampoo Apply 1 Application topically daily as needed for irritation.    [provider]  Loteprednol Etabonate 0.5 % GEL Place 1 drop into both eyes daily as needed (optic neuritis).    [provider]  metroNIDAZOLE (METROCREAM) 0.75 % cream Apply 1 Application topically 2 (two) times daily. 09/07/21   [provider]  modafinil (PROVIGIL) 100 MG tablet Take 200 mg by mouth daily as needed (narcolepsy).    [provider]  nitroGLYCERIN (NITROSTAT) 0.4 MG SL tablet Place 1 tablet (0.4 mg total) under the tongue every 5 (five) minutes as needed. 03/16/22 06/14/22  Revankar, Reita Cliche, MD  norethindrone-ethinyl estradiol (FYAVOLV) 1-5 MG-MCG TABS tablet Take 1 tablet by mouth daily.    [provider]  omeprazole (PRILOSEC) 40 MG capsule TAKE 1 CAPSULE (40 MG TOTAL) BY MOUTH DAILY. 05/15/22   Cox, Elnita Maxwell, MD  oxyCODONE-acetaminophen (PERCOCET) 10-325 MG tablet Take 1 tablet by mouth every 4 (four) hours as needed for pain. 08/13/20   [provider]  Polyethyl Glycol-Propyl Glycol (SYSTANE OP) Place 1 drop into both eyes daily as needed (dry eyes).    [provider]  rosuvastatin (CRESTOR) 40 MG tablet Take 0.5 tablets (20 mg total) by mouth daily. 06/29/22   CoxElnita Maxwell, MD  Vitamin D, Ergocalciferol, (DRISDOL) 1.25 MG (  50000 UNIT) CAPS capsule TAKE 1 CAPSULE (50,000 UNITS TOTAL) BY MOUTH TWO TIMES A WEEK 05/04/22   Cox, Kirsten, MD      Allergies    Sertraline hcl    Review of Systems   Review of Systems  Physical Exam Updated Vital Signs BP 116/61 (BP Location: Left Arm)   Pulse 81   Temp 98 F (36.7 C) (Oral)   Resp 18   Ht '5\' 4"'$  (1.626 m)   Wt 59 kg   SpO2 96%   BMI 22.31 kg/m  Physical Exam Vitals and nursing note reviewed.  Constitutional:      General: She is not in  acute distress.    Appearance: She is well-developed.  HENT:     Head: Normocephalic and atraumatic.     Mouth/Throat:     Mouth: Mucous membranes are moist.  Eyes:     General: Vision grossly intact. Gaze aligned appropriately.     Extraocular Movements: Extraocular movements intact.     Conjunctiva/sclera: Conjunctivae normal.  Cardiovascular:     Rate and Rhythm: Normal rate and regular rhythm.     Pulses: Normal pulses.     Heart sounds: Normal heart sounds, S1 normal and S2 normal. No murmur heard.    No friction rub. No gallop.  Pulmonary:     Effort: Pulmonary effort is normal. No respiratory distress.     Breath sounds: Normal breath sounds.  Abdominal:     General: Bowel sounds are normal.     Palpations: Abdomen is soft.     Tenderness: There is generalized abdominal tenderness. There is no guarding or rebound.     Hernia: No hernia is present.  Musculoskeletal:        General: No swelling.     Cervical back: Full passive range of motion without pain, normal range of motion and neck supple. No spinous process tenderness or muscular tenderness. Normal range of motion.     Right lower leg: No edema.     Left lower leg: No edema.  Skin:    General: Skin is warm and dry.     Capillary Refill: Capillary refill takes less than 2 seconds.     Findings: No ecchymosis, erythema, rash or wound.  Neurological:     General: No focal deficit present.     Mental Status: She is alert and oriented to person, place, and time.     GCS: GCS eye subscore is 4. GCS verbal subscore is 5. GCS motor subscore is 6.     Cranial Nerves: Cranial nerves 2-12 are intact.     Sensory: Sensation is intact.     Motor: Motor function is intact.     Coordination: Coordination is intact.  Psychiatric:        Attention and Perception: Attention normal.        Mood and Affect: Mood normal.        Speech: Speech normal.        Behavior: Behavior normal.     ED Results / Procedures / Treatments    Labs (all labs ordered are listed, but only abnormal results are displayed) Labs Reviewed  COMPREHENSIVE METABOLIC PANEL - Abnormal; Notable for the following components:      Result Value   Glucose, Bld 132 (*)    All other components within normal limits  CBC WITH DIFFERENTIAL/PLATELET - Abnormal; Notable for the following components:   Hemoglobin 15.5 (*)    All other components within normal limits  URINALYSIS, ROUTINE W REFLEX MICROSCOPIC - Abnormal; Notable for the following components:   APPearance HAZY (*)    Hgb urine dipstick SMALL (*)    Protein, ur 30 (*)    Leukocytes,Ua SMALL (*)    Bacteria, UA RARE (*)    All other components within normal limits  I-STAT CHEM 8, ED - Abnormal; Notable for the following components:   Glucose, Bld 127 (*)    Hemoglobin 16.0 (*)    HCT 47.0 (*)    All other components within normal limits  LIPASE, BLOOD    EKG None  Radiology CT Angio Abd/Pel W and/or Wo Contrast  Result Date: 08/29/2022 CLINICAL DATA:  Clinical concern mesenteric ischemia. 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 Presenting with diarrhea and abdominal pain x3 days. EXAM: CTA ABDOMEN AND PELVIS WITHOUT AND WITH CONTRAST TECHNIQUE: Multidetector CT imaging of the abdomen and pelvis was performed using the standard protocol during bolus administration of intravenous contrast. Multiplanar reconstructed images and MIPs were obtained and reviewed to evaluate the vascular anatomy. RADIATION DOSE REDUCTION: This exam was performed according to the departmental dose-optimization program which includes automated exposure control, adjustment of the mA and/or kV according to patient size and/or use of iterative reconstruction technique. CONTRAST:  64m OMNIPAQUE IOHEXOL 350 MG/ML SOLN COMPARISON:  CT abdomen and pelvis with IV contrast 02/03/2022, CT abdomen and pelvis without contrast 01/12/2016. FINDINGS: VASCULAR Aorta: There is moderate  aortoiliac mixed plaque. Soft plaque in the distal abdominal aorta causes a 40% focal stenosis just above the iliac bifurcation, otherwise it is without flow-limiting stenosis. There is no aneurysm or dissection. Celiac: Normal. SMA: Interval stenting of the proximal 1.9 cm of the SMA. There is no flow-limiting stenosis, aneurysm or dissection. Renals: Both renal arteries are single. There are nonstenosing ostial calcifications of the left renal artery. Otherwise the renal arteries are unremarkable. IMA: There is a high-grade soft plaque origin stenosis. The vessel otherwise opacifies well. Inflow: Both of the common iliac arteries are approximately 40-50% stenotic due to mixed plaque. There are patchy calcifications in both internal iliac arteries and up to 50% irregular stenosis of both. The external iliac arteries are unremarkable. Proximal Outflow: There are mild calcific plaques in the common femoral and proximal deep femoral arteries bilaterally but no flow-limiting stenosis in the visualized portions. The proximal superficial femoral arteries are both clear. Veins: Patent including the IVC, hepatic portal vein and major divisions, and pelvic deep veins. Review of the MIP images confirms the above findings. NON-VASCULAR Lower chest: Tiny calcified granuloma noted laterally in the right lower lobe. There are scattered linear scar-like opacities. Lung bases are clear of infiltrates. Hepatobiliary: The liver is mildly steatotic. There is a 1.5 cm bilobed cyst again noted in the left lobe medial segment. There are additional, several subcentimeter hypodensities in the hepatic substance which are too small to characterize. There is a 1.1 cm hemangioma again noted posteriorly in the right lobe on image 30 of series 11. There is no hepatic mass enhancement. Gallbladder and bile ducts are unremarkable. Pancreas: In the anterior pancreatic head, again noted is a fat density lesion measuring 1.6 x 1.3 cm, unchanged  since 02/03/2022 but in 2017 measured 1.1 x 1.1 cm. There is no ductal dilatation or other pancreatic lesion. Spleen: Normal. Adrenals/Urinary Tract: There is no adrenal mass. A small hypodensity in the medial lower pole left kidney is again noted to small to characterize, and may or may not  have been present on the prior noncontrast CT. No follow-up imaging is recommended. Both kidneys otherwise are homogeneous in enhancement. No urinary stone or obstruction is evident. The bladder is contracted and not well seen. Stomach/Bowel: No dilatation or wall thickening including of the retrocecal appendix. Moderate stool retention appears similar in the ascending, transverse and proximal descending colon. Scattered sigmoid diverticula noted without diverticulitis. Lymphatic: No adenopathy is seen. Reproductive: Uterus and bilateral adnexa are unremarkable. Other: There is no free air, free hemorrhage, free fluid or incarcerated hernia. 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 There are scattered vertebral body hemangiomas but no destructive lesions. IMPRESSION: 1. Aortic and branch vessel atherosclerosis, with aortoiliac mixed plaque and 40% distal aortic soft plaque stenosis. 2. Interval stenting of the proximal SMA with no flow-limiting stenosis. 3. 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 Remainder of the vessel opacifies well. 4. 40-50% stenosis of both common iliac arteries and up to 50% irregular stenosis of both internal iliac arteries. 5. No bowel wall thickening or mesenteric edema to suggest mesenteric ischemia. 6. Constipation and diverticulosis. 7. 1.6 x 1.3 cm fat density lesion in the anterior pancreatic head, unchanged since 02/03/2022 but increased in size since 2017. This could be a lipoma  or a low-grade liposarcoma. 8. Hepatic steatosis with stable 1.5 cm cyst in the left lobe and additional subcentimeter hypodensities which are too small to characterize. Small hemangioma as well. 9. 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 Aortic Atherosclerosis (ICD10-I70.0). Electronically Signed   By: Telford Nab M.D.   On: 08/29/2022 05:03    Procedures Procedures    Medications Ordered in ED Medications  iohexol (OMNIPAQUE) 350 MG/ML injection 75 mL (75 mLs Intravenous Contrast Given 08/29/22 0424)    ED Course/ Medical Decision Making/ A&P                           Medical Decision Making Amount and/or Complexity of Data Reviewed External Data Reviewed: labs, radiology and notes. Labs: ordered. Decision-making details documented in ED Course. Radiology: ordered and independent interpretation performed. Decision-making details documented in ED Course.   Presents to the emergency department for evaluation of abdominal pain with diarrhea.  Patient's records have been reviewed.  She does have a history of superior mesenteric artery blockage requiring stenting.  Patient does, however, reports that the symptoms she is experiencing currently are different than what she experienced with her SMA problems.  She does describe classic postprandial pain with her previous abdominal problems, this has resolved since the stenting.  Examination reveals very mild diffuse tenderness, no guarding or rebound.  Good bowel sounds are present.  She appears well.  Basic labs are unremarkable.  Normal white blood cell count.  No anemia.  Patient underwent CT angio of abdomen and pelvis to further evaluate.  Prior stenting is widely patent.  She does have some blockage of the inferior mesenteric artery at its origin with good opacity distally, no signs of ischemia at this point.  Patient's abdominal pain and diarrhea is not related to  mesenteric ischemia at this point.  She likely did have simple diarrhea with abdominal pain secondary to the diarrhea.  As she appears well, workup is been reassuring, no further intervention necessary today.  Final Clinical Impression(s) / ED Diagnoses Final diagnoses:  Diarrhea, unspecified type  Generalized abdominal pain    Rx / DC Orders ED Discharge Orders     None         Orpah Greek, MD 08/29/22 [phone]

## 2022-08-30 ENCOUNTER — Ambulatory Visit: Payer: Medicare HMO | Admitting: Family Medicine

## 2022-08-31 ENCOUNTER — Telehealth: Payer: Self-pay | Admitting: *Deleted

## 2022-08-31 NOTE — Patient Outreach (Signed)
  Care Coordination [facility] Note Transition Care Management Follow-up Telephone Call Date of discharge and from where: [facility] [phone] How have you been since you were released from the hospital? I am doing really good Any questions or concerns? Yes Patient had concerns about her 15 hrs wait in ED. RN told her to call patient experience and voice her concerns Items Reviewed: Did the pt receive and understand the discharge instructions provided? Yes  Medications obtained and verified?  No new medications have been ordered Other? No  Any new allergies since your discharge? No  Dietary orders reviewed? No Do you have support at home? Yes   Home Care and Equipment/Supplies: Were home health services ordered? no If so, what is the name of the agency? N/a  Has the agency set up a time to come to the patient's home? not applicable Were any new equipment or medical supplies ordered?  No What is the name of the medical supply agency? N/a  Were you able to get the supplies/equipment? not applicable Do you have any questions related to the use of the equipment or supplies? No  Functional Questionnaire: (I = Independent and D = Dependent) ADLs: I  Bathing/Dressing- I  Meal Prep- I  Eating- I  Maintaining continence- I  Transferring/Ambulation- I  Managing Meds- I  Follow up appointments reviewed:  PCP Hospital f/u appt confirmed? Yes  Per patient Scheduled to see  Dr [facility] f/u appt confirmed? Yes  Cardiology scheduled per patient Are transportation arrangements needed? No  If their condition worsens, is the pt aware to call PCP or go to the Emergency Dept.? Yes Was the patient provided with contact information for the PCP's office or ED? Yes Was to pt encouraged to call back with questions or concerns? Yes  SDOH assessments and interventions completed:   Yes SDOH Interventions Today    Flowsheet Row Most Recent Value  SDOH Interventions   Food Insecurity  Interventions Intervention Not Indicated  Housing Interventions Intervention Not Indicated  Transportation Interventions Intervention Not Indicated       Care Coordination Interventions:  RN did make patient aware that she can call patient experience with her complaints of long waiting time.     Encounter Outcome:  Pt. Visit Completed    [facility] [phone]

## 2022-09-06 ENCOUNTER — Other Ambulatory Visit: Payer: Self-pay | Admitting: Family Medicine

## 2022-09-06 ENCOUNTER — Telehealth: Payer: Self-pay

## 2022-09-06 DIAGNOSIS — E782 Mixed hyperlipidemia: Secondary | ICD-10-CM

## 2022-09-06 NOTE — Telephone Encounter (Signed)
     Patient  visit  Christina Cantrell 12/26  Have you been able to follow up with your primary care physician? Yes next week  The patient was or was not able to obtain any needed medicine or equipment. Yes   Are there diet recommendations that you are having difficulty following? Na   Patient expresses understanding of discharge instructions and education provided has no other needs at this time.  yes    [place], Care Management  [phone] [address], [place], [place] [address] Phone: [phone] Email: [email or web address]

## 2022-09-13 ENCOUNTER — Ambulatory Visit (INDEPENDENT_AMBULATORY_CARE_PROVIDER_SITE_OTHER): Payer: Medicare HMO | Admitting: Family Medicine

## 2022-09-13 ENCOUNTER — Encounter: Payer: Self-pay | Admitting: Family Medicine

## 2022-09-13 VITALS — BP 120/80 | HR 83 | Temp 97.4°F | Resp 15 | Ht 64.0 in | Wt 139.0 lb

## 2022-09-13 DIAGNOSIS — G35 Multiple sclerosis: Secondary | ICD-10-CM | POA: Diagnosis not present

## 2022-09-13 DIAGNOSIS — E538 Deficiency of other specified B group vitamins: Secondary | ICD-10-CM

## 2022-09-13 DIAGNOSIS — T402X5A Adverse effect of other opioids, initial encounter: Secondary | ICD-10-CM

## 2022-09-13 DIAGNOSIS — K219 Gastro-esophageal reflux disease without esophagitis: Secondary | ICD-10-CM

## 2022-09-13 DIAGNOSIS — E7801 Familial hypercholesterolemia: Secondary | ICD-10-CM

## 2022-09-13 DIAGNOSIS — G4731 Primary central sleep apnea: Secondary | ICD-10-CM

## 2022-09-13 DIAGNOSIS — R1010 Upper abdominal pain, unspecified: Secondary | ICD-10-CM | POA: Diagnosis not present

## 2022-09-13 DIAGNOSIS — K5903 Drug induced constipation: Secondary | ICD-10-CM

## 2022-09-13 DIAGNOSIS — E782 Mixed hyperlipidemia: Secondary | ICD-10-CM | POA: Diagnosis not present

## 2022-09-13 HISTORY — DX: Deficiency of other specified B group vitamins: E53.8

## 2022-09-13 HISTORY — DX: Gastro-esophageal reflux disease without esophagitis: K21.9

## 2022-09-13 MED ORDER — CYANOCOBALAMIN 1000 MCG/ML IJ SOLN
1000.0000 ug | Freq: Once | INTRAMUSCULAR | Status: AC
  Administered 2022-09-13: 1000 ug via INTRAMUSCULAR

## 2022-09-13 NOTE — Assessment & Plan Note (Signed)
Previously seen by neurologist, Dr. Erling Cruz.  Refer to neurology although reluctant to take any MS medicines.

## 2022-09-13 NOTE — Progress Notes (Unsigned)
Subjective:  Patient ID: Christina Cantrell, female    DOB: 01-Mar-1961  Age: 62 y.o. MRN: [id number]  No chief complaint on file.   HPI  Patient was seen at [facility] ED on 08/28/2022. She presented abdominal pain, nausea and diarrhea. She had abdominal pain for couple days and it was after she ate fried chicken. She suffered of constipation due to pain medicine for MS neuropathy. She went to ED concerned to have a Mesenteric ischemia because she had one blockage of her superior mesenteric artery earlier this year that was stented by interventional radiology.  CT Angio abd/pel w and wo contrast. IMPRESSION: 1. Aortic and branch vessel atherosclerosis, with aortoiliac mixed plaque and 40% distal aortic soft plaque stenosis. 2. Interval stenting of the proximal SMA with no flow-limiting stenosis. 3. 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 Remainder of the vessel opacifies well. 4. 40-50% stenosis of both common iliac arteries and up to 50% irregular stenosis of both internal iliac arteries. 5. No bowel wall thickening or mesenteric edema to suggest mesenteric ischemia. 6. Constipation and diverticulosis. 7. 1.6 x 1.3 cm fat density lesion in the anterior pancreatic head, unchanged since 02/03/2022 but increased in size since 2017. This could be a lipoma or a low-grade liposarcoma. 8. Hepatic steatosis with stable 1.5 cm cyst in the left lobe and additional subcentimeter hypodensities which are too small to characterize. Small hemangioma as well. 9. 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  They diagnosed her with diarrhea, unspecified type and generalized abdominal pain.  She mentioned to started eating and she mentioned some acid reflux.  Current Outpatient Medications on File Prior to Visit  Medication Sig Dispense Refill   albuterol (VENTOLIN HFA) 108 (90 Base) MCG/ACT inhaler  Inhale 2 puffs into the lungs every 6 (six) hours as needed for wheezing or shortness of breath.     aspirin EC 81 MG tablet Take 81 mg by mouth daily. Swallow whole.     citalopram (CELEXA) 40 MG tablet Take 40 mg by mouth daily.     clopidogrel (PLAVIX) 75 MG tablet Take 1 tablet (75 mg total) by mouth daily. 90 tablet 1   cyanocobalamin (VITAMIN B12) 1000 MCG/ML injection Inject 1 mL (1,000 mcg total) into the muscle every 30 (thirty) days. 3 mL 1   cyclobenzaprine (FLEXERIL) 10 MG tablet Take 10 mg by mouth 3 (three) times daily as needed for muscle spasms.     doxycycline (PERIOSTAT) 20 MG tablet Take 20 mg by mouth 2 (two) times daily.     fentaNYL (DURAGESIC) 75 MCG/HR Place 1 patch onto the skin 2 days.     ketoconazole (NIZORAL) 2 % shampoo Apply 1 Application topically daily as needed for irritation.     Loteprednol Etabonate 0.5 % GEL Place 1 drop into both eyes daily as needed (optic neuritis).     metroNIDAZOLE (METROCREAM) 0.75 % cream Apply 1 Application topically 2 (two) times daily.     modafinil (PROVIGIL) 100 MG tablet Take 200 mg by mouth daily as needed (narcolepsy).     norethindrone-ethinyl estradiol (FYAVOLV) 1-5 MG-MCG TABS tablet Take 1 tablet by mouth daily.     omeprazole (PRILOSEC) 40 MG capsule TAKE 1 CAPSULE (40 MG TOTAL) BY MOUTH DAILY. 90 capsule 1   oxyCODONE-acetaminophen (PERCOCET) 10-325 MG tablet Take 1 tablet by mouth every 4 (four) hours as needed for pain.     Polyethyl Glycol-Propyl  Glycol (SYSTANE OP) Place 1 drop into both eyes daily as needed (dry eyes).     RELISTOR 150 MG TABS Take 3 tablets by mouth every morning.     rosuvastatin (CRESTOR) 20 MG tablet Take 20 mg by mouth daily.     Vitamin D, Ergocalciferol, (DRISDOL) 1.25 MG (50000 UNIT) CAPS capsule TAKE 1 CAPSULE (50,000 UNITS TOTAL) BY MOUTH TWO TIMES A WEEK 12 capsule 0   nitroGLYCERIN (NITROSTAT) 0.4 MG SL tablet Place 1 tablet (0.4 mg total) under the tongue every 5 (five) minutes as needed.  25 tablet 6   No current facility-administered medications on file prior to visit.   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   Centrilobular emphysema ([place]) 10/04/2021   Cervical radiculopathy 07/21/2021   Chronic low back pain 06/04/2013   Degeneration of lumbar intervertebral disc 06/04/2013   Depression 09/16/2020   Epigastric pain 10/04/2021   Esophageal dysphagia 02/13/2022   Fatigue 09/16/2020   Flank pain 10/04/2021   Gastroesophageal reflux disease 02/13/2022   Left lower quadrant abdominal pain 12/29/2021   Leg edema, left 12/29/2021   Localized osteoporosis without current pathological fracture 06/27/2021   Mixed hyperlipidemia 06/27/2021   Multiple sclerosis ([place]) 1991   Age 70 yo   Neck pain 09/12/2021   Neuropathic pain 11/12/2017   Optic neuritis 09/17/2018   Osteoarthritis 09/17/2018   Osteopenia 11/20/2018   Other chest pain 02/18/2022   Pain in thoracic spine 06/04/2013   Paresthesia 02/13/2022   Polyuria 02/18/2022   Screening for lung cancer 12/29/2021   Spinal stenosis 09/17/2018   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 No pertinent family history. Social History   Socioeconomic History   Marital status: Married    Spouse name: Not on file   Number of children: Not on file   Years of education: Not on file   Highest education  level: Not on file  Occupational History   Not on file  Tobacco Use   Smoking status: Former    Years: 20.00    Types: Cigarettes    Quit date: 2018    Years since quitting: 6.0   Smokeless tobacco: Never  Substance and Sexual Activity   Alcohol use: Never   Drug use: Never   Sexual activity: Not on file  Other Topics Concern   Not on file  Social History Narrative   Not on file   Social Determinants of Health   Financial Resource Strain: Not on file  Food Insecurity: No Food Insecurity (08/31/2022)   Hunger Vital Sign    Worried About Running Out of Food in the Last Year: Never true    Ran Out of Food in the Last Year: Never true  Transportation Needs: No Transportation Needs (08/31/2022)   PRAPARE - Transportation    Lack of  Transportation (Medical): No    Lack of Transportation (Non-Medical): No  Physical Activity: Not on file  Stress: Not on file  Social Connections: Not on file    Review of Systems  Constitutional:  Negative for chills, fatigue and fever.  HENT:  Negative for congestion, ear pain and sore throat.   Respiratory:  Positive for shortness of breath. Negative for cough.   Cardiovascular:  Negative for chest pain and palpitations.  Gastrointestinal:  Positive for abdominal pain and nausea. Negative for constipation, diarrhea and vomiting.  Endocrine: Negative for polydipsia, polyphagia and polyuria.  Genitourinary:  Negative for difficulty urinating and dysuria.  Musculoskeletal:  Negative for arthralgias, back pain and myalgias.  Skin:  Negative for rash.  Neurological:  Negative for headaches.  Psychiatric/Behavioral:  Negative for dysphoric mood. The patient is not nervous/anxious.      Objective:  BP 120/80   Pulse 83   Temp (!) 97.4 F (36.3 C)   Resp 15   Ht '5\' 4"'$  (1.626 m)   Wt 139 lb (63 kg)   SpO2 97%   BMI 23.86 kg/m      09/13/2022   11:03 AM 08/29/2022    6:48 AM 08/29/2022    5:28 AM  BP/Weight  Systolic BP 465 681 275   Diastolic BP 80 89 61  Wt. (Lbs) 139    BMI 23.86 kg/m2      Physical Exam  Diabetic Foot Exam - Simple   No data filed      Lab Results  Component Value Date   WBC 6.6 08/28/2022   HGB 16.0 (H) 08/28/2022   HCT 47.0 (H) 08/28/2022   PLT 339 08/28/2022   GLUCOSE 127 (H) 08/28/2022   CHOL 163 05/18/2022   TRIG 141 05/18/2022   HDL 59 05/18/2022   LDLCALC 80 05/18/2022   ALT 10 08/28/2022   AST 18 08/28/2022   NA 137 08/28/2022   K 3.8 08/28/2022   CL 101 08/28/2022   CREATININE 1.00 08/28/2022   BUN 11 08/28/2022   CO2 24 08/28/2022   TSH 2.610 02/13/2022   INR 1.0 03/02/2022      Assessment & Plan:   Central sleep apnea syndrome Referral to sleep specialist.  Needs repeat sleep study and equipment.  Multiple sclerosis ([place]) Previously seen by neurologist, Dr. Erling Cruz.  Refer to neurology although reluctant to take any MS medicines.      Problem List Items Addressed This Visit       Digestive   Gastroesophageal reflux disease   Relevant Medications   RELISTOR 150 MG TABS     Nervous and Auditory   Multiple sclerosis ([place])    Previously seen by neurologist, Dr. Erling Cruz.  Refer to neurology although reluctant to take any MS medicines.          Other   Mixed hyperlipidemia   Relevant Medications   rosuvastatin (CRESTOR) 20 MG tablet   Other Relevant Orders   Comprehensive metabolic panel   Lipid panel   Central sleep apnea syndrome    Referral to sleep specialist.  Needs repeat sleep study and equipment.      Other Visit Diagnoses     Therapeutic opioid induced constipation    -  Primary   Relevant Orders   Ambulatory referral to Gastroenterology   Pain of upper abdomen       Relevant Orders   Ambulatory referral to Gastroenterology   B12 deficiency       Relevant Orders  Vitamin B12     .  No orders of the defined types were placed in this encounter.   Orders Placed This Encounter  Procedures   Comprehensive metabolic panel    Lipid panel   Vitamin B12   Ambulatory referral to Gastroenterology     Follow-up: No follow-ups on file.  An After Visit Summary was printed and given to the patient.  Geralynn Ochs I Leal-Borjas,acting as a scribe for Rochel Brome, MD.,have documented all relevant documentation on the behalf of Rochel Brome, MD,as directed by  Rochel Brome, MD while in the presence of Rochel Brome, MD.    Rochel Brome, MD [place] [phone]

## 2022-09-13 NOTE — Assessment & Plan Note (Signed)
Referral to sleep specialist.  Needs repeat sleep study and equipment.

## 2022-09-14 LAB — COMPREHENSIVE METABOLIC PANEL
ALT: 8 IU/L (ref 0–32)
AST: 15 IU/L (ref 0–40)
Albumin/Globulin Ratio: 2 (ref 1.2–2.2)
Albumin: 4.3 g/dL (ref 3.8–4.9)
Alkaline Phosphatase: 60 IU/L (ref 44–121)
BUN/Creatinine Ratio: 11 (ref 9–23)
BUN: 11 mg/dL (ref 6–24)
Bilirubin Total: 0.4 mg/dL (ref 0.0–1.2)
CO2: 23 mmol/L (ref 20–29)
Calcium: 9.2 mg/dL (ref 8.7–10.2)
Chloride: 101 mmol/L (ref 96–106)
Creatinine, Ser: 0.98 mg/dL (ref 0.57–1.00)
Globulin, Total: 2.1 g/dL (ref 1.5–4.5)
Glucose: 68 mg/dL — ABNORMAL LOW (ref 70–99)
Potassium: 4.1 mmol/L (ref 3.5–5.2)
Sodium: 137 mmol/L (ref 134–144)
Total Protein: 6.4 g/dL (ref 6.0–8.5)
eGFR: 66 mL/min/{1.73_m2} (ref >59)

## 2022-09-14 LAB — LIPID PANEL
Chol/HDL Ratio: 2.6 ratio (ref 0.0–4.4)
Cholesterol, Total: 165 mg/dL (ref 100–199)
HDL: 63 mg/dL (ref >39)
LDL Chol Calc (NIH): 84 mg/dL (ref 0–99)
Triglycerides: 99 mg/dL (ref 0–149)
VLDL Cholesterol Cal: 18 mg/dL (ref 5–40)

## 2022-09-14 LAB — VITAMIN B12: Vitamin B-12: 351 pg/mL (ref 232–1245)

## 2022-09-14 LAB — CARDIOVASCULAR RISK ASSESSMENT

## 2022-09-16 NOTE — Assessment & Plan Note (Signed)
>>  ASSESSMENT AND PLAN FOR B12 DEFICIENCY WRITTEN ON 09/16/2022  9:30 PM BY LEAL-BORJAS, Phillippe Orlick I, CMA  Check labs

## 2022-09-16 NOTE — Assessment & Plan Note (Signed)
The current medical regimen is effective;  continue present plan and medications.  Omeprazole 40 mg daily.

## 2022-09-16 NOTE — Assessment & Plan Note (Signed)
Referral to GI  

## 2022-09-16 NOTE — Assessment & Plan Note (Signed)
Check labs 

## 2022-09-18 NOTE — Assessment & Plan Note (Signed)
Not related to mesenteric ischemia.  Recommend refer to GI as it is likely due to constipation.

## 2022-09-18 NOTE — Assessment & Plan Note (Signed)
>>  ASSESSMENT AND PLAN FOR FAMILIAL HYPERLIPIDEMIA WRITTEN ON 09/18/2022 11:26 PM BY Bobbie Valletta, MD  Well controlled.  No changes to medicines.  Continue to work on eating a healthy diet and exercise.  Labs drawn today.

## 2022-09-18 NOTE — Assessment & Plan Note (Signed)
Well controlled.  ?No changes to medicines.  ?Continue to work on eating a healthy diet and exercise.  ?Labs drawn today.  ?

## 2022-10-05 ENCOUNTER — Encounter: Payer: Self-pay | Admitting: Family Medicine

## 2022-10-11 DIAGNOSIS — F1120 Opioid dependence, uncomplicated: Secondary | ICD-10-CM | POA: Diagnosis not present

## 2022-10-11 DIAGNOSIS — G35 Multiple sclerosis: Secondary | ICD-10-CM | POA: Diagnosis not present

## 2022-10-11 DIAGNOSIS — M792 Neuralgia and neuritis, unspecified: Secondary | ICD-10-CM | POA: Diagnosis not present

## 2022-10-21 LAB — HM DEXA SCAN

## 2022-10-25 ENCOUNTER — Other Ambulatory Visit: Payer: Self-pay | Admitting: Family Medicine

## 2022-10-30 ENCOUNTER — Ambulatory Visit (INDEPENDENT_AMBULATORY_CARE_PROVIDER_SITE_OTHER): Payer: Medicare HMO | Admitting: Neurology

## 2022-10-30 ENCOUNTER — Telehealth: Payer: Self-pay | Admitting: Neurology

## 2022-10-30 ENCOUNTER — Encounter: Payer: Self-pay | Admitting: Neurology

## 2022-10-30 VITALS — BP 119/75 | HR 83 | Ht 64.0 in | Wt 134.6 lb

## 2022-10-30 DIAGNOSIS — Z79899 Other long term (current) drug therapy: Secondary | ICD-10-CM

## 2022-10-30 DIAGNOSIS — G4731 Primary central sleep apnea: Secondary | ICD-10-CM | POA: Diagnosis not present

## 2022-10-30 HISTORY — DX: Other long term (current) drug therapy: Z79.899

## 2022-10-30 NOTE — Progress Notes (Signed)
SLEEP MEDICINE CLINIC    Provider:  Larey Seat, MD  Primary Care Physician:  Rochel Brome, MD [address] [place] [address]     Referring Provider: Rochel Brome, Md [address] [place],  [place] [address]          Chief Complaint according to patient   Patient presents with:     New Patient (Initial Visit)     This MS patient has had 4 sleep studies with Dr Carren Rang in [place]. Chronic pain and chronically on narcotic pain meds, has neurologist, pulmonologist and pain treatment.        HISTORY OF PRESENT ILLNESS:   10-30-2022:  Christina Cantrell is a 62 y.o. female patient who is seen upon referral on 10/30/2022 from Dr Cox who reports a history of central sleep apnea , originally ON ASV, and this did not work for her - (because she is on fentanyl and this indices her central apneas- ASV cannot overpower opiate induced central apnea).  She quit ASV use almost a decade ago.  She has been in a wheelchair and the fentanyl allowed her to walk again, she states.  She is in contact with her [facility] pain specialists and is interested in reducing the dose gradually . she reports sleeping in a recliner, myoclonic jerks from the spine are still present. We discussed the idea behind a new sleep study- and I see no need to confirm a central apnea condition- that is the most common sleep disorder in patients treated on Fentanyl- on narcotics.   ASV cannot overcome , override a central apnea relate to a sedated brain.    Christina Cantrell is also a MS patient. Pain patient .  90% superior mesenteric artery blockage, CAD.        Review of Systems: Out of a complete 14 system review, the patient complains of only the following symptoms, and all other reviewed systems are negative.:  Fatigue, sleepiness , snoring, fragmented sleep, Insomnia, RLS, Nocturia.    How likely are you to doze in the following situations: 0 = not likely, 1 = slight chance, 2 =  moderate chance, 3 = high chance   Sitting and Reading? Watching Television? Sitting inactive in a public place (theater or meeting)? As a passenger in a car for an hour without a break? Lying down in the afternoon when circumstances permit? Sitting and talking to someone? Sitting quietly after lunch without alcohol? In a car, while stopped for a few minutes in traffic?   Total = 9/ 24 points   FSS endorsed at 62/ 63 points.   Chronic pain.  I do not treat  MS:   Social History   Socioeconomic History   Marital status: Married    Spouse name: Not on file   Number of children: Not on file   Years of education: Not on file   Highest education level: Not on file  Occupational History   Not on file  Tobacco Use   Smoking status: Former    Years: 20.00    Types: Cigarettes    Quit date: 2018    Years since quitting: 6.1   Smokeless tobacco: Never  Substance and Sexual Activity   Alcohol use: Never   Drug use: Never   Sexual activity: Not on file  Other Topics Concern   Not on file  Social History Narrative   Not on file   Social Determinants of  Health   Financial Resource Strain: Not on file  Food Insecurity: No Food Insecurity (08/31/2022)   Hunger Vital Sign    Worried About Running Out of Food in the Last Year: Never true    Ran Out of Food in the Last Year: Never true  Transportation Needs: No Transportation Needs (08/31/2022)   PRAPARE - Hydrologist (Medical): No    Lack of Transportation (Non-Medical): No  Physical Activity: Not on file  Stress: Not on file  Social Connections: Not on file    History reviewed. No pertinent family history.  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   Centrilobular emphysema ([place]) 10/04/2021   Cervical radiculopathy 07/21/2021   Chronic low back pain 06/04/2013   Degeneration of lumbar intervertebral disc 06/04/2013   Depression 09/16/2020   Epigastric  pain 10/04/2021   Esophageal dysphagia 02/13/2022   Fatigue 09/16/2020   Flank pain 10/04/2021   Gastroesophageal reflux disease 02/13/2022   Left lower quadrant abdominal pain 12/29/2021   Leg edema, left 12/29/2021   Localized osteoporosis without current pathological fracture 06/27/2021   Mixed hyperlipidemia 06/27/2021   Multiple sclerosis ([place]) 1991   Age 18 yo   Neck pain 09/12/2021   Neuropathic pain 11/12/2017   Optic neuritis 09/17/2018   Osteoarthritis 09/17/2018   Osteopenia 11/20/2018   Other chest pain 02/18/2022   Pain in thoracic spine 06/04/2013   Paresthesia 02/13/2022   Polyuria 02/18/2022   Screening for lung cancer 12/29/2021   Spinal stenosis 09/17/2018   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   Therapeutic opioid-induced constipation (OIC) 99991111   Uncomplicated opioid dependence ([place]) 09/12/2021   Vitamin D deficiency 06/27/2021    Past Surgical History:  Procedure Laterality Date   BREAST BIOPSY Bilateral 08/24/2020   BREAST EXCISIONAL BIOPSY Right 1975   IR ANGIOGRAM VISCERAL SELECTIVE  03/02/2022   IR RADIOLOGIST EVAL & MGMT  02/21/2022   IR RADIOLOGIST EVAL & MGMT  04/03/2022   IR RADIOLOGIST EVAL & MGMT  06/12/2022   IR TRANSCATH PLC STENT 1ST ART NOT LE CV CAR VERT CAR  03/02/2022   IR US GUIDE VASC ACCESS RIGHT  03/02/2022     Current Outpatient Medications on File Prior to Visit  Medication Sig Dispense Refill   albuterol (VENTOLIN HFA) 108 (90 Base) MCG/ACT inhaler Inhale 2 puffs into the lungs every 6 (six) hours as needed for wheezing or shortness of breath.     aspirin EC 81 MG tablet Take 81 mg by mouth daily. Swallow whole.     citalopram (CELEXA) 40 MG tablet Take 40 mg by mouth daily.     clopidogrel (PLAVIX) 75 MG tablet Take 1 tablet (75 mg total) by mouth daily. 90 tablet 1   cyanocobalamin (VITAMIN B12) 1000 MCG/ML injection INJECT 1 ML (1,000 MCG TOTAL) INTO THE MUSCLE EVERY 30 DAYS. 3 mL 1    cyclobenzaprine (FLEXERIL) 10 MG tablet Take 10 mg by mouth 3 (three) times daily as needed for muscle spasms.     doxycycline (PERIOSTAT) 20 MG tablet Take 20 mg by mouth 2 (two) times daily.     fentaNYL (DURAGESIC) 75 MCG/HR Place 1 patch onto the skin 2 days.     ketoconazole (NIZORAL) 2 % shampoo Apply 1 Application topically daily as needed for irritation.     Loteprednol Etabonate 0.5 % GEL Place 1 drop into both eyes daily as needed (optic neuritis).  metroNIDAZOLE (METROCREAM) 0.75 % cream Apply 1 Application topically 2 (two) times daily.     modafinil (PROVIGIL) 100 MG tablet Take 200 mg by mouth daily as needed (narcolepsy).     norethindrone-ethinyl estradiol (FYAVOLV) 1-5 MG-MCG TABS tablet Take 1 tablet by mouth daily.     omeprazole (PRILOSEC) 40 MG capsule TAKE 1 CAPSULE (40 MG TOTAL) BY MOUTH DAILY. 90 capsule 1   oxyCODONE-acetaminophen (PERCOCET) 10-325 MG tablet Take 1 tablet by mouth every 4 (four) hours as needed for pain.     Polyethyl Glycol-Propyl Glycol (SYSTANE OP) Place 1 drop into both eyes daily as needed (dry eyes).     RELISTOR 150 MG TABS Take 3 tablets by mouth every morning.     rosuvastatin (CRESTOR) 20 MG tablet Take 20 mg by mouth daily.     Vitamin D, Ergocalciferol, (DRISDOL) 1.25 MG (50000 UNIT) CAPS capsule TAKE 1 CAPSULE (50,000 UNITS TOTAL) BY MOUTH TWO TIMES A WEEK 12 capsule 0   nitroGLYCERIN (NITROSTAT) 0.4 MG SL tablet Place 1 tablet (0.4 mg total) under the tongue every 5 (five) minutes as needed. 25 tablet 6   No current facility-administered medications on file prior to visit.    Allergies  Allergen Reactions   Sertraline Hcl Hives     DIAGNOSTIC DATA (LABS, IMAGING, TESTING) - I reviewed patient records, labs, notes, testing and imaging myself where available.  Lab Results  Component Value Date   WBC 6.6 08/28/2022   HGB 16.0 (H) 08/28/2022   HCT 47.0 (H) 08/28/2022   MCV 91.8 08/28/2022   PLT 339 08/28/2022      Component  Value Date/Time   NA 137 09/13/2022 1215   K 4.1 09/13/2022 1215   CL 101 09/13/2022 1215   CO2 23 09/13/2022 1215   GLUCOSE 68 (L) 09/13/2022 1215   GLUCOSE 127 (H) 08/28/2022 1712   BUN 11 09/13/2022 1215   CREATININE 0.98 09/13/2022 1215   CALCIUM 9.2 09/13/2022 1215   PROT 6.4 09/13/2022 1215   ALBUMIN 4.3 09/13/2022 1215   AST 15 09/13/2022 1215   ALT 8 09/13/2022 1215   ALKPHOS 60 09/13/2022 1215   BILITOT 0.4 09/13/2022 1215   GFRNONAA >60 08/28/2022 1704   GFRAA 93 09/16/2020 1027   Lab Results  Component Value Date   CHOL 165 09/13/2022   HDL 63 09/13/2022   LDLCALC 84 09/13/2022   TRIG 99 09/13/2022   CHOLHDL 2.6 09/13/2022   No results found for: "HGBA1C" Lab Results  Component Value Date   VITAMINB12 351 09/13/2022   Lab Results  Component Value Date   TSH 2.610 02/13/2022    PHYSICAL EXAM:  Today's Vitals   10/30/22 1517  BP: 119/75  Pulse: 83  Weight: 134 lb 9.6 oz (61.1 kg)  Height: '5\' 4"'$  (1.626 m)   Body mass index is 23.1 kg/m.   Wt Readings from Last 3 Encounters:  10/30/22 134 lb 9.6 oz (61.1 kg)  09/13/22 139 lb (63 kg)  08/28/22 130 lb (59 kg)     Ht Readings from Last 3 Encounters:  10/30/22 '5\' 4"'$  (1.626 m)  09/13/22 '5\' 4"'$  (1.626 m)  08/28/22 '5\' 4"'$  (1.626 m)      General: The patient is awake, alert and appears not in acute distress. The patient is well groomed. Head: Normocephalic, atraumatic. Neck is supple. Mallampati 1,  neck circumference:14 inches . Nasal airflow  patent.  Retrognathia is  seen.  Dental status: crowded.  Cardiovascular:  Regular rate and  cardiac rhythm by pulse,  without distended neck veins. Respiratory: Lungs are clear to auscultation.  Skin:  Without evidence of ankle edema, or rash. Trunk: The patient's posture is erect.   NEUROLOGIC EXAM: The patient is awake and alert, oriented to place and time.   Memory subjective described as intact.  Attention span & concentration ability appears normal.   Speech is fluent,  without  dysarthria, dysphonia or aphasia.  Mood and affect are appropriate.   Cranial nerves: no loss of smell or taste reported  Pupils are equal and briskly reactive to light. Round and equal in size. Funduscopic exam deferred. .  Extraocular movements in vertical and horizontal planes were intact and without nystagmus. No Diplopia. Visual fields by finger perimetry are intact. Hearing was intact to soft voice and finger rubbing.    Facial sensation intact to fine touch.  Facial motor strength is symmetric and tongue and uvula move midline.  Neck ROM : rotation, tilt and flexion extension were normal for age and shoulder shrug was symmetrical.    Motor exam:  Symmetric bulk, tone and ROM.   Normal tone without cog wheeling, symmetrically reduced  grip strength .   Sensory:  Fine touch, and vibration were reduced in finger tips and feet.  Proprioception tested in the upper extremities was normal.      ASSESSMENT AND PLAN 62 y.o. year old female  here with:    1)This patient had 4  sleep studies with Dr Carren Rang in [place]-  history of central apnea but the cause of central apnea is her pain management on fentanyl- the strongest central apnea inducing pain medication is Fentanyl.   2) I can repeat a sleep study- but central apnea of fentanyl cannot respond to ASV therapy. She needs to work with her pain MD and pulmonologist to get to lower doses and can be followed by pulmonology for sleep.   3) I don't treat MS, her MS doctor has retired almost 10 years ago and she is not on treatment anyway- .   I do not plan to follow up .   I would like to thank Rochel Brome, MD for allowing me to meet with and to take care of this pleasant patient.     After spending a total time of  23  minutes face to face and additional time for physical and neurologic examination, review of laboratory studies,  personal review of imaging studies, reports and results of other testing  and review of referral information / records as far as provided in visit,   Electronically signed by: Larey Seat, MD 10/30/2022 3:40 PM  [facility] and [facility] Sleep Board certified by The [employer] of Sleep Medicine and Diplomate of the [employer] of Sleep Medicine. Board certified In Neurology through the [place], Fellow of the [employer] of Neurology. Medical Director of [employer].

## 2022-10-30 NOTE — Patient Instructions (Signed)
Fentanyl Patches What is this medication? FENTANYL (FEN ta nil) treats severe, chronic pain. It is prescribed when other pain medications do not work well enough or cannot be tolerated. It works by blocking pain signals in the brain. It belongs to a group of medication called opioids. It is a long-acting pain medication. Do not use it to treat sudden pain. This medicine may be used for other purposes; ask your health care provider or pharmacist if you have questions. COMMON BRAND NAME(S): Duragesic What should I tell my care team before I take this medication? They need to know if you have any of these conditions: Brain tumor Drug abuse or addiction Head injury Heart disease If you often drink alcohol Kidney disease Liver disease Lung disease, asthma, or breathing problem Seizures Stomach or intestine problems Taken an MAOI such as Marplan, Nardil, or Parnate in the last 14 days An unusual or allergic reaction to fentanyl, other medications, foods, dyes, or preservatives Pregnant or trying to get pregnant Breast-feeding How should I use this medication? This medication is for external use only. Apply the patch to your skin. Select a clean, dry area of skin above your waist on your front or back. The upper back is a good spot to put the patch on children or people who are confused because it will be hard for them to remove the patch. Do not apply the patch to oily, broken, burned, cut, or irritated skin. Use only water to clean the area. Do not use soap or alcohol to clean the skin because this can increase the effects of the medication. If the area is hairy, clip the hair with scissors, but do not shave. Do not cut or damage the patch. A cut or damaged patch can be very dangerous because you may get too much medication. Take the patch out of its wrapper, and take off the protective strip over the sticky part. Do not use a patch if the packaging or backing is damaged. Do not touch the sticky  part with your fingers. Press the sticky surface to the skin using the palm of your hand. Press the patch to the skin for 30 seconds. Wash your hands at once with soap and water. Keep patches far away from children. Do not let children see you apply the patch and do not apply it where children can see it. Do not call the patch a sticker, tattoo, or bandage, as this could encourage the child to mimic your actions. Used patches still contain medication. Children or pets can have serious side effects or die from putting used patches in their mouth or on their bodies. Take off the old patch before putting on a new patch. Apply each new patch to a different area of skin. If a patch comes off or causes irritation, remove it and apply a new patch to different site. If the edges of the patch start to loosen, apply first aid tape to the edges of the patch. If problems with the patch not sticking continue, cover the patch with a see-through adhesive dressing (like Bioclusive or Tegaderm). Never cover the patch with any other bandage or tape. A special MedGuide will be given to you by the pharmacist with each prescription and refill. Be sure to read this information carefully each time. Talk to your care team regarding the use of this medication in children. While this medication may be prescribed for children as young as 11 years old for selected conditions, precautions do apply. Overdosage: If  you think you have taken too much of this medicine contact a poison control center or emergency room at once. NOTE: This medicine is only for you. Do not share this medicine with others. What if I miss a dose? If you miss a dose, use it as soon as you can. If it is almost time for your next dose, use only that dose. Do not use double or extra doses. What may interact with this medication? Do not take this medication with any of the following: Mifepristone Safinamide Samidorphan This medication may also interact with the  following: Alcohol Antihistamines for allergy, cough, and cold Atropine Certain antibiotics like clarithromycin, erythromycin, linezolid, rifampin Certain antivirals for HIV or hepatitis Certain medications for anxiety or sleep Certain medications for bladder problems like oxybutynin, tolterodine Certain medications for depression like amitriptyline, fluoxetine, sertraline, mirtazapine, trazodone Certain medications for fungal infections like ketoconazole, itraconazole, posaconazole Certain medications for migraine headache like almotriptan, eletriptan, frovatriptan, naratriptan, rizatriptan, sumatriptan, zolmitriptan Certain medications for nausea or vomiting like dolasetron, granisetron, ondansetron, palonosetron Certain medications for Parkinson's disease like benztropine, trihexyphenidyl Certain medications for seizures like carbamazepine, phenobarbital, phenytoin, primidone Certain medications for stomach problems like dicyclomine, hyoscyamine Certain medications for travel sickness like scopolamine Diuretics General anesthetics like halothane, isoflurane, methoxyflurane, propofol Ipratropium MAOIs like Marplan, Nardil, and Parnate Medications that relax muscles Methylene blue Other narcotic medications for pain or cough Phenothiazines like chlorpromazine, mesoridazine, prochlorperazine, thioridazine This list may not describe all possible interactions. Give your health care provider a list of all the medicines, herbs, non-prescription drugs, or dietary supplements you use. Also tell them if you smoke, drink alcohol, or use illegal drugs. Some items may interact with your medicine. What should I watch for while using this medication? Tell your care team if your pain does not go away, if it gets worse, or if you have new or a different type of pain. You may develop tolerance to this medication. Tolerance means that you will need a higher dose of the medication for pain relief. Tolerance  is normal and is expected if you take this medication for a long time. Taking this medication with other substances that cause drowsiness, such as alcohol, benzodiazepines, or other opioids can cause serious side effects. Give your care team a list of all medications you use. They will tell you how much medication to take. Do not take more medication than directed. Call emergency services if you have problems breathing or staying awake. Children may be at higher risk for side effects. Stop giving this medication and call emergency services right away if your child has slow or noisy breathing, has confusion, is unusually sleepy, or not able to wake up. Long term use of this medication may cause your brain and body to depend on it. This can happen even when used as directed by your care team. You and your care team will work together to determine how long you will need to take this medication. If your care team wants you to stop this medication, the dose will be slowly lowered over time to reduce the risk of side effects. Naloxone is an emergency medication used for an opioid overdose. An overdose can happen if you take too much of an opioid. It can also happen if an opioid is taken with some other medications or substances such as alcohol. Know the symptoms of an overdose, such as trouble breathing, unusually tired or sleepy, or not being able to respond or wake up. Make sure to tell caregivers  and close contacts where your naloxone is stored. Make sure they know how to use it. After naloxone is given, the person giving it must call emergency services. Naloxone is a temporary treatment. Repeat doses may be needed. This medication may affect your coordination, reaction time, or judgment. Do not drive or operate machinery until you know how this medication affects you. Sit up or stand slowly to reduce the risk of dizzy or fainting spells. Drinking alcohol with this medication can increase the risk of these side  effects. This patch is sensitive to body heat changes. If your skin gets too hot, more medication will come out of the patch and can cause an overdose. Call your care team if you get a fever. Do not take hot baths. Do not sunbathe. Do not use hot tubs, saunas, hairdryers, heating pads, electric blankets, heated waterbeds, or tanning lamps. Do not engage in exercise that increases your body temperature. If you are going to need surgery, an MRI, CT scan, or other procedure, tell your care team that you are using this medication. You may need to remove the patch before the procedure. This medication will cause constipation. If you do not have a bowel movement for 3 days, call your care team. Your mouth may get dry. Chewing sugarless gum or sucking hard candy and drinking plenty of water may help. Contact your care team if the problem does not go away or is severe. Talk to your care team if you are pregnant or think you might be pregnant. This medication can cause serious birth defects if taken during pregnancy. Prolonged use of this medication during pregnancy can cause withdrawal in a newborn. Talk to your care team before breastfeeding. Changes to your treatment plan may be needed. If you breastfeed while taking this medication, seek medical care right away if you notice the child has slow or noisy breathing, is unusually sleepy or not able to wake up, or is limp. Long-term use of this medication may cause infertility. Talk to your care team if you are concerned about your fertility. What side effects may I notice from receiving this medication? Side effects that you should report to your care team as soon as possible: Allergic reactions--skin rash, itching, hives, swelling of the face, lips, tongue, or throat CNS depression--slow or shallow breathing, shortness of breath, feeling faint, dizziness, confusion, trouble staying awake Low adrenal gland function--nausea, vomiting, loss of appetite, unusual  weakness or fatigue, dizziness Low blood pressure--dizziness, feeling faint or lightheaded, blurry vision Side effects that usually do not require medical attention (report to your care team if they continue or are bothersome): Constipation Dizziness Drowsiness Dry mouth Headache Nausea Vomiting This list may not describe all possible side effects. Call your doctor for medical advice about side effects. You may report side effects to FDA at 1-800-FDA-1088. Where should I keep my medication? Keep out of the reach of children and pets. This medication can be abused. Keep it in a safe place to protect it from theft. Do not share it with anyone. It is only for you. Selling or giving away this medication is dangerous and against the law. Store at room temperature between 20 and 25 degrees C (68 and 77 degrees F). Keep this medication in the original packaging until you are ready to take it. Get rid of any unused medication after the expiration date. This medication may cause harm and death if it is taken by other adults, children, or pets. It is important to get  rid of the medication as soon as you no longer need it, or it is expired. You can do this in two ways: Take the medication to a medication take-back program. Check with your pharmacy or law enforcement to find a location. If you cannot return the medication, flush it down the toilet. NOTE: This sheet is a summary. It may not cover all possible information. If you have questions about this medicine, talk to your doctor, pharmacist, or health care provider.  2023 [employer] Standard (2020-08-25 00:00:00)

## 2022-10-30 NOTE — Telephone Encounter (Signed)
Patient referral for Sleep clinic in neurology -   Opioid induced constipation  Pain of upper abdomen  Central sleep apnea syndrome, opiate mediated  Multiple sclerosis (HCC)  COPD.   This constellation should be followed by a pulmonologist for sleep , especially if sleep apnea and COPD are both present. The patient uses fentanyl patches, which can explain central apnea.  The patient has a neurologist for MS and receives pain treatment as well, but this person may not treat sleep disorders.  Please send to a pulmonology sleep specialist.   Larey Seat, MD

## 2022-11-14 ENCOUNTER — Other Ambulatory Visit: Payer: Self-pay | Admitting: Family Medicine

## 2022-11-14 DIAGNOSIS — E7801 Familial hypercholesterolemia: Secondary | ICD-10-CM

## 2022-11-16 ENCOUNTER — Other Ambulatory Visit: Payer: Self-pay | Admitting: Family Medicine

## 2022-11-16 DIAGNOSIS — K219 Gastro-esophageal reflux disease without esophagitis: Secondary | ICD-10-CM

## 2022-12-18 ENCOUNTER — Encounter: Payer: Self-pay | Admitting: Family Medicine

## 2022-12-18 ENCOUNTER — Ambulatory Visit (INDEPENDENT_AMBULATORY_CARE_PROVIDER_SITE_OTHER): Payer: Medicare HMO | Admitting: Family Medicine

## 2022-12-18 ENCOUNTER — Telehealth: Payer: Self-pay

## 2022-12-18 VITALS — BP 128/76 | HR 85 | Temp 98.1°F | Ht 64.0 in | Wt 134.0 lb

## 2022-12-18 DIAGNOSIS — M5412 Radiculopathy, cervical region: Secondary | ICD-10-CM | POA: Diagnosis not present

## 2022-12-18 DIAGNOSIS — T402X5A Adverse effect of other opioids, initial encounter: Secondary | ICD-10-CM

## 2022-12-18 DIAGNOSIS — E7801 Familial hypercholesterolemia: Secondary | ICD-10-CM

## 2022-12-18 DIAGNOSIS — G35 Multiple sclerosis: Secondary | ICD-10-CM

## 2022-12-18 DIAGNOSIS — E559 Vitamin D deficiency, unspecified: Secondary | ICD-10-CM

## 2022-12-18 DIAGNOSIS — E538 Deficiency of other specified B group vitamins: Secondary | ICD-10-CM | POA: Diagnosis not present

## 2022-12-18 DIAGNOSIS — G4731 Primary central sleep apnea: Secondary | ICD-10-CM | POA: Diagnosis not present

## 2022-12-18 DIAGNOSIS — K219 Gastro-esophageal reflux disease without esophagitis: Secondary | ICD-10-CM

## 2022-12-18 DIAGNOSIS — K5903 Drug induced constipation: Secondary | ICD-10-CM

## 2022-12-18 DIAGNOSIS — M542 Cervicalgia: Secondary | ICD-10-CM | POA: Diagnosis not present

## 2022-12-18 DIAGNOSIS — M5136 Other intervertebral disc degeneration, lumbar region: Secondary | ICD-10-CM | POA: Diagnosis not present

## 2022-12-18 DIAGNOSIS — E7849 Other hyperlipidemia: Secondary | ICD-10-CM

## 2022-12-18 MED ORDER — REPATHA SURECLICK 140 MG/ML ~~LOC~~ SOAJ: 140.0000 mg | SUBCUTANEOUS | 0 refills | Status: DC

## 2022-12-18 MED ORDER — CYANOCOBALAMIN 1000 MCG/ML IJ SOLN
1000.0000 ug | Freq: Once | INTRAMUSCULAR | Status: AC
  Administered 2022-12-18: 1000 ug via INTRAMUSCULAR

## 2022-12-18 MED ORDER — REPATHA SURECLICK 140 MG/ML ~~LOC~~ SOAJ: 140.0000 mg | SUBCUTANEOUS | 2 refills | Status: DC

## 2022-12-18 NOTE — Assessment & Plan Note (Signed)
Discontinue Rosuvastatin for 4 weeks to see if myalgia/fatigue resolves, then start Repatha 140 mg injection once every 14 days. Labs drawn today.

## 2022-12-18 NOTE — Assessment & Plan Note (Signed)
The current medical regimen is effective;  continue present plan and medications.  Omeprazole 40 mg daily 

## 2022-12-18 NOTE — Assessment & Plan Note (Addendum)
Secondary to narcotics use particularly fentanyl patches. Patient needs to work with pain clinic to decrease narcotics.

## 2022-12-18 NOTE — Patient Instructions (Signed)
+++++  PA APPROVED FOR REPATHA TODAY+++++

## 2022-12-18 NOTE — Assessment & Plan Note (Signed)
Continue Vitamin B12 injections once monthly.

## 2022-12-18 NOTE — Assessment & Plan Note (Signed)
>>  ASSESSMENT AND PLAN FOR B12 DEFICIENCY WRITTEN ON 12/18/2022 12:10 PM BY Precious Reel, CMA  Continue Vitamin B12 injections once monthly.

## 2022-12-18 NOTE — Telephone Encounter (Signed)
PA submitted and approved via covermymeds for Repatha.

## 2022-12-18 NOTE — Progress Notes (Unsigned)
Subjective:  Patient ID: Christina Cantrell, female    DOB: 07-May-1961  Age: 62 y.o. MRN: [id number]  Chief Complaint  Patient presents with   Vitamin B12 deficiency    HPI Patient is a 62 year old white female with multiple sclerosis, hyperlipidemia, depression who presents for chronic follow-up.  Patient has seen cardiology and interventional radiology. Patient's SMA was stented 02/2022. Her abdominal pain resolved. ECHO normal.  CTA:  IMPRESSION: 1. Coronary calcium score of 89.1. This was 90 percentile for age and sex matched control. 2. Normal coronary origin with right dominance. 3. CAD-RADS 2. Mild non-obstructive CAD (25-49%) (worst stenosis - RCA 25-49%). Consider non-atherosclerotic causes of chest pain. Consider preventive therapy and risk factor modification. Lung CT scan also showed mild emphysema.  No lung cancer.    Hyperlipidemia: Rosuvastatin 40 mg daily was increased, on aspirin 81 mg daily and plavix 75 mg daily. Patient states she is now feeling bad since increase of Statin.  Patient was recommended to take b12 shots weekly. She is currently doing Vitamin b12 shots once monthly.  Depression: She is taking Celexa 40 mg daily.  MS: advanced. Not on any current medications for MS and prefers to not be on any. Patient went to see Dr. Vickey Huger for her sleep apnea but not for her MS. Dr. Vickey Huger does not treat MS.  Opioid dependence: patient sees pain clinic. On percocet 10/325 mg q4 hours as needed. Duragesic patches 75 mcg once every 48 hours. On flexeril 10 mg three times a day as needed.       12/18/2022   11:26 AM 10/04/2021   10:39 AM 09/16/2020    9:54 AM  Depression screen PHQ 2/9  Decreased Interest 0 0 2  Down, Depressed, Hopeless 0 0 2  PHQ - 2 Score 0 0 4  Altered sleeping  0 3  Tired, decreased energy  0 3  Change in appetite  1   Feeling bad or failure about yourself   0 1  Trouble concentrating  0 0  Moving slowly or fidgety/restless  0 0   Suicidal thoughts  0 0  PHQ-9 Score  1 11  Difficult doing work/chores  Not difficult at all          10/04/2021   10:40 AM 08/28/2022    4:38 PM 08/29/2022    5:34 AM 12/18/2022   11:18 AM  Fall Risk  Falls in the past year? 0   0  Was there an injury with Fall? 0   0  Fall Risk Category Calculator 0   0  Fall Risk Category (Retired) Low     (RETIRED) Patient Fall Risk Level Low fall risk Low fall risk Low fall risk   Patient at Risk for Falls Due to    No Fall Risks  Fall risk Follow up Falls evaluation completed   Falls evaluation completed      Review of Systems  Constitutional:  Negative for appetite change, fatigue and fever.  HENT:  Negative for congestion, ear pain, sinus pressure and sore throat.   Respiratory:  Negative for cough, chest tightness, shortness of breath and wheezing.   Cardiovascular:  Positive for chest pain (Angina which patient has nitro to take PRN). Negative for palpitations.  Gastrointestinal:  Negative for abdominal pain, constipation, diarrhea, nausea and vomiting.  Genitourinary:  Negative for dysuria and hematuria.  Musculoskeletal:  Negative for arthralgias, back pain, joint swelling and myalgias.  Skin:  Negative for rash.  Neurological:  Negative  for dizziness, weakness and headaches.  Psychiatric/Behavioral:  Negative for dysphoric mood. The patient is not nervous/anxious.     Current Outpatient Medications on File Prior to Visit  Medication Sig Dispense Refill   albuterol (VENTOLIN HFA) 108 (90 Base) MCG/ACT inhaler Inhale 2 puffs into the lungs every 6 (six) hours as needed for wheezing or shortness of breath.     aspirin EC 81 MG tablet Take 81 mg by mouth daily. Swallow whole.     citalopram (CELEXA) 40 MG tablet Take 40 mg by mouth daily.     clopidogrel (PLAVIX) 75 MG tablet Take 1 tablet (75 mg total) by mouth daily. 90 tablet 1   cyanocobalamin (VITAMIN B12) 1000 MCG/ML injection INJECT 1 ML (1,000 MCG TOTAL) INTO THE MUSCLE EVERY  30 DAYS. 3 mL 1   cyclobenzaprine (FLEXERIL) 10 MG tablet Take 10 mg by mouth 3 (three) times daily as needed for muscle spasms.     doxycycline (PERIOSTAT) 20 MG tablet Take 20 mg by mouth 2 (two) times daily.     fentaNYL (DURAGESIC) 75 MCG/HR Place 1 patch onto the skin 2 days.     Loteprednol Etabonate 0.5 % GEL Place 1 drop into both eyes daily as needed (optic neuritis).     metroNIDAZOLE (METROCREAM) 0.75 % cream Apply 1 Application topically 2 (two) times daily.     norethindrone-ethinyl estradiol (FYAVOLV) 1-5 MG-MCG TABS tablet Take 1 tablet by mouth daily.     omeprazole (PRILOSEC) 40 MG capsule TAKE 1 CAPSULE (40 MG TOTAL) BY MOUTH DAILY. 90 capsule 1   oxyCODONE-acetaminophen (PERCOCET) 10-325 MG tablet Take 1 tablet by mouth every 4 (four) hours as needed for pain.     Polyethyl Glycol-Propyl Glycol (SYSTANE OP) Place 1 drop into both eyes daily as needed (dry eyes).     RELISTOR 150 MG TABS Take 3 tablets by mouth every morning.     rosuvastatin (CRESTOR) 20 MG tablet TAKE 1 TABLET BY MOUTH EVERY DAY 90 tablet 1   Vitamin D, Ergocalciferol, (DRISDOL) 1.25 MG (50000 UNIT) CAPS capsule TAKE 1 CAPSULE (50,000 UNITS TOTAL) BY MOUTH TWO TIMES A WEEK 12 capsule 0   nitroGLYCERIN (NITROSTAT) 0.4 MG SL tablet Place 1 tablet (0.4 mg total) under the tongue every 5 (five) minutes as needed. 25 tablet 6   No current facility-administered medications on file prior to visit.   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   Centrilobular emphysema 10/04/2021   Cervical radiculopathy 07/21/2021   Chronic low back pain 06/04/2013   Degeneration of lumbar intervertebral disc 06/04/2013   Depression 09/16/2020   Epigastric pain 10/04/2021   Esophageal dysphagia 02/13/2022   Fatigue 09/16/2020   Flank pain 10/04/2021   Gastroesophageal reflux disease 02/13/2022   Left lower quadrant abdominal pain 12/29/2021   Leg edema, left 12/29/2021   Localized osteoporosis  without current pathological fracture 06/27/2021   Mixed hyperlipidemia 06/27/2021   Multiple sclerosis 1991   Age 62 yo   Neck pain 09/12/2021   Neuropathic pain 11/12/2017   Optic neuritis 09/17/2018   Osteoarthritis 09/17/2018   Osteopenia 11/20/2018   Other chest pain 02/18/2022   Pain in thoracic spine 06/04/2013   Paresthesia 02/13/2022   Polyuria 02/18/2022   Screening for lung cancer 12/29/2021   Spinal stenosis 09/17/2018   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 No pertinent family history. Social History   Socioeconomic History   Marital status: Married    Spouse name: Not on file   Number of children: Not on file   Years of education: Not on file   Highest education level: Not on file  Occupational History   Not on file  Tobacco Use   Smoking status: Former    Years: 20    Types: Cigarettes    Quit date: 2018    Years since quitting: 6.3   Smokeless tobacco: Never  Substance and Sexual Activity   Alcohol use: Never   Drug use: Never   Sexual activity: Not on file  Other Topics Concern   Not on file  Social History Narrative   Not on file   Social Determinants of Health   Financial Resource Strain: Not on file  Food Insecurity: No Food Insecurity (08/31/2022)   Hunger Vital Sign    Worried About Running Out of Food in the Last Year: Never true    Ran Out of Food in the  Last Year: Never true  Transportation Needs: No Transportation Needs (08/31/2022)   PRAPARE - Administrator, Civil Service (Medical): No    Lack of Transportation (Non-Medical): No  Physical Activity: Not on file  Stress: Not on file  Social Connections: Not on file    Objective:  BP 128/76 (BP Location: Left Arm, Patient Position: Sitting)   Pulse 85   Temp 98.1 F (36.7 C) (Temporal)   Ht 5\' 4"  (1.626 m)   Wt 134 lb (60.8 kg)   BMI 23.00 kg/m      12/18/2022   11:11 AM 10/30/2022    3:17 PM 09/13/2022   11:03 AM  BP/Weight  Systolic BP 128 119 120  Diastolic BP 76 75 80  Wt. (Lbs) 134 134.6 139  BMI 23 kg/m2 23.1 kg/m2 23.86 kg/m2    Physical Exam Vitals reviewed.  Constitutional:      Appearance: Normal appearance. She is normal weight.  Cardiovascular:     Rate and Rhythm: Normal rate and regular rhythm.     Heart sounds: Normal heart sounds.  Pulmonary:     Effort: Pulmonary effort is normal.     Breath sounds: Normal breath sounds.  Abdominal:     General: Abdomen is flat. Bowel sounds are normal.     Palpations: Abdomen is soft.     Tenderness: There is no abdominal tenderness.  Neurological:     Mental Status: She is alert and oriented to person, place, and time.  Psychiatric:        Mood and Affect: Mood normal.        Behavior: Behavior normal.     Diabetic Foot Exam - Simple   No data filed      Lab Results  Component Value Date   WBC 7.4 12/18/2022   HGB 15.2 12/18/2022   HCT 46.2 12/18/2022   PLT 313 12/18/2022  GLUCOSE 75 12/18/2022   CHOL 181 12/18/2022   TRIG 131 12/18/2022   HDL 73 12/18/2022   LDLCALC 85 12/18/2022   ALT 8 12/18/2022   AST 12 12/18/2022   NA 140 12/18/2022   K 4.5 12/18/2022   CL 99 12/18/2022   CREATININE 0.96 12/18/2022   BUN 11 12/18/2022   CO2 25 12/18/2022   TSH 2.610 02/13/2022   INR 1.0 03/02/2022      Assessment & Plan:    Therapeutic opioid-induced constipation (OIC) Assessment  & Plan: The current medical regimen is effective;  continue present plan and medications.  Drink plenty fluids. Miralax.   Central sleep apnea syndrome Assessment & Plan: Secondary to narcotics use particularly fentanyl patches. Patient needs to work with pain clinic to decrease narcotics.   Multiple sclerosis Assessment & Plan: Continue to follow up with Neurology. Patient still does not want to take any MS medications.   Familial hypercholesterolemia Assessment & Plan: Start Repatha 140 mg every 14 days.  Continue to work on eating a healthy diet and exercise.  Labs drawn today.    Orders: -     CBC With Diff/Platelet -     Comprehensive metabolic panel -     Lipid panel -     Repatha SureClick; Inject 140 mg into the skin every 14 (fourteen) days.  Dispense: 2 mL; Refill: 2 -     Repatha SureClick; Inject 140 mg into the skin every 14 (fourteen) days.  Dispense: 2 mL; Refill: 0 -     Cardiovascular Risk Assessment  B12 deficiency Assessment & Plan: Continue Vitamin B12 injections once monthly.  Orders: -     Cyanocobalamin  GERD without esophagitis Assessment & Plan: The current medical regimen is effective;  continue present plan and medications.  Omeprazole 40 mg daily.   Vitamin D deficiency Assessment & Plan: Check vitamin D level.  Orders: -     VITAMIN D 25 Hydroxy (Vit-D Deficiency, Fractures)     Meds ordered this encounter  Medications   Evolocumab (REPATHA SURECLICK) 140 MG/ML SOAJ    Sig: Inject 140 mg into the skin every 14 (fourteen) days.    Dispense:  2 mL    Refill:  2   Evolocumab (REPATHA SURECLICK) 140 MG/ML SOAJ    Sig: Inject 140 mg into the skin every 14 (fourteen) days.    Dispense:  2 mL    Refill:  0   cyanocobalamin (VITAMIN B12) injection 1,000 mcg    Orders Placed This Encounter  Procedures   CBC With Diff/Platelet   Comprehensive metabolic panel   Lipid panel   VITAMIN D 25 Hydroxy (Vit-D Deficiency, Fractures)    Cardiovascular Risk Assessment     Follow-up: Return in about 4 months (around 04/19/2023) for chronic fasting.    I,Marla I Leal-Borjas,acting as a scribe for Blane Ohara, MD.,have documented all relevant documentation on the behalf of Blane Ohara, MD,as directed by  Blane Ohara, MD while in the presence of Blane Ohara, MD.   An After Visit Summary was printed and given to the patient.  Blane Ohara, MD [facility] [phone]

## 2022-12-18 NOTE — Assessment & Plan Note (Addendum)
Continue to follow up with Neurology. Patient still does not want to take any MS medications.

## 2022-12-18 NOTE — Assessment & Plan Note (Signed)
>>  ASSESSMENT AND PLAN FOR FAMILIAL HYPERLIPIDEMIA WRITTEN ON 12/18/2022 12:06 PM BY Precious Reel, CMA  Discontinue Rosuvastatin for 4 weeks to see if myalgia/fatigue resolves, then start Repatha 140 mg injection once every 14 days. Labs drawn today.

## 2022-12-18 NOTE — Assessment & Plan Note (Signed)
Check vitamin D level 

## 2022-12-19 LAB — LIPID PANEL
Chol/HDL Ratio: 2.5 ratio (ref 0.0–4.4)
Cholesterol, Total: 181 mg/dL (ref 100–199)
HDL: 73 mg/dL (ref >39)
LDL Chol Calc (NIH): 85 mg/dL (ref 0–99)
Triglycerides: 131 mg/dL (ref 0–149)
VLDL Cholesterol Cal: 23 mg/dL (ref 5–40)

## 2022-12-19 LAB — COMPREHENSIVE METABOLIC PANEL
ALT: 8 IU/L (ref 0–32)
AST: 12 IU/L (ref 0–40)
Albumin/Globulin Ratio: 2 (ref 1.2–2.2)
Albumin: 4.4 g/dL (ref 3.8–4.9)
Alkaline Phosphatase: 75 IU/L (ref 44–121)
BUN/Creatinine Ratio: 11 (ref 9–23)
BUN: 11 mg/dL (ref 6–24)
Bilirubin Total: 0.4 mg/dL (ref 0.0–1.2)
CO2: 25 mmol/L (ref 20–29)
Calcium: 9.5 mg/dL (ref 8.7–10.2)
Chloride: 99 mmol/L (ref 96–106)
Creatinine, Ser: 0.96 mg/dL (ref 0.57–1.00)
Globulin, Total: 2.2 g/dL (ref 1.5–4.5)
Glucose: 75 mg/dL (ref 70–99)
Potassium: 4.5 mmol/L (ref 3.5–5.2)
Sodium: 140 mmol/L (ref 134–144)
Total Protein: 6.6 g/dL (ref 6.0–8.5)
eGFR: 68 mL/min/{1.73_m2} (ref >59)

## 2022-12-19 LAB — CBC WITH DIFF/PLATELET
Basophils Absolute: 0 10*3/uL (ref 0.0–0.2)
Basos: 1 %
EOS (ABSOLUTE): 0.1 10*3/uL (ref 0.0–0.4)
Eos: 1 %
Hematocrit: 46.2 % (ref 34.0–46.6)
Hemoglobin: 15.2 g/dL (ref 11.1–15.9)
Immature Grans (Abs): 0 10*3/uL (ref 0.0–0.1)
Immature Granulocytes: 0 %
Lymphocytes Absolute: 1.9 10*3/uL (ref 0.7–3.1)
Lymphs: 26 %
MCH: 30.3 pg (ref 26.6–33.0)
MCHC: 32.9 g/dL (ref 31.5–35.7)
MCV: 92 fL (ref 79–97)
Monocytes Absolute: 0.4 10*3/uL (ref 0.1–0.9)
Monocytes: 6 %
Neutrophils Absolute: 5 10*3/uL (ref 1.4–7.0)
Neutrophils: 66 %
Platelets: 313 10*3/uL (ref 150–450)
RBC: 5.01 x10E6/uL (ref 3.77–5.28)
RDW: 12 % (ref 11.7–15.4)
WBC: 7.4 10*3/uL (ref 3.4–10.8)

## 2022-12-19 LAB — CARDIOVASCULAR RISK ASSESSMENT

## 2022-12-19 LAB — VITAMIN D 25 HYDROXY (VIT D DEFICIENCY, FRACTURES): Vit D, 25-Hydroxy: 39.7 ng/mL (ref 30.0–100.0)

## 2022-12-22 DIAGNOSIS — E7801 Familial hypercholesterolemia: Secondary | ICD-10-CM

## 2022-12-22 DIAGNOSIS — T402X5A Adverse effect of other opioids, initial encounter: Secondary | ICD-10-CM | POA: Insufficient documentation

## 2022-12-22 HISTORY — DX: Familial hypercholesterolemia: E78.01

## 2022-12-22 NOTE — Assessment & Plan Note (Signed)
The current medical regimen is effective;  continue present plan and medications.  Drink plenty fluids. Miralax.

## 2022-12-22 NOTE — Assessment & Plan Note (Signed)
Start Repatha 140 mg every 14 days. Continue to work on eating a healthy diet and exercise.  Labs drawn today.

## 2022-12-22 NOTE — Assessment & Plan Note (Signed)
>>  ASSESSMENT AND PLAN FOR THERAPEUTIC OPIOID-INDUCED CONSTIPATION (OIC) WRITTEN ON 12/22/2022  8:28 AM BY LEAL-BORJAS, Adeena Bernabe I, CMA  The current medical regimen is effective;  continue present plan and medications.  Drink plenty fluids. Miralax.

## 2023-01-01 ENCOUNTER — Other Ambulatory Visit: Payer: Self-pay | Admitting: Family Medicine

## 2023-01-21 ENCOUNTER — Other Ambulatory Visit: Payer: Self-pay | Admitting: Family Medicine

## 2023-01-25 DIAGNOSIS — N760 Acute vaginitis: Secondary | ICD-10-CM | POA: Diagnosis not present

## 2023-01-25 DIAGNOSIS — N951 Menopausal and female climacteric states: Secondary | ICD-10-CM | POA: Diagnosis not present

## 2023-02-14 ENCOUNTER — Telehealth (HOSPITAL_COMMUNITY): Payer: Self-pay | Admitting: Student

## 2023-02-14 MED ORDER — CLOPIDOGREL BISULFATE 75 MG PO TABS: 75.0000 mg | ORAL_TABLET | Freq: Every day | ORAL | 1 refills | Status: DC

## 2023-02-14 NOTE — Telephone Encounter (Signed)
Plavix 75 mg, one tablet daily x 30 days with one refill e-prescribed to the patient's pharmacy in [place]. Patient requested to stay on Plavix until she can follow up with Dr. Fredia Sorrow regarding updated imaging on stent.   Alwyn Ren, [place] [phone] 02/14/2023, 9:30 AM

## 2023-02-16 ENCOUNTER — Telehealth (HOSPITAL_COMMUNITY): Payer: Self-pay | Admitting: Student

## 2023-02-16 DIAGNOSIS — K551 Chronic vascular disorders of intestine: Secondary | ICD-10-CM

## 2023-02-16 NOTE — Telephone Encounter (Signed)
Patient will remain on Plavix for now per Dr. Fredia Sorrow. Patient will follow up with Dr. Fredia Sorrow via tele-visit sometime in the next few months. Order placed for a scheduler to call her to arrange a date/time. Patient knows to call our office with any questions/concerns/needs prior to this visit.  Alwyn Ren, [place] [phone] 02/16/2023, 8:14 AM

## 2023-03-02 ENCOUNTER — Ambulatory Visit: Payer: Medicare HMO

## 2023-03-06 ENCOUNTER — Telehealth: Payer: Self-pay

## 2023-03-06 NOTE — Telephone Encounter (Signed)
Called patient to schedule Medicare Annual Wellness Visit (AWV). The patient stated that she will call back to schedule.

## 2023-03-23 ENCOUNTER — Ambulatory Visit: Payer: Medicare HMO | Admitting: Cardiology

## 2023-03-23 NOTE — Progress Notes (Deleted)
  Cardiology Office Note:  .   Date:  03/23/2023  ID:  Christina Cantrell, DOB [birth year], MRN [id number] PCP: Blane Ohara, MD  [facility] Providers Cardiologist:  None { Click to update primary MD,subspecialty MD or APP then REFRESH:1}   History of Present Illness: .   Christina Cantrell is a 62 y.o. 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  Evaluated by Dr. Josiah Lobo on 03/16/22 for chest pain. Coronary CTA was arranged which revealed mild non-obstructive CAD. Echo was arranged and completed on 03/28/22 revealing an EF of 60-65%, mild MR.  LDL  on repatha  85  ROS: ***  Studies Reviewed: .        *** Risk Assessment/Calculations:   {Does this patient have ATRIAL FIBRILLATION?:[phone]} No BP recorded.  {Refresh Note OR Click here to enter BP  :1}***       Physical Exam:   VS:  There were no vitals taken for this visit.   Wt Readings from Last 3 Encounters:  12/18/22 134 lb (60.8 kg)  10/30/22 134 lb 9.6 oz (61.1 kg)  09/13/22 139 lb (63 kg)    GEN: Well nourished, well developed in no acute distress NECK: No JVD; No carotid bruits CARDIAC: ***RRR, no murmurs, rubs, gallops RESPIRATORY:  Clear to auscultation without rales, wheezing or rhonchi  ABDOMEN: Soft, non-tender, n/on-distended EXTREMITIES:  No edema; No deformity   ASSESSMENT AND PLAN: .   ***    {Are you ordering a CV Procedure (e.g. stress test, cath, DCCV, TEE, etc)?   Press F2        :[id number]}  Dispo: ***  Signed, Flossie Dibble, NP

## 2023-03-25 ENCOUNTER — Other Ambulatory Visit: Payer: Self-pay | Admitting: Family Medicine

## 2023-03-25 DIAGNOSIS — E7801 Familial hypercholesterolemia: Secondary | ICD-10-CM

## 2023-04-03 ENCOUNTER — Encounter: Payer: Self-pay | Admitting: Interventional Radiology

## 2023-04-03 ENCOUNTER — Other Ambulatory Visit: Payer: Self-pay | Admitting: Interventional Radiology

## 2023-04-03 DIAGNOSIS — K551 Chronic vascular disorders of intestine: Secondary | ICD-10-CM

## 2023-04-04 DIAGNOSIS — M542 Cervicalgia: Secondary | ICD-10-CM | POA: Diagnosis not present

## 2023-04-04 DIAGNOSIS — M5136 Other intervertebral disc degeneration, lumbar region: Secondary | ICD-10-CM | POA: Diagnosis not present

## 2023-04-04 DIAGNOSIS — M5412 Radiculopathy, cervical region: Secondary | ICD-10-CM | POA: Diagnosis not present

## 2023-04-04 DIAGNOSIS — G35 Multiple sclerosis: Secondary | ICD-10-CM | POA: Diagnosis not present

## 2023-04-05 ENCOUNTER — Encounter: Payer: Self-pay | Admitting: Family Medicine

## 2023-04-05 ENCOUNTER — Ambulatory Visit (INDEPENDENT_AMBULATORY_CARE_PROVIDER_SITE_OTHER): Payer: Medicare HMO | Admitting: Family Medicine

## 2023-04-05 VITALS — BP 136/74 | HR 86 | Temp 97.1°F | Ht 64.0 in | Wt 129.0 lb

## 2023-04-05 DIAGNOSIS — K219 Gastro-esophageal reflux disease without esophagitis: Secondary | ICD-10-CM

## 2023-04-05 DIAGNOSIS — F1120 Opioid dependence, uncomplicated: Secondary | ICD-10-CM | POA: Diagnosis not present

## 2023-04-05 DIAGNOSIS — E538 Deficiency of other specified B group vitamins: Secondary | ICD-10-CM | POA: Diagnosis not present

## 2023-04-05 DIAGNOSIS — G4731 Primary central sleep apnea: Secondary | ICD-10-CM | POA: Diagnosis not present

## 2023-04-05 DIAGNOSIS — E559 Vitamin D deficiency, unspecified: Secondary | ICD-10-CM

## 2023-04-05 DIAGNOSIS — Z114 Encounter for screening for human immunodeficiency virus [HIV]: Secondary | ICD-10-CM

## 2023-04-05 DIAGNOSIS — G35 Multiple sclerosis: Secondary | ICD-10-CM | POA: Diagnosis not present

## 2023-04-05 DIAGNOSIS — T402X5A Adverse effect of other opioids, initial encounter: Secondary | ICD-10-CM | POA: Diagnosis not present

## 2023-04-05 DIAGNOSIS — E782 Mixed hyperlipidemia: Secondary | ICD-10-CM

## 2023-04-05 DIAGNOSIS — K5903 Drug induced constipation: Secondary | ICD-10-CM | POA: Diagnosis not present

## 2023-04-05 DIAGNOSIS — Z1159 Encounter for screening for other viral diseases: Secondary | ICD-10-CM

## 2023-04-05 DIAGNOSIS — E7849 Other hyperlipidemia: Secondary | ICD-10-CM

## 2023-04-05 NOTE — Progress Notes (Signed)
Subjective:  Patient ID: Christina Cantrell, female    DOB: 22-May-1961  Age: 62 y.o. MRN: [id number]  Chief Complaint  Patient presents with   Medical Management of Chronic Issues    HPI Hyperlipidemia: on aspirin 81 mg daily and plavix 75 mg daily, repatha every 14 days Intolerant to crestor.   She is currently doing Vitamin b12 shots once monthly.   Depression: She is taking Celexa 40 mg daily.   MS: advanced. Not on any current medications for MS and prefers to not be on any. Patient went to see Dr. Vickey Huger for her sleep apnea but not for her MS. Likely fentanyl causing sleep issues. Dr. Vickey Huger does not treat MS.   Opioid dependence: patient sees pain clinic [facility] neurosurgery.) On percocet 10/325 mg q4 hours as needed. Duragesic patches 75 mcg once every 48 hours. On flexeril 10 mg three times a day as needed.      04/05/2023   10:27 AM 12/18/2022   11:26 AM 10/04/2021   10:39 AM 09/16/2020    9:54 AM  Depression screen PHQ 2/9  Decreased Interest 0 0 0 2  Down, Depressed, Hopeless 0 0 0 2  PHQ - 2 Score 0 0 0 4  Altered sleeping 1  0 3  Tired, decreased energy 3  0 3  Change in appetite 0  1   Feeling bad or failure about yourself  0  0 1  Trouble concentrating 0  0 0  Moving slowly or fidgety/restless 0  0 0  Suicidal thoughts 0  0 0  PHQ-9 Score 4  1 11   Difficult doing work/chores Not difficult at all  Not difficult at all         04/05/2023   10:28 AM  Fall Risk   Falls in the past year? 0  Number falls in past yr: 0  Injury with Fall? 0  Risk for fall due to : History of fall(s)  Follow up Falls evaluation completed    Patient Care Team: Blane Ohara, MD as PCP - General (Family Medicine) Baldo Daub, MD as Consulting Physician (Cardiology) Candace Gallus as Physician Assistant (Neurosurgery) Misenheimer, Marcial Pacas, MD as Consulting Physician (Unknown Physician Specialty) Roxanne Gates, OD (Optometry) Cherly Beach, MD as  Referring Physician (Family Medicine)   Review of Systems  Constitutional:  Negative for chills, fatigue and fever.  HENT:  Negative for congestion, ear pain, rhinorrhea and sore throat.   Respiratory:  Negative for cough and shortness of breath.   Cardiovascular:  Negative for chest pain.  Gastrointestinal:  Positive for nausea. Negative for abdominal pain, constipation, diarrhea and vomiting.  Genitourinary:  Negative for dysuria and urgency.  Musculoskeletal:  Negative for back pain and myalgias.  Neurological:  Positive for headaches. Negative for dizziness, weakness and light-headedness.  Psychiatric/Behavioral:  Negative for dysphoric mood. The patient is not nervous/anxious.     Current Outpatient Medications on File Prior to Visit  Medication Sig Dispense Refill   albuterol (VENTOLIN HFA) 108 (90 Base) MCG/ACT inhaler Inhale 2 puffs into the lungs every 6 (six) hours as needed for wheezing or shortness of breath.     aspirin EC 81 MG tablet Take 81 mg by mouth daily. Swallow whole.     citalopram (CELEXA) 40 MG tablet Take 40 mg by mouth daily.     clopidogrel (PLAVIX) 75 MG tablet Take 1 tablet (75 mg total) by mouth daily. 30 tablet 1   cyanocobalamin (VITAMIN B12)  1000 MCG/ML injection INJECT 1 ML (1,000 MCG TOTAL) INTO THE MUSCLE EVERY 30 DAYS. 3 mL 1   cyclobenzaprine (FLEXERIL) 10 MG tablet Take 10 mg by mouth 3 (three) times daily as needed for muscle spasms.     doxycycline (PERIOSTAT) 20 MG tablet Take 20 mg by mouth 2 (two) times daily.     Evolocumab (REPATHA SURECLICK) 140 MG/ML SOAJ Inject 140 mg into the skin every 14 (fourteen) days. 2 mL 0   fentaNYL (DURAGESIC) 75 MCG/HR Place 1 patch onto the skin 2 days.     Loteprednol Etabonate 0.5 % GEL Place 1 drop into both eyes daily as needed (optic neuritis).     metroNIDAZOLE (METROCREAM) 0.75 % cream Apply 1 Application topically 2 (two) times daily.     nitroGLYCERIN (NITROSTAT) 0.4 MG SL tablet Place 1 tablet (0.4 mg  total) under the tongue every 5 (five) minutes as needed. 25 tablet 6   norethindrone-ethinyl estradiol (FYAVOLV) 1-5 MG-MCG TABS tablet Take 1 tablet by mouth daily.     omeprazole (PRILOSEC) 40 MG capsule TAKE 1 CAPSULE (40 MG TOTAL) BY MOUTH DAILY. 90 capsule 1   oxyCODONE-acetaminophen (PERCOCET) 10-325 MG tablet Take 1 tablet by mouth every 4 (four) hours as needed for pain.     Polyethyl Glycol-Propyl Glycol (SYSTANE OP) Place 1 drop into both eyes daily as needed (dry eyes).     RELISTOR 150 MG TABS Take 3 tablets by mouth every morning.     REPATHA SURECLICK 140 MG/ML SOAJ INJECT 140 MG INTO THE SKIN EVERY 14 (FOURTEEN) DAYS. 6 mL 3   Vitamin D, Ergocalciferol, (DRISDOL) 1.25 MG (50000 UNIT) CAPS capsule TAKE 1 CAPSULE (50,000 UNITS TOTAL) BY MOUTH TWO TIMES A WEEK 24 capsule 1   No current facility-administered medications on file prior to visit.   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   Centrilobular emphysema (HCC) 10/04/2021   Cervical radiculopathy 07/21/2021   Chronic low back pain 06/04/2013   Degeneration of lumbar intervertebral disc 06/04/2013   Depression 09/16/2020   Epigastric pain 10/04/2021   Esophageal dysphagia 02/13/2022   Fatigue 09/16/2020   Flank pain 10/04/2021   Gastroesophageal reflux disease 02/13/2022   Left lower quadrant abdominal pain 12/29/2021   Leg edema, left 12/29/2021   Localized osteoporosis without current pathological fracture 06/27/2021   Mixed hyperlipidemia 06/27/2021   Multiple sclerosis (HCC) 1991   Age 15 yo   Neck pain 09/12/2021   Neuropathic pain 11/12/2017   Optic neuritis 09/17/2018   Osteoarthritis 09/17/2018   Osteopenia 11/20/2018   Other chest pain 02/18/2022   Pain in thoracic spine 06/04/2013   Paresthesia 02/13/2022   Polyuria 02/18/2022   Screening for lung cancer 12/29/2021   Spinal stenosis 09/17/2018   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 No pertinent  family history. Social History   Socioeconomic History   Marital status: Married    Spouse name: Not on file   Number of children: Not on file   Years of education: Not on file   Highest education level: Not on file  Occupational History   Not on file  Tobacco Use   Smoking status: Former    Current packs/day: 0.00    Types: Cigarettes    Start date: 50    Quit date: 2018    Years since quitting: 6.5   Smokeless tobacco: Never  Substance and Sexual Activity   Alcohol use: Never   Drug use: Never   Sexual activity: Not on file  Other Topics Concern   Not on file  Social History Narrative   Not on file   Social Determinants of Health   Financial Resource Strain: Low Risk  (04/05/2023)   Overall Financial Resource Strain (CARDIA)    Difficulty of Paying Living Expenses: Not hard at all  Food Insecurity: No Food Insecurity (08/31/2022)   Hunger Vital Sign    Worried About Running Out of Food in the Last Year: Never true    Ran Out of Food in the Last Year: Never true  Transportation Needs: No Transportation Needs (08/31/2022)   PRAPARE - Administrator, Civil Service (Medical): No    Lack of Transportation (Non-Medical): No  Physical Activity: Inactive (04/05/2023)   Exercise Vital Sign    Days of Exercise per Week: 0 days    Minutes of Exercise per Session: 0 min  Stress: No Stress  Concern Present (04/05/2023)   [employer] of Occupational Health - Occupational Stress Questionnaire    Feeling of Stress : Not at all  Social Connections: Moderately Integrated (04/05/2023)   Social Connection and Isolation Panel [NHANES]    Frequency of Communication with Friends and Family: More than three times a week    Frequency of Social Gatherings with Friends and Family: More than three times a week    Attends Religious Services: More than 4 times per year    Active Member of [employer] or Organizations: No    Attends Engineer, structural: Never    Marital Status: Married    Objective:  BP 136/74   Pulse 86   Temp (!) 97.1 F (36.2 C)   Ht 5\' 4"  (1.626 m)   Wt 129 lb (58.5 kg)   SpO2 94%   BMI 22.14 kg/m      04/05/2023   10:07 AM 12/18/2022   11:11 AM 10/30/2022    3:17 PM  BP/Weight  Systolic BP 136 128 119  Diastolic BP 74 76 75  Wt. (Lbs) 129 134 134.6  BMI 22.14 kg/m2 23 kg/m2 23.1 kg/m2    Physical Exam Vitals reviewed.  Constitutional:      Appearance: Normal appearance. She is normal weight.  Neck:     Vascular: No carotid bruit.  Cardiovascular:     Rate and Rhythm: Normal rate and regular rhythm.     Heart sounds: Normal heart sounds.  Pulmonary:     Effort: Pulmonary effort is normal. No respiratory distress.     Breath sounds: Normal breath sounds.  Abdominal:     General: Abdomen is flat. Bowel sounds are normal.     Palpations: Abdomen is soft.     Tenderness: There is no abdominal tenderness.  Neurological:     Mental Status: She is alert and oriented to person, place, and time.  Psychiatric:        Mood and Affect: Mood normal.        Behavior: Behavior normal.     Diabetic Foot Exam - Simple   No data filed      Lab Results  Component Value Date   WBC 6.6 04/05/2023   HGB 14.0 04/05/2023   HCT 43.9 04/05/2023   PLT 336 04/05/2023   GLUCOSE 74 04/05/2023   CHOL 173 04/05/2023   TRIG 125 04/05/2023   HDL 70  04/05/2023   LDLCALC 81 04/05/2023   ALT 6 04/05/2023   AST 17 04/05/2023   NA 140 04/05/2023   K 5.0 04/05/2023   CL 103 04/05/2023   CREATININE 0.91 04/05/2023   BUN 8 04/05/2023   CO2 22 04/05/2023   TSH 2.610 02/13/2022   INR 1.0 03/02/2022      Assessment & Plan:    Multiple sclerosis (HCC) Assessment & Plan: Stable.  Prefers no medications for MS.   Therapeutic opioid-induced constipation (OIC) Assessment & Plan: Managed by pain clinic.  Currently on Relistor..   Central sleep apnea syndrome Assessment & Plan: Benefits and is compliant and using CPAP.   B12 deficiency Assessment & Plan: Continue B12 supplementation.   GERD without esophagitis Assessment & Plan: Controlled with omeprazole 40 mg daily   Mixed hyperlipidemia -     CBC with Differential/Platelet -     Comprehensive metabolic panel -     Lipid panel  Encounter for hepatitis C screening test for low risk patient -     HCV Ab w Reflex to Quant PCR  Encounter for screening for HIV -     HIV Antibody (routine testing w rflx)  Familial hyperlipidemia Assessment & Plan: Well-controlled. Continue Repatha and aspirin. Recommend continue to work on eating healthy diet and exercise. Check lipids.   Vitamin D deficiency Assessment & Plan: Continue vitamin D twice weekly.      No orders of the defined types were placed in this encounter.   Orders Placed This Encounter  Procedures   CBC with Differential/Platelet   Comprehensive metabolic panel   Lipid panel   HCV Ab w Reflex to Quant PCR   HIV Antibody (routine testing w rflx)     Follow-up: Return in about 3 months (around 07/06/2023) for chronic, chronic fasting.   I,Katherina A Bramblett,acting as a scribe for Blane Ohara, MD.,have documented all relevant documentation on the behalf of Blane Ohara, MD,as directed by  Blane Ohara, MD while in the presence of Blane Ohara, MD.   An After Visit Summary was printed and given to  the patient.  Blane Ohara, MD [facility] [phone]

## 2023-04-07 NOTE — Assessment & Plan Note (Signed)
>>  ASSESSMENT AND PLAN FOR FAMILIAL HYPERLIPIDEMIA WRITTEN ON 04/07/2023 11:12 PM BY Zeynep Fantroy, MD  Well-controlled. Continue Repatha and aspirin. Recommend continue to work on eating healthy diet and exercise. Check lipids.

## 2023-04-07 NOTE — Assessment & Plan Note (Signed)
Continue vitamin D twice weekly.

## 2023-04-07 NOTE — Assessment & Plan Note (Signed)
>>  ASSESSMENT AND PLAN FOR THERAPEUTIC OPIOID-INDUCED CONSTIPATION (OIC) WRITTEN ON 04/07/2023 11:12 PM BY COX, KIRSTEN, MD  Managed by pain clinic.  Currently on Relistor.Marland Kitchen

## 2023-04-07 NOTE — Assessment & Plan Note (Signed)
-  Continue B12 supplementation °

## 2023-04-07 NOTE — Assessment & Plan Note (Signed)
>>  ASSESSMENT AND PLAN FOR B12 DEFICIENCY WRITTEN ON 04/07/2023 11:11 PM BY COX, KIRSTEN, MD  Continue B12 supplementation.

## 2023-04-07 NOTE — Assessment & Plan Note (Signed)
Benefits and is compliant and using CPAP.

## 2023-04-07 NOTE — Assessment & Plan Note (Signed)
Controlled with omeprazole 40 mg daily.  

## 2023-04-07 NOTE — Assessment & Plan Note (Addendum)
Well-controlled. Continue Repatha and aspirin. Recommend continue to work on eating healthy diet and exercise. Check lipids.

## 2023-04-07 NOTE — Assessment & Plan Note (Signed)
Stable.  Prefers no medications for MS.

## 2023-04-07 NOTE — Assessment & Plan Note (Signed)
Managed by pain clinic.  Currently on Relistor.Marland Kitchen

## 2023-04-13 ENCOUNTER — Other Ambulatory Visit: Payer: Self-pay | Admitting: Student

## 2023-04-13 MED ORDER — CLOPIDOGREL BISULFATE 75 MG PO TABS: 75.0000 mg | ORAL_TABLET | Freq: Every day | ORAL | 0 refills | Status: DC

## 2023-04-18 ENCOUNTER — Other Ambulatory Visit: Payer: Self-pay | Admitting: Student

## 2023-04-19 ENCOUNTER — Other Ambulatory Visit: Payer: Self-pay | Admitting: Student

## 2023-04-19 ENCOUNTER — Ambulatory Visit (INDEPENDENT_AMBULATORY_CARE_PROVIDER_SITE_OTHER): Payer: Medicare HMO

## 2023-04-19 VITALS — BP 128/74 | HR 84 | Resp 16 | Ht 64.0 in | Wt 129.0 lb

## 2023-04-19 DIAGNOSIS — Z0000 Encounter for general adult medical examination without abnormal findings: Secondary | ICD-10-CM

## 2023-04-19 DIAGNOSIS — Z5986 Financial insecurity: Secondary | ICD-10-CM

## 2023-04-19 NOTE — Progress Notes (Signed)
Subjective:   Christina Cantrell is a 62 y.o. female who presents for Medicare Annual (Subsequent) preventive examination.  This wellness visit is conducted by a nurse.  The patient's medications were reviewed and reconciled since the patient's last visit.  History details were provided by the patient and her husband.  The history appears to be reliable.    Medical History: Patient history and Family history was reviewed  Medications, Allergies, and preventative health maintenance was reviewed and updated.   Visit Complete: In person  Cardiac Risk Factors include: advanced age (>7men, >91 women);dyslipidemia     Objective:    Today's Vitals   04/19/23 1358  BP: 128/74  Pulse: 84  Resp: 16  Weight: 129 lb (58.5 kg)  Height: 5\' 4"  (1.626 m)  PainSc: 4    Body mass index is 22.14 kg/m.     08/28/2022    4:37 PM 03/02/2022    8:05 AM  Advanced Directives  Does Patient Have a Medical Advance Directive? No Yes  Does patient want to make changes to medical advance directive?  No - Patient declined    Current Medications (verified) Outpatient Encounter Medications as of 04/19/2023  Medication Sig   albuterol (VENTOLIN HFA) 108 (90 Base) MCG/ACT inhaler Inhale 2 puffs into the lungs every 6 (six) hours as needed for wheezing or shortness of breath.   aspirin EC 81 MG tablet Take 81 mg by mouth daily. Swallow whole.   citalopram (CELEXA) 40 MG tablet Take 40 mg by mouth daily.   clopidogrel (PLAVIX) 75 MG tablet Take 1 tablet (75 mg total) by mouth daily.   cyanocobalamin (VITAMIN B12) 1000 MCG/ML injection INJECT 1 ML (1,000 MCG TOTAL) INTO THE MUSCLE EVERY 30 DAYS.   cyclobenzaprine (FLEXERIL) 10 MG tablet Take 10 mg by mouth 3 (three) times daily as needed for muscle spasms.   doxycycline (PERIOSTAT) 20 MG tablet Take 20 mg by mouth 2 (two) times daily.   Evolocumab (REPATHA SURECLICK) 140 MG/ML SOAJ Inject 140 mg into the skin every 14 (fourteen) days.   fentaNYL (DURAGESIC)  75 MCG/HR Place 1 patch onto the skin 2 days.   Loteprednol Etabonate 0.5 % GEL Place 1 drop into both eyes daily as needed (optic neuritis).   metroNIDAZOLE (METROCREAM) 0.75 % cream Apply 1 Application topically 2 (two) times daily.   norethindrone-ethinyl estradiol (FYAVOLV) 1-5 MG-MCG TABS tablet Take 1 tablet by mouth daily.   omeprazole (PRILOSEC) 40 MG capsule TAKE 1 CAPSULE (40 MG TOTAL) BY MOUTH DAILY.   oxyCODONE-acetaminophen (PERCOCET) 10-325 MG tablet Take 1 tablet by mouth every 4 (four) hours as needed for pain.   Polyethyl Glycol-Propyl Glycol (SYSTANE OP) Place 1 drop into both eyes daily as needed (dry eyes).   RELISTOR 150 MG TABS Take 3 tablets by mouth every morning.   REPATHA SURECLICK 140 MG/ML SOAJ INJECT 140 MG INTO THE SKIN EVERY 14 (FOURTEEN) DAYS.   Vitamin D, Ergocalciferol, (DRISDOL) 1.25 MG (50000 UNIT) CAPS capsule TAKE 1 CAPSULE (50,000 UNITS TOTAL) BY MOUTH TWO TIMES A WEEK   nitroGLYCERIN (NITROSTAT) 0.4 MG SL tablet Place 1 tablet (0.4 mg total) under the tongue every 5 (five) minutes as needed.   No facility-administered encounter medications on file as of 04/19/2023.    Allergies (verified) Sertraline hcl   History: 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   Centrilobular emphysema (HCC) 10/04/2021   Cervical radiculopathy 07/21/2021   Chronic low back pain 06/04/2013  Degeneration of lumbar intervertebral disc 06/04/2013   Depression 09/16/2020   Epigastric pain 10/04/2021   Esophageal dysphagia 02/13/2022   Fatigue 09/16/2020   Flank pain 10/04/2021   Gastroesophageal reflux disease 02/13/2022   Left lower quadrant abdominal pain 12/29/2021   Leg edema, left 12/29/2021   Localized osteoporosis without current pathological fracture 06/27/2021   Mixed hyperlipidemia 06/27/2021   Multiple sclerosis (HCC) 1991   Age 62 yo   Neck pain 09/12/2021   Neuropathic pain 11/12/2017   Optic neuritis 09/17/2018   Osteoarthritis  09/17/2018   Osteopenia 11/20/2018   Other chest pain 02/18/2022   Pain in thoracic spine 06/04/2013   Paresthesia 02/13/2022   Polyuria 02/18/2022   Screening for lung cancer 12/29/2021   Spinal stenosis 09/17/2018   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 No pertinent family history. Social History   Socioeconomic History   Marital status: Married    Spouse name: Molly Maduro   Number of children: 1   Years of education: Not on file   Highest education level: Associate degree: occupational, Scientist, product/process development, or vocational program  Occupational History   Not on file  Tobacco Use   Smoking status: Former    Current packs/day: 0.00    Types: Cigarettes    Start date: 1998    Quit date: 2018    Years since quitting: 6.6   Smokeless tobacco: Never  Vaping Use   Vaping status: Never Used  Substance and Sexual Activity   Alcohol use: Never   Drug use: Never   Sexual activity: Not on file  Other Topics Concern   Not on file  Social History Narrative   Not on file   Social Determinants of Health   Financial Resource Strain: Low Risk  (04/05/2023)   Overall Financial Resource Strain (CARDIA)    Difficulty of Paying Living Expenses: Not hard at all  Food Insecurity: No Food Insecurity (04/19/2023)   Hunger Vital Sign    Worried About Running Out of Food in the Last Year: Never true    Ran Out  of Food in the Last Year: Never true  Transportation Needs: No Transportation Needs (08/31/2022)   PRAPARE - Administrator, Civil Service (Medical): No    Lack of Transportation (Non-Medical): No  Physical Activity: Inactive (04/05/2023)   Exercise Vital Sign    Days of Exercise per Week: 0 days    Minutes of Exercise per Session: 0 min  Stress: No Stress Concern Present (04/05/2023)   [employer] of Occupational Health - Occupational Stress Questionnaire    Feeling of Stress : Not at all  Social Connections: Moderately Integrated (04/05/2023)   Social Connection and Isolation Panel [NHANES]    Frequency of Communication with Friends and Family: More than three times a week    Frequency of Social Gatherings with Friends and Family:  More than three times a week    Attends Religious Services: More than 4 times per year    Active Member of Clubs or Organizations: No    Attends Banker Meetings: Never    Marital Status: Married    Tobacco Counseling Counseling given: Not Answered   Clinical Intake:  Pre-visit preparation completed: Yes  Pain : 0-10 Pain Score: 4  Pain Type: Neuropathic pain, Chronic pain Pain Frequency: Constant Pain Relieving Factors: fentanyl Effect of Pain on Daily Activities: moderate Pain Relieving Factors: fentanyl BMI - recorded: 22.14 Nutritional Status: BMI of 19-24  Normal Nutritional Risks: None Diabetes: No How often do you need to have someone help you when you read instructions, pamphlets, or other written materials from your doctor or pharmacy?: 3 - Sometimes What is the last grade level you completed in school?: college Interpreter Needed?: No     Activities of Daily Living    04/19/2023    2:13 PM  In your present state of health, do you have any difficulty performing the following activities:  Hearing? 0  Vision? 1  Difficulty concentrating or making decisions? 1  Walking or climbing stairs? 0  Dressing or  bathing? 0  Doing errands, shopping? 1  Preparing Food and eating ? N  Using the Toilet? N  In the past six months, have you accidently leaked urine? Y  Do you have problems with loss of bowel control? N  Managing your Medications? Y  Managing your Finances? N  Housekeeping or managing your Housekeeping? Y    Patient Care Team: Blane Ohara, MD as PCP - General (Family Medicine) Baldo Daub, MD as Consulting Physician (Cardiology) Candace Gallus as Physician Assistant (Neurosurgery) Misenheimer, Marcial Pacas, MD as Consulting Physician (Unknown Physician Specialty) Roxanne Gates, OD (Optometry) Cherly Beach, MD as Referring Physician (Family Medicine)     Assessment:   This is a routine wellness examination for [place].  Hearing/Vision screen No results found.  Dietary issues and exercise activities discussed:  Aim for 30 minutes of exercise or brisk walking, 6-8 glasses of water, and 5 servings of fruits and vegetables each day.      Goals Addressed             This Visit's Progress    Prevent falls       04/19/2023 AWV Goal: Fall Prevention  Over the next year, patient will decrease their risk for falls by: Using assistive devices, such as a cane or walker, as needed Identifying fall risks within their home and correcting them by: Removing throw rugs Adding handrails to stairs or ramps Removing clutter and keeping a clear pathway throughout the home Increasing light, especially at night Adding shower handles/bars Raising toilet seat Identifying potential personal risk factors for falls: Medication side effects Incontinence/urgency Vestibular dysfunction Hearing loss Musculoskeletal disorders Neurological disorders Orthostatic hypotension         Depression Screen    04/05/2023   10:27 AM 12/18/2022   11:26 AM 10/04/2021   10:39 AM 09/16/2020    9:54 AM  PHQ 2/9 Scores  PHQ - 2 Score 0 0 0 4  PHQ- 9 Score 4  1 11     Fall  Risk    04/19/2023    2:11 PM 04/05/2023   10:28 AM 12/18/2022   11:18 AM 10/04/2021   10:40 AM  Fall Risk   Falls in the past year? 0 0 0 0  Number falls in past yr: 0 0  0 0  Injury with Fall? 0 0 0 0  Risk for fall due to : History of fall(s) History of fall(s) No Fall Risks   Follow up Falls evaluation completed;Education provided Falls evaluation completed Falls evaluation completed Falls evaluation completed    MEDICARE RISK AT HOME:  Medicare Risk at Home - 04/19/23 1441     Any stairs in or around the home? Yes    If so, are there any without handrails? No    Home free of loose throw rugs in walkways, pet beds, electrical cords, etc? Yes    Adequate lighting in your home to reduce risk of falls? Yes    Life alert? No    Use of a cane, walker or w/c? No    Grab bars in the bathroom? No    Shower chair or bench in shower? Yes    Elevated toilet seat or a handicapped toilet? No             TIMED UP AND GO:  Was the test performed?  Yes  Length of time to ambulate 10 feet: 6 sec Gait steady and fast without use of assistive device    Cognitive Function:        Immunizations Immunization History  Administered Date(s) Administered   COVID-19, mRNA, vaccine(Comirnaty)12 years and older 05/26/2022   Influenza Inj Mdck Quad Pf 05/26/2022   Influenza-Unspecified 07/26/2020, 06/07/2021   Moderna Sars-Covid-2 Vaccination 11/06/2019, 12/25/2019, 05/05/2020   PFIZER Comirnaty(Gray Top)Covid-19 Tri-Sucrose Vaccine 12/13/2020, 06/07/2021   Tdap 07/29/2019    TDAP status: Up to date  Flu Vaccine status: Due, Education has been provided regarding the importance of this vaccine. Advised may receive this vaccine at local pharmacy or Health Dept. Aware to provide a copy of the vaccination record if obtained from local pharmacy or Health Dept. Verbalized acceptance and understanding.  Pneumococcal vaccine status: Up to date  Covid-19 vaccine status: Completed  vaccines  Qualifies for Shingles Vaccine? Yes   Zostavax completed No   Shingrix Completed?: No.    Education has been provided regarding the importance of this vaccine. Patient has been advised to call insurance company to determine out of pocket expense if they have not yet received this vaccine. Advised may also receive vaccine at local pharmacy or Health Dept. Verbalized acceptance and understanding.  Screening Tests Health Maintenance  Topic Date Due   Zoster Vaccines- Shingrix (1 of 2) Never done   COVID-19 Vaccine (7 - 2023-24 season) 07/21/2022   INFLUENZA VACCINE  04/05/2023   DEXA SCAN  07/22/2023   MAMMOGRAM  02/28/2024   Medicare Annual Wellness (AWV)  04/18/2024   PAP SMEAR-Modifier  11/03/2024   Colonoscopy  01/02/2026   DTaP/Tdap/Td (2 - Td or Tdap) 07/28/2029   Hepatitis C Screening  Completed   HIV Screening  Completed   HPV VACCINES  Aged Out    Health Maintenance  Health Maintenance Due  Topic Date Due   Zoster Vaccines- Shingrix (1 of 2) Never done   COVID-19 Vaccine (7 - 2023-24 season) 07/21/2022   INFLUENZA VACCINE  04/05/2023    Colorectal cancer screening: Type of screening: Colonoscopy. Completed 01/2016. Repeat every 10 years  Mammogram status: coming up next week  Bone Density status: Completed 2022. Results reflect: Bone density results: OSTEOPENIA. Repeat every 2 years.  Lung Cancer Screening: (Low Dose CT Chest recommended if Age 17-80 years, 20 pack-year currently smoking OR have quit w/in 15years.) does not qualify.   Lung Cancer Screening Referral: N/A  Additional Screening:  Vision Screening: Recommended annual ophthalmology exams for early detection of glaucoma and other disorders of the eye. Is the patient up to date with their annual eye exam?  Yes   Dental Screening: Recommended annual dental exams for proper oral hygiene   Community Resource Referral / Chronic Care Management: CRR required this visit?  No   CCM required this  visit?  No     Plan:    1- Counseling was provided today regarding the following topics: healthy eating habits, home safety, vitamin and mineral supplementation (calcium and Vit D), regular exercise, breast self-exam, tobacco avoidance, limitation of alcohol intake, use of seat belts, firearm safety, and fall prevention.  Annual recommendations include: influenza vaccine, dental cleanings, and eye exams.  Referral made to pharmacy team to get help with Repatha and Relistor cost.   I have personally reviewed and noted the following in the patient's chart:   Medical and social history Use of alcohol, tobacco or illicit drugs  Current medications and supplements including opioid prescriptions.  Functional ability and status Nutritional status Physical activity Advanced directives List of other physicians Hospitalizations, surgeries, and ER visits in previous 12 months Vitals Screenings to include cognitive, depression, and falls Referrals and appointments  In addition, I have reviewed and discussed with patient certain preventive protocols, quality metrics, and best practice recommendations. A written personalized care plan for preventive services as well as general preventive health recommendations were provided to patient.     Jacklynn Bue, LPN   7/82/9562   After Visit Summary: Available in MyChart

## 2023-04-19 NOTE — Patient Instructions (Signed)
Christina Cantrell , Thank you for taking time to come for your Medicare Wellness Visit. I appreciate your ongoing commitment to your health goals. Please review the following plan we discussed and let me know if I can assist you in the future.   These are the goals we discussed:  Goals      Prevent falls     04/19/2023 AWV Goal: Fall Prevention  Over the next year, patient will decrease their risk for falls by: Using assistive devices, such as a cane or walker, as needed Identifying fall risks within their home and correcting them by: Removing throw rugs Adding handrails to stairs or ramps Removing clutter and keeping a clear pathway throughout the home Increasing light, especially at night Adding shower handles/bars Raising toilet seat Identifying potential personal risk factors for falls: Medication side effects Incontinence/urgency Vestibular dysfunction Hearing loss Musculoskeletal disorders Neurological disorders Orthostatic hypotension          This is a list of the screening recommended for you and due dates:  Health Maintenance  Topic Date Due   Zoster (Shingles) Vaccine (1 of 2) Never done   COVID-19 Vaccine (7 - 2023-24 season) 07/21/2022   Flu Shot  04/05/2023   DEXA scan (bone density measurement)  07/22/2023   Mammogram  02/28/2024   Medicare Annual Wellness Visit  04/18/2024   Pap Smear  11/03/2024   Colon Cancer Screening  01/02/2026   DTaP/Tdap/Td vaccine (2 - Td or Tdap) 07/28/2029   Hepatitis C Screening  Completed   HIV Screening  Completed   HPV Vaccine  Aged Out    Flu vaccines available after 05/08/23 at the office  RSV and Shingrix are recommended and available from the pharmacy.  I am reaching out to our pharmacy team to see if they can assist with medication assistance for Repatha and Relistor.  Preventive Care 40-64 Years, Female Preventive care refers to lifestyle choices and visits with your health care provider that can promote health and  wellness. What does preventive care include? A yearly physical exam. This is also called an annual well check. Dental exams once or twice a year. Routine eye exams. Ask your health care provider how often you should have your eyes checked. Personal lifestyle choices, including: Daily care of your teeth and gums. Regular physical activity. Eating a healthy diet. Avoiding tobacco and drug use. Limiting alcohol use. Practicing safe sex. Taking low-dose aspirin daily starting at age 48. Taking vitamin and mineral supplements as recommended by your health care provider. What happens during an annual well check? The services and screenings done by your health care provider during your annual well check will depend on your age, overall health, lifestyle risk factors, and family history of disease. Counseling  Your health care provider may ask you questions about your: Alcohol use. Tobacco use. Drug use. Emotional well-being. Home and relationship well-being. Sexual activity. Eating habits. Work and work Astron[employer]. Method of birth control. Menstrual cycle. Pregnancy history. Screening  You may have the following tests or measurements: Height, weight, and BMI. Blood pressure. Lipid and cholesterol levels. These may be checked every 5 years, or more frequently if you are over 33 years old. Skin check. Lung cancer screening. You may have this screening every year starting at age 83 if you have a 30-pack-year history of smoking and currently smoke or have quit within the past 15 years. Fecal occult blood test (FOBT) of the stool. You may have this test every year starting at age 29.  Flexible sigmoidoscopy or colonoscopy. You may have a sigmoidoscopy every 5 years or a colonoscopy every 10 years starting at age 77. Hepatitis C blood test. Hepatitis B blood test. Sexually transmitted disease (STD) testing. Diabetes screening. This is done by checking your blood sugar (glucose) after you  have not eaten for a while (fasting). You may have this done every 1-3 years. Mammogram. This may be done every 1-2 years. Talk to your health care provider about when you should start having regular mammograms. This may depend on whether you have a family history of breast cancer. BRCA-related cancer screening. This may be done if you have a family history of breast, ovarian, tubal, or peritoneal cancers. Pelvic exam and Pap test. This may be done every 3 years starting at age 55. Starting at age 29, this may be done every 5 years if you have a Pap test in combination with an HPV test. Bone density scan. This is done to screen for osteoporosis. You may have this scan if you are at high risk for osteoporosis. Discuss your test results, treatment options, and if necessary, the need for more tests with your health care provider. Vaccines  Your health care provider may recommend certain vaccines, such as: Influenza vaccine. This is recommended every year. Tetanus, diphtheria, and acellular pertussis (Tdap, Td) vaccine. You may need a Td booster every 10 years. Zoster vaccine. You may need this after age 2. Pneumococcal 13-valent conjugate (PCV13) vaccine. You may need this if you have certain conditions and were not previously vaccinated. Pneumococcal polysaccharide (PPSV23) vaccine. You may need one or two doses if you smoke cigarettes or if you have certain conditions. Talk to your health care provider about which screenings and vaccines you need and how often you need them. This information is not intended to replace advice given to you by your health care provider. Make sure you discuss any questions you have with your health care provider. Document Released: 09/17/2015 Document Revised: 05/10/2016 Document Reviewed: 06/22/2015 Elsevier Interactive Patient Education  2017 [employer].    Fall Prevention in the Home Falls can cause injuries. They can happen to people of all ages. There are  many things you can do to make your home safe and to help prevent falls. What can I do on the outside of my home? Regularly fix the edges of walkways and driveways and fix any cracks. Remove anything that might make you trip as you walk through a door, such as a raised step or threshold. Trim any bushes or trees on the path to your home. Use bright outdoor lighting. Clear any walking paths of anything that might make someone trip, such as rocks or tools. Regularly check to see if handrails are loose or broken. Make sure that both sides of any steps have handrails. Any raised decks and porches should have guardrails on the edges. Have any leaves, snow, or ice cleared regularly. Use sand or salt on walking paths during winter. Clean up any spills in your garage right away. This includes oil or grease spills. What can I do in the bathroom? Use night lights. Install grab bars by the toilet and in the tub and shower. Do not use towel bars as grab bars. Use non-skid mats or decals in the tub or shower. If you need to sit down in the shower, use a plastic, non-slip stool. Keep the floor dry. Clean up any water that spills on the floor as soon as it happens. Remove soap buildup in  the tub or shower regularly. Attach bath mats securely with double-sided non-slip rug tape. Do not have throw rugs and other things on the floor that can make you trip. What can I do in the bedroom? Use night lights. Make sure that you have a light by your bed that is easy to reach. Do not use any sheets or blankets that are too big for your bed. They should not hang down onto the floor. Have a firm chair that has side arms. You can use this for support while you get dressed. Do not have throw rugs and other things on the floor that can make you trip. What can I do in the kitchen? Clean up any spills right away. Avoid walking on wet floors. Keep items that you use a lot in easy-to-reach places. If you need to reach  something above you, use a strong step stool that has a grab bar. Keep electrical cords out of the way. Do not use floor polish or wax that makes floors slippery. If you must use wax, use non-skid floor wax. Do not have throw rugs and other things on the floor that can make you trip. What can I do with my stairs? Do not leave any items on the stairs. Make sure that there are handrails on both sides of the stairs and use them. Fix handrails that are broken or loose. Make sure that handrails are as long as the stairways. Check any carpeting to make sure that it is firmly attached to the stairs. Fix any carpet that is loose or worn. Avoid having throw rugs at the top or bottom of the stairs. If you do have throw rugs, attach them to the floor with carpet tape. Make sure that you have a light switch at the top of the stairs and the bottom of the stairs. If you do not have them, ask someone to add them for you. What else can I do to help prevent falls? Wear shoes that: Do not have high heels. Have rubber bottoms. Are comfortable and fit you well. Are closed at the toe. Do not wear sandals. If you use a stepladder: Make sure that it is fully opened. Do not climb a closed stepladder. Make sure that both sides of the stepladder are locked into place. Ask someone to hold it for you, if possible. Clearly mark and make sure that you can see: Any grab bars or handrails. First and last steps. Where the edge of each step is. Use tools that help you move around (mobility aids) if they are needed. These include: Canes. Walkers. Scooters. Crutches. Turn on the lights when you go into a dark area. Replace any light bulbs as soon as they burn out. Set up your furniture so you have a clear path. Avoid moving your furniture around. If any of your floors are uneven, fix them. If there are any pets around you, be aware of where they are. Review your medicines with your doctor. Some medicines can make you  feel dizzy. This can increase your chance of falling. Ask your doctor what other things that you can do to help prevent falls. This information is not intended to replace advice given to you by your health care provider. Make sure you discuss any questions you have with your health care provider. Document Released: 06/17/2009 Document Revised: 01/27/2016 Document Reviewed: 09/25/2014 Elsevier Interactive Patient Education  2017 [employer].

## 2023-04-23 ENCOUNTER — Ambulatory Visit: Admission: RE | Admit: 2023-04-23 | Payer: Medicare HMO | Source: Ambulatory Visit

## 2023-04-23 ENCOUNTER — Telehealth: Payer: Self-pay

## 2023-04-23 ENCOUNTER — Ambulatory Visit
Admission: RE | Admit: 2023-04-23 | Discharge: 2023-04-23 | Disposition: A | Discharge status: Home / Self Care | Payer: Medicare HMO | Source: Ambulatory Visit | Attending: Surgery | Admitting: Surgery

## 2023-04-23 DIAGNOSIS — Z95828 Presence of other vascular implants and grafts: Secondary | ICD-10-CM | POA: Diagnosis not present

## 2023-04-23 DIAGNOSIS — Z1231 Encounter for screening mammogram for malignant neoplasm of breast: Secondary | ICD-10-CM

## 2023-04-23 DIAGNOSIS — K551 Chronic vascular disorders of intestine: Secondary | ICD-10-CM

## 2023-04-23 NOTE — Progress Notes (Signed)
   Care Guide Note  04/23/2023 Name: Christina Cantrell MRN: [id number] DOB: 23-Aug-1961  Referred by: Blane Ohara, MD Reason for referral : Care Management (Outreach to schedule with Pharm d )   Christina Cantrell is a 62 y.o. year old female who is a primary care patient of Cox, Kirsten, MD. Christina Cantrell was referred to the pharmacist for assistance related to HLD.    Successful contact was made with the patient to discuss pharmacy services including being ready for the pharmacist to call at least 5 minutes before the scheduled appointment time, to have medication bottles and any blood sugar or blood pressure readings ready for review. The patient agreed to meet with the pharmacist via with the pharmacist via telephone visit on (date/time).  05/09/2023  Penne Lash, RMA Care Guide [facility]  [place], [place] [address] Direct Dial: [phone] [email or web address]

## 2023-04-26 ENCOUNTER — Other Ambulatory Visit: Payer: Self-pay | Admitting: Surgery

## 2023-04-26 DIAGNOSIS — Z1231 Encounter for screening mammogram for malignant neoplasm of breast: Secondary | ICD-10-CM

## 2023-05-02 DIAGNOSIS — N6011 Diffuse cystic mastopathy of right breast: Secondary | ICD-10-CM | POA: Diagnosis not present

## 2023-05-02 DIAGNOSIS — N6012 Diffuse cystic mastopathy of left breast: Secondary | ICD-10-CM | POA: Diagnosis not present

## 2023-05-09 ENCOUNTER — Ambulatory Visit
Admission: RE | Admit: 2023-05-09 | Discharge: 2023-05-09 | Disposition: A | Discharge status: Home / Self Care | Payer: Medicare HMO | Source: Ambulatory Visit | Attending: Student | Admitting: Student

## 2023-05-09 ENCOUNTER — Other Ambulatory Visit: Payer: Medicare HMO

## 2023-05-09 DIAGNOSIS — K551 Chronic vascular disorders of intestine: Secondary | ICD-10-CM

## 2023-05-09 DIAGNOSIS — Z95828 Presence of other vascular implants and grafts: Secondary | ICD-10-CM | POA: Diagnosis not present

## 2023-05-09 HISTORY — PX: IR RADIOLOGIST EVAL & MGMT: IMG5224

## 2023-05-09 NOTE — Progress Notes (Signed)
05/09/2023 Name: Christina Cantrell MRN: [id number] DOB: 11/04/1960  Chief Complaint  Patient presents with   Medication Assistance   Christina Cantrell is a 62 y.o. year old female who presented for a telephone visit.   They were referred to the pharmacist by their PCP for assistance in managing medication access.   Subjective:  Care Team: Primary Care Provider: Blane Ohara, MD ; Next Scheduled Visit: 11/13  Medication Access/Adherence  Current Pharmacy:  [facility] [phone] Rosalita Levan, [place] - [address] [place] [address] Phone: [phone] Fax: [phone]  -Patient reports affordability concerns with their medications: Yes - Repatha 140mg  every 14 days and Relistor 450mg  daily -Patient reports access/transportation concerns to their pharmacy: No  -Patient reports adherence concerns with their medications:  No    Hyperlipidemia/ASCVD Risk Reduction Current lipid lowering medications: Repatha 140mg  q14 days Antiplatelet regimen: ASA 81mg  daily, clopidogrel 75mg  daily  -Repatha is covered by insurance, but the copay is $404.14 every 3 months- this is not affordable long-term for the patient  Objective: Lab Results  Component Value Date   CREATININE 0.91 04/05/2023   BUN 8 04/05/2023   NA 140 04/05/2023   K 5.0 04/05/2023   CL 103 04/05/2023   CO2 22 04/05/2023   Lab Results  Component Value Date   CHOL 173 04/05/2023   HDL 70 04/05/2023   LDLCALC 81 04/05/2023   TRIG 125 04/05/2023   CHOLHDL 2.5 04/05/2023   Medications Reviewed Today     Reviewed by Lenna Gilford, RPH (Pharmacist) on 05/09/23 at [phone]  Med List Status: <None>   Medication Order Taking? Sig Documenting Provider Last Dose Status Informant  albuterol (VENTOLIN HFA) 108 (90 Base) MCG/ACT inhaler [id number] Yes Inhale 2 puffs into the lungs every 6 (six) hours as needed for wheezing or shortness of breath. [provider] Taking Active Self           Med  Note Littie Deeds, Mehtaab Mayeda A   Wed May 09, 2023  9:06 AM) Needs rarely  aspirin EC 81 MG tablet [id number] Yes Take 81 mg by mouth daily. Swallow whole. [provider] Taking Active Self  citalopram (CELEXA) 40 MG tablet [id number] Yes Take 40 mg by mouth daily. [provider] Taking Active Self  clopidogrel (PLAVIX) 75 MG tablet [id number] Yes Take 1 tablet (75 mg total) by mouth daily. Han, Aimee H, PA-C Taking Active   cyanocobalamin (VITAMIN B12) 1000 MCG/ML injection [id number] Yes INJECT 1 ML (1,000 MCG TOTAL) INTO THE MUSCLE EVERY 30 DAYS. Cox, Kirsten, MD Taking Active   cyclobenzaprine (FLEXERIL) 10 MG tablet [id number] Yes Take 10 mg by mouth 3 (three) times daily as needed for muscle spasms. [provider] Taking Active Self  doxycycline (PERIOSTAT) 20 MG tablet [id number] Yes Take 20 mg by mouth 2 (two) times daily. [provider] Taking Active Self  Evolocumab (REPATHA SURECLICK) 140 MG/ML SOAJ [id number] Yes Inject 140 mg into the skin every 14 (fourteen) days. Blane Ohara, MD Taking Active   fentaNYL (DURAGESIC) 75 MCG/HR [id number] Yes Place 1 patch onto the skin 2 days. [provider] Taking Active Self  metroNIDAZOLE (METROCREAM) 0.75 % cream [id number] Yes Apply 1 Application topically 2 (two) times daily. [provider] Taking Active Self  nitroGLYCERIN (NITROSTAT) 0.4 MG SL tablet [id number] Yes Place 1 tablet (0.4 mg total) under the tongue every 5 (five) minutes as needed. Revankar, Aundra Dubin, MD Taking Active   norethindrone-ethinyl estradiol (FYAVOLV) 1-5  MG-MCG TABS tablet [id number] Yes Take 1 tablet by mouth daily. [provider] Taking Active Self  omeprazole (PRILOSEC) 40 MG capsule [id number] Yes TAKE 1 CAPSULE (40 MG TOTAL) BY MOUTH DAILY. Cox, Kirsten, MD Taking Active   oxyCODONE-acetaminophen (PERCOCET) 10-325 MG tablet [id number] Yes Take 1 tablet by mouth every 4 (four) hours as needed for pain. [provider] Taking Active Self  Polyethyl Glycol-Propyl Glycol (SYSTANE OP) [id number] Yes Place 1 drop into both eyes daily as needed (dry eyes). [provider] Taking Active Self  RELISTOR 150 MG TABS [id number] Yes Take 3 tablets by mouth every morning. [provider] Taking Active   REPATHA SURECLICK 140 MG/ML SOAJ [id number] Yes INJECT 140 MG INTO THE SKIN EVERY 14 (FOURTEEN) DAYS. Cox, Kirsten, MD Taking Active   Vitamin D, Ergocalciferol, (DRISDOL) 1.25 MG (50000 UNIT) CAPS capsule [id number] Yes TAKE 1 CAPSULE (50,000 UNITS TOTAL) BY MOUTH TWO TIMES A WEEK Cox, Kirsten, MD Taking Active            Assessment/Plan:   Medication Access/Adherence -Patient would qualify for Repatha PAP through [employer] and Relistor PAP through [facility]; coordinating with medication assistance team to initiate the application process.  Follow Up Plan: Forwarding note to Lynnda Shields to monitor progress of PAP  Lenna Gilford, PharmD, DPLA

## 2023-05-09 NOTE — Progress Notes (Addendum)
Chief Complaint: Patient was consulted remotely today (TeleHealth) for follow-up after SMA stent placement on 03/02/2022.   History of Present Illness: Christina Cantrell is a 62 y.o. 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 She remains asymptomatic except for her chronic neuropathic pain related to multiple sclerosis and is eating well without abdominal pain. She wishes to remain on Plavix given her stent placement, history of nonobstructive CAD and strong family history of stroke in multiple family members.  A follow up mesenteric duplex ultrasound was performed on 04/23/2023.  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   Centrilobular emphysema (HCC) 10/04/2021   Cervical radiculopathy 07/21/2021   Chronic low back pain 06/04/2013   Degeneration of lumbar intervertebral disc 06/04/2013   Depression 09/16/2020   Epigastric pain 10/04/2021   Esophageal dysphagia 02/13/2022   Fatigue 09/16/2020   Flank pain 10/04/2021   Gastroesophageal reflux disease 02/13/2022   Left lower quadrant abdominal pain 12/29/2021   Leg edema, left 12/29/2021   Localized osteoporosis without current pathological fracture 06/27/2021   Mixed hyperlipidemia 06/27/2021   Multiple sclerosis (HCC) 1991   Age 27 yo   Neck pain 09/12/2021   Neuropathic pain 11/12/2017   Optic neuritis 09/17/2018   Osteoarthritis 09/17/2018   Osteopenia 11/20/2018   Other chest pain 02/18/2022   Pain in thoracic spine 06/04/2013   Paresthesia 02/13/2022   Polyuria 02/18/2022   Screening for lung cancer 12/29/2021   Spinal stenosis 09/17/2018   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   Therapeutic opioid-induced constipation (OIC) 02/18/2022   Uncomplicated opioid dependence (HCC)  09/12/2021   Vitamin D deficiency 06/27/2021    Past Surgical History:  Procedure Laterality Date   BREAST BIOPSY Bilateral 08/24/2020   BREAST EXCISIONAL BIOPSY Right 1975   IR ANGIOGRAM VISCERAL SELECTIVE  03/02/2022   IR RADIOLOGIST EVAL & MGMT  02/21/2022   IR RADIOLOGIST EVAL & MGMT  04/03/2022   IR RADIOLOGIST EVAL & MGMT  06/12/2022   IR TRANSCATH PLC STENT 1ST ART NOT LE CV CAR VERT CAR  03/02/2022   IR US GUIDE VASC ACCESS RIGHT  03/02/2022    Allergies: Sertraline hcl  Medications: Prior to Admission medications   Medication Sig Start Date End Date Taking? Authorizing Provider  albuterol (VENTOLIN HFA) 108 (90 Base) MCG/ACT inhaler Inhale 2 puffs into the lungs every 6 (six) hours as needed for wheezing or shortness of breath.    [provider]  aspirin EC 81 MG tablet Take 81 mg by mouth daily. Swallow whole.    [provider]  citalopram (CELEXA) 40 MG tablet Take 40 mg by mouth daily. 08/02/20   [provider]  clopidogrel (PLAVIX) 75 MG tablet Take 1 tablet (75 mg total) by mouth daily. 04/13/23 05/13/23  Han, Aimee H, PA-C  cyanocobalamin (VITAMIN B12) 1000 MCG/ML injection INJECT 1 ML (1,000 MCG TOTAL) INTO THE MUSCLE EVERY 30 DAYS. 10/26/22   Cox, Fritzi Mandes, MD  cyclobenzaprine (FLEXERIL) 10 MG tablet Take 10 mg by mouth 3 (three) times daily as needed for muscle spasms. 07/31/20   [provider]  doxycycline (PERIOSTAT) 20 MG tablet Take 20 mg by mouth 2 (two) times daily. 12/07/21   [provider]  Evolocumab (REPATHA SURECLICK) 140 MG/ML SOAJ Inject 140 mg into  the skin every 14 (fourteen) days. 12/18/22   CoxFritzi Mandes, MD  fentaNYL (DURAGESIC) 75 MCG/HR Place 1 patch onto the skin 2 days.    [provider]  metroNIDAZOLE (METROCREAM) 0.75 % cream Apply 1 Application topically 2 (two) times daily. 09/07/21   [provider]  nitroGLYCERIN (NITROSTAT) 0.4 MG SL tablet Place 1 tablet (0.4 mg total) under the tongue  every 5 (five) minutes as needed. 03/16/22 05/09/23  Revankar, Aundra Dubin, MD  norethindrone-ethinyl estradiol (FYAVOLV) 1-5 MG-MCG TABS tablet Take 1 tablet by mouth daily.    [provider]  omeprazole (PRILOSEC) 40 MG capsule TAKE 1 CAPSULE (40 MG TOTAL) BY MOUTH DAILY. 11/16/22   CoxFritzi Mandes, MD  oxyCODONE-acetaminophen (PERCOCET) 10-325 MG tablet Take 1 tablet by mouth every 4 (four) hours as needed for pain. 08/13/20   [provider]  Polyethyl Glycol-Propyl Glycol (SYSTANE OP) Place 1 drop into both eyes daily as needed (dry eyes).    [provider]  RELISTOR 150 MG TABS Take 3 tablets by mouth every morning.    [provider]  REPATHA SURECLICK 140 MG/ML SOAJ INJECT 140 MG INTO THE SKIN EVERY 14 (FOURTEEN) DAYS. 03/25/23 03/24/24  CoxFritzi Mandes, MD  Vitamin D, Ergocalciferol, (DRISDOL) 1.25 MG (50000 UNIT) CAPS capsule TAKE 1 CAPSULE (50,000 UNITS TOTAL) BY MOUTH TWO TIMES A WEEK 01/22/23   Blane Ohara, MD     No family history on file.  Social History   Socioeconomic History   Marital status: Married    Spouse name: Molly Maduro   Number of children: 1   Years of education: Not on file   Highest education level: Associate degree: occupational, Scientist, product/process development, or vocational program  Occupational History   Not on file  Tobacco Use   Smoking status: Former    Current packs/day: 0.00    Types: Cigarettes    Start date: 1998    Quit date: 2018    Years since quitting: 6.6   Smokeless tobacco: Never  Vaping Use   Vaping status: Never Used  Substance and Sexual Activity   Alcohol use: Never   Drug use: Never   Sexual activity: Not on file  Other Topics Concern   Not on file  Social History Narrative   Not on file   Social Determinants of Health   Financial Resource Strain: Low Risk  (04/19/2023)   Overall Financial Resource Strain (CARDIA)    Difficulty of Paying Living Expenses: Not very hard  Food Insecurity: No Food Insecurity (04/19/2023)    Hunger Vital Sign    Worried About Running Out of Food in the Last Year: Never true    Ran Out of Food in the Last Year: Never true  Transportation Needs: No Transportation Needs (08/31/2022)   PRAPARE - Administrator, Civil Service (Medical): No    Lack of Transportation (Non-Medical): No  Physical Activity: Inactive (04/05/2023)   Exercise Vital Sign    Days of Exercise per Week: 0 days    Minutes of Exercise per Session: 0 min  Stress: No Stress Concern Present (04/05/2023)   [employer] of Occupational Health - Occupational Stress Questionnaire    Feeling of Stress : Not at all  Social Connections: Moderately Integrated (04/05/2023)   Social Connection and Isolation Panel [NHANES]    Frequency of Communication with Friends and Family: More than three times a week    Frequency of Social Gatherings with Friends and Family: More than three times a week  Attends Religious Services: More than 4 times per year    Active Member of Clubs or Organizations: No    Attends Banker Meetings: Never    Marital Status: Married     Review of Systems  Constitutional: Negative.   Respiratory: Negative.    Cardiovascular: Negative.   Gastrointestinal: Negative.   Genitourinary: Negative.   Musculoskeletal: Negative.   Neurological: Negative.     Review of Systems: A 12 point ROS discussed and pertinent positives are indicated in the HPI above.  All other systems are negative.   Physical Exam No direct physical exam was performed (except for noted visual exam findings with Video Visits).    Vital Signs: There were no vitals taken for this visit.  Imaging: MM 3D SCREEN BREAST BILATERAL  Result Date: 04/25/2023 CLINICAL DATA:  Screening. EXAM: DIGITAL SCREENING BILATERAL MAMMOGRAM WITH TOMOSYNTHESIS AND CAD TECHNIQUE: Bilateral screening digital craniocaudal and mediolateral oblique mammograms were obtained. Bilateral screening digital breast tomosynthesis was  performed. The images were evaluated with computer-aided detection. COMPARISON:  Previous exam(s). ACR Breast Density Category c: The breasts are heterogeneously dense, which may obscure small masses. FINDINGS: There are no findings suspicious for malignancy. IMPRESSION: No mammographic evidence of malignancy. A result letter of this screening mammogram will be mailed directly to the patient. RECOMMENDATION: Screening mammogram in one year. (Code:SM-B-01Y) BI-RADS CATEGORY  1: Negative. Electronically Signed   By: Norva Pavlov M.D.   On: 04/25/2023 11:00   [place] MESENTERIC ARTERIES  Result Date: 04/23/2023 CLINICAL DATA:  Status post intravascular stent placement in proximal superior mesenteric artery to treat critical symptomatic stenosis on 03/02/2022. Follow-up mesenteric duplex ultrasound performed. EXAM: [place] MESENTERIC ARTERIAL DOPPLER COMPARISON:  06/12/2022 FINDINGS: Celiac axis: 184-200 cm/sec Celiac axis with inspiration: 236 cm/sec Celiac axis with expiration: 234 cm/sec Splenic artery: 164 cm/sec Hepatic artery: 222 cm/sec SMA: 172 proximal, 137 mid, 130 distal cm/sec. Proximal SMA stent demonstrates normal patency. IMA: 90 cm/sec Aorta: 73-93 cm/sec Aortic size: 2.0 cm proximally, 1.5 cm in the mid segment and 1.3 cm distally IMPRESSION: Normally patent SMA stent with no evidence to suggest recurrent SMA stenosis. Electronically Signed   By: Irish Lack M.D.   On: 04/23/2023 14:27    Labs:  CBC: Recent Labs    05/18/22 0845 08/28/22 1704 08/28/22 1712 12/18/22 1143 04/05/23 1033  WBC 6.7 6.6  --  7.4 6.6  HGB 14.5 15.5* 16.0* 15.2 14.0  HCT 43.5 45.9 47.0* 46.2 43.9  PLT 282 339  --  313 336    COAGS: No results for input(s): "INR", "APTT" in the last 8760 hours.  BMP: Recent Labs    08/28/22 1704 08/28/22 1712 09/13/22 1215 12/18/22 1143 04/05/23 1033  NA 137 137 137 140 140  K 3.8 3.8 4.1 4.5 5.0  CL 103 101 101 99 103  CO2 24  --  23 25 22   GLUCOSE 132* 127*  68* 75 74  BUN 11 11 11 11 8   CALCIUM 9.2  --  9.2 9.5 9.7  CREATININE 0.97 1.00 0.98 0.96 0.91  GFRNONAA >60  --   --   --   --     LIVER FUNCTION TESTS: Recent Labs    08/28/22 1704 09/13/22 1215 12/18/22 1143 04/05/23 1033  BILITOT 0.5 0.4 0.4 0.4  AST 18 15 12 17   ALT 10 8 8 6   ALKPHOS 54 60 75 84  PROT 6.7 6.4 6.6 6.7  ALBUMIN 3.9 4.3 4.4 4.5  Assessment and Plan:  I spoke with Christina Cantrell and her husband over the phone. I reviewed the mesenteric duplex ultrasound study with them from 04/23/2023 demonstrating a widely patent SMA stent with normal velocities. She also had a CTA of the abdomen on 08/29/2022 through the Emergency Department for diarrhea and abdominal pain demonstrating normal patency and positioning of the stent.  I recommended a follow up duplex ultrasound in one year to follow stent patency. She requested a 19-month refill of Plavix and then plans to ask Dr. Tomie China to manage her Plavix since she sees him regularly for her CAD. I will follow up with her after her follow up duplex ultrasound next year.   Electronically Signed: Reola Calkins 05/09/2023, 12:56 PM    I spent a total of 15 Minutes in remote  clinical consultation, greater than 50% of which was counseling/coordinating care post intravascular stenting of the SMA.    Visit type: Audio only (telephone). Audio (no video) only due to patient's lack of internet/smartphone capability. Alternative for in-person consultation at [facility], [address], [place], [place]. This visit type was conducted due to national recommendations for restrictions regarding the COVID-19 Pandemic (e.g. social distancing).  This format is felt to be most appropriate for this patient at this time.  All issues noted in this document were discussed and addressed.

## 2023-05-10 ENCOUNTER — Other Ambulatory Visit: Payer: Self-pay | Admitting: Student

## 2023-05-10 MED ORDER — CLOPIDOGREL BISULFATE 75 MG PO TABS: 75.0000 mg | ORAL_TABLET | Freq: Every day | ORAL | 2 refills | Status: DC

## 2023-05-16 ENCOUNTER — Other Ambulatory Visit: Payer: Self-pay | Admitting: Family Medicine

## 2023-05-16 DIAGNOSIS — K219 Gastro-esophageal reflux disease without esophagitis: Secondary | ICD-10-CM

## 2023-05-17 ENCOUNTER — Other Ambulatory Visit: Payer: Self-pay

## 2023-05-17 ENCOUNTER — Other Ambulatory Visit (HOSPITAL_COMMUNITY): Payer: Self-pay

## 2023-05-22 ENCOUNTER — Encounter: Payer: Self-pay | Admitting: Cardiology

## 2023-05-22 ENCOUNTER — Ambulatory Visit: Payer: Medicare HMO | Attending: Cardiology | Admitting: Cardiology

## 2023-05-22 VITALS — BP 120/86 | HR 112 | Ht 64.0 in | Wt 130.8 lb

## 2023-05-22 DIAGNOSIS — Z87891 Personal history of nicotine dependence: Secondary | ICD-10-CM

## 2023-05-22 DIAGNOSIS — E782 Mixed hyperlipidemia: Secondary | ICD-10-CM

## 2023-05-22 DIAGNOSIS — K551 Chronic vascular disorders of intestine: Secondary | ICD-10-CM

## 2023-05-22 DIAGNOSIS — I739 Peripheral vascular disease, unspecified: Secondary | ICD-10-CM

## 2023-05-22 DIAGNOSIS — E7849 Other hyperlipidemia: Secondary | ICD-10-CM

## 2023-05-22 MED ORDER — EZETIMIBE 10 MG PO TABS
10.0000 mg | ORAL_TABLET | Freq: Every day | ORAL | 3 refills | Status: DC
Start: 2023-05-22 — End: 2023-06-27

## 2023-05-22 NOTE — Addendum Note (Signed)
Addended by: Eleonore Chiquito on: 05/22/2023 03:08 PM   Modules accepted: Orders

## 2023-05-22 NOTE — Progress Notes (Signed)
Cardiology Office Note:    Date:  05/22/2023   ID:  AHSOKA VEVERKA, DOB 03/28/1961, MRN [id number]  PCP:  Blane Ohara, MD  Cardiologist:  Garwin Brothers, MD   Referring MD: Blane Ohara, MD    ASSESSMENT:    1. Mixed hyperlipidemia   2. Superior mesenteric artery stenosis (HCC)   3. Familial hyperlipidemia   4. Ex-smoker    PLAN:    In order of problems listed above:  Coronary artery disease: Secondary prevention stressed with the patient.  Importance of compliance with diet medication stressed and she vocalized understanding.  She was advised to walk least half another day 5 days a week and she promises to do so. Poor peripheral pulses: Will do an ABI to assess this. Post SMA stenting.:  She is followed by her vascular specialist and she is on dual antiplatelet therapy for this. Mixed dyslipidemia: On PCSK9 therapy.  Goal LDL must be less than 60.  I offered her to add medication such as Zetia but she is not keen on it.  She is going to diet and follow with primary care for this evaluation. Patient will be seen in follow-up appointment in 6 months or earlier if the patient has any concerns.    Medication Adjustments/Labs and Tests Ordered: Current medicines are reviewed at length with the patient today.  Concerns regarding medicines are outlined above.  Orders Placed This Encounter  Procedures   EKG 12-Lead   No orders of the defined types were placed in this encounter.    No chief complaint on file.    History of Present Illness:    VENNESA Cantrell is a 62 y.o. female.  Patient has past medical history of coronary artery calcification and coronary artery disease, mixed dyslipidemia and history of COPD.  She is ex-smoker.  She denies any problems at this time and takes care of activities of daily living.  She mentions to me that her circulation in the legs might not be the best.  She feels cold in her feet at times.  At the time of my evaluation, the patient is  alert awake oriented and in no distress.  Past Medical History:  Diagnosis Date   Angina pectoris (HCC) 03/16/2022   B12 deficiency 09/13/2022   Central sleep apnea comorbid with prescribed opioid use, moderate 10/30/2022   Central sleep apnea syndrome 05/30/2018   Centrilobular emphysema (HCC) 10/04/2021   Cervical radiculopathy 07/21/2021   Chronic low back pain 06/04/2013   Degeneration of lumbar intervertebral disc 06/04/2013   Epigastric pain 10/04/2021   Esophageal dysphagia 02/13/2022   Ex-smoker 03/16/2022   Familial hypercholesterolemia 12/22/2022   Familial hyperlipidemia 06/27/2021   GERD without esophagitis 09/13/2022   Localized osteoporosis without current pathological fracture 06/27/2021   Mixed hyperlipidemia 06/27/2021   Multiple sclerosis (HCC) 1991   Age 48 yo   Need for influenza vaccination 05/28/2022   Neuropathic pain 11/12/2017   Optic neuritis 09/17/2018   Osteoarthritis 09/17/2018   Osteopenia 11/20/2018   Other chest pain 02/18/2022   Pain in thoracic spine 06/04/2013   Pain of upper abdomen 10/04/2021   Paresthesia 02/13/2022   Polyuria 02/18/2022   Screening for lung cancer 12/29/2021   Spinal stenosis 09/17/2018   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   Therapeutic opioid induced constipation 02/18/2022   Therapeutic opioid-induced constipation (OIC) 02/18/2022   Uncomplicated opioid dependence (HCC) 09/12/2021   Vitamin B12 deficiency 05/28/2022   Vitamin D deficiency  06/27/2021    Past Surgical History:  Procedure Laterality Date   BREAST BIOPSY Bilateral 08/24/2020   BREAST EXCISIONAL BIOPSY Right 1975   IR ANGIOGRAM VISCERAL SELECTIVE  03/02/2022   IR RADIOLOGIST EVAL & MGMT  02/21/2022   IR RADIOLOGIST EVAL & MGMT  04/03/2022   IR RADIOLOGIST EVAL & MGMT  06/12/2022   IR RADIOLOGIST EVAL & MGMT  05/09/2023   IR TRANSCATH PLC STENT 1ST ART NOT LE CV CAR VERT CAR  03/02/2022   IR US GUIDE VASC  ACCESS RIGHT  03/02/2022    Current Medications: Current Meds  Medication Sig   albuterol (VENTOLIN HFA) 108 (90 Base) MCG/ACT inhaler Inhale 2 puffs into the lungs every 6 (six) hours as needed for wheezing or shortness of breath.   aspirin EC 81 MG tablet Take 81 mg by mouth daily. Swallow whole.   citalopram (CELEXA) 40 MG tablet Take 40 mg by mouth daily.   clopidogrel (PLAVIX) 75 MG tablet Take 1 tablet (75 mg total) by mouth daily.   cyanocobalamin (VITAMIN B12) 1000 MCG/ML injection INJECT 1 ML (1,000 MCG TOTAL) INTO THE MUSCLE EVERY 30 DAYS.   cyclobenzaprine (FLEXERIL) 10 MG tablet Take 10 mg by mouth 3 (three) times daily as needed for muscle spasms.   doxycycline (PERIOSTAT) 20 MG tablet Take 20 mg by mouth 2 (two) times daily.   Evolocumab (REPATHA SURECLICK) 140 MG/ML SOAJ Inject 140 mg into the skin every 14 (fourteen) days.   fentaNYL (DURAGESIC) 75 MCG/HR Place 1 patch onto the skin 2 days.   metroNIDAZOLE (METROCREAM) 0.75 % cream Apply 1 Application topically 2 (two) times daily.   nitroGLYCERIN (NITROSTAT) 0.4 MG SL tablet Place 0.4 mg under the tongue every 5 (five) minutes as needed for chest pain.   norethindrone-ethinyl estradiol (FYAVOLV) 1-5 MG-MCG TABS tablet Take 1 tablet by mouth daily.   omeprazole (PRILOSEC) 40 MG capsule TAKE 1 CAPSULE (40 MG TOTAL) BY MOUTH DAILY.   oxyCODONE-acetaminophen (PERCOCET) 10-325 MG tablet Take 1 tablet by mouth every 4 (four) hours as needed for pain.   Polyethyl Glycol-Propyl Glycol (SYSTANE OP) Place 1 drop into both eyes daily as needed (dry eyes).   RELISTOR 150 MG TABS Take 3 tablets by mouth every morning.   REPATHA SURECLICK 140 MG/ML SOAJ INJECT 140 MG INTO THE SKIN EVERY 14 (FOURTEEN) DAYS.   Vitamin D, Ergocalciferol, (DRISDOL) 1.25 MG (50000 UNIT) CAPS capsule TAKE 1 CAPSULE (50,000 UNITS TOTAL) BY MOUTH TWO TIMES A WEEK     Allergies:   Sertraline hcl   Social History   Socioeconomic History   Marital status:  Married    Spouse name: Molly Maduro   Number of children: 1   Years of education: Not on file   Highest education level: Associate degree: occupational, Scientist, product/process development, or vocational program  Occupational History   Not on file  Tobacco Use   Smoking status: Former    Current packs/day: 0.00    Types: Cigarettes    Start date: 1998    Quit date: 2018    Years since quitting: 6.7   Smokeless tobacco: Never  Vaping Use   Vaping status: Never Used  Substance and Sexual Activity   Alcohol use: Never   Drug use: Never   Sexual activity: Not on file  Other Topics Concern   Not on file  Social History Narrative   Not on file   Social Determinants of Health   Financial Resource Strain: Low Risk  (04/19/2023)   Overall  Financial Resource Strain (CARDIA)    Difficulty of Paying Living Expenses: Not very hard  Food Insecurity: No Food Insecurity (04/19/2023)   Hunger Vital Sign    Worried About Running Out of Food in the Last Year: Never true    Ran Out of Food in the Last Year: Never true  Transportation Needs: No Transportation Needs (08/31/2022)   PRAPARE - Administrator, Civil Service (Medical): No    Lack of Transportation (Non-Medical): No  Physical Activity: Inactive (04/05/2023)   Exercise Vital Sign    Days of Exercise per Week: 0 days    Minutes of Exercise per Session: 0 min  Stress: No Stress Concern Present (04/05/2023)   [employer] of Occupational Health - Occupational Stress Questionnaire    Feeling of Stress : Not at all  Social Connections: Moderately Integrated (04/05/2023)   Social Connection and Isolation Panel [NHANES]    Frequency of Communication with Friends and Family: More than three times a week    Frequency of Social Gatherings with Friends and Family: More than three times a week    Attends Religious Services: More than 4 times per year    Active Member of [employer] or Organizations: No    Attends Banker Meetings: Never    Marital  Status: Married     Family History: The patient's Family history is unknown by patient.  ROS:   Please see the history of present illness.    All other systems reviewed and are negative.  EKGs/Labs/Other Studies Reviewed:    The following studies were reviewed today: .Marland KitchenEKG Interpretation Date/Time:  Tuesday May 22 2023 14:31:04 EDT Ventricular Rate:  112 PR Interval:  130 QRS Duration:  72 QT Interval:  318 QTC Calculation: 434 R Axis:   61  Text Interpretation: Sinus tachycardia Right atrial enlargement Anteroseptal infarct , age undetermined Abnormal ECG When compared with ECG of 24-Jan-2000 13:17, Vent. rate has increased BY  42 BPM Anteroseptal infarct is now Present T wave amplitude has decreased in Anterior leads Confirmed by Belva Crome [id number]) on 05/22/2023 2:42:15 PM     Recent Labs: 04/05/2023: ALT 6; BUN 8; Creatinine, Ser 0.91; Hemoglobin 14.0; Platelets 336; Potassium 5.0; Sodium 140  Recent Lipid Panel    Component Value Date/Time   CHOL 173 04/05/2023 1033   TRIG 125 04/05/2023 1033   HDL 70 04/05/2023 1033   CHOLHDL 2.5 04/05/2023 1033   LDLCALC 81 04/05/2023 1033    Physical Exam:    VS:  BP 120/86   Pulse (!) 112   Ht 5\' 4"  (1.626 m)   Wt 130 lb 12.8 oz (59.3 kg)   SpO2 97%   BMI 22.45 kg/m     Wt Readings from Last 3 Encounters:  05/22/23 130 lb 12.8 oz (59.3 kg)  04/19/23 129 lb (58.5 kg)  04/05/23 129 lb (58.5 kg)     GEN: Patient is in no acute distress HEENT: Normal NECK: No JVD; No carotid bruits LYMPHATICS: No lymphadenopathy CARDIAC: Hear sounds regular, 2/6 systolic murmur at the apex. RESPIRATORY:  Clear to auscultation without rales, wheezing or rhonchi  ABDOMEN: Soft, non-tender, non-distended MUSCULOSKELETAL:  No edema; No deformity  SKIN: Warm and dry NEUROLOGIC:  Alert and oriented x 3 PSYCHIATRIC:  Normal affect   Signed, Garwin Brothers, MD  05/22/2023 2:54 PM    [facility]

## 2023-05-22 NOTE — Patient Instructions (Signed)
Medication Instructions:  Your physician recommends that you continue on your current medications as directed. Please refer to the Current Medication list given to you today.  *If you need a refill on your cardiac medications before your next appointment, please call your pharmacy*   Lab Work: None ordered If you have labs (blood work) drawn today and your tests are completely normal, you will receive your results only by: MyChart Message (if you have MyChart) OR A paper copy in the mail If you have any lab test that is abnormal or we need to change your treatment, we will call you to review the results.   Testing/Procedures: Your physician has requested that you have an ankle brachial index (ABI). During this test an ultrasound and blood pressure cuff are used to evaluate the arteries that supply the arms and legs with blood. Allow thirty minutes for this exam. There are no restrictions or special instructions.    Follow-Up: At [facility], you and your health needs are our priority.  As part of our continuing mission to provide you with exceptional heart care, we have created designated Provider Care Teams.  These Care Teams include your primary Cardiologist (physician) and Advanced Practice Providers (APPs -  Physician Assistants and Nurse Practitioners) who all work together to provide you with the care you need, when you need it.  We recommend signing up for the patient portal called "MyChart".  Sign up information is provided on this After Visit Summary.  MyChart is used to connect with patients for Virtual Visits (Telemedicine).  Patients are able to view lab/test results, encounter notes, upcoming appointments, etc.  Non-urgent messages can be sent to your provider as well.   To learn more about what you can do with MyChart, go to [email or web address].    Your next appointment:   9 month(s)  The format for your next appointment:   In Person  Provider:   Belva Crome, MD    Other Instructions none  Important Information About Sugar

## 2023-05-23 ENCOUNTER — Telehealth: Payer: Self-pay | Admitting: Cardiology

## 2023-05-23 NOTE — Telephone Encounter (Signed)
Recommendations reviewed with pt as per Dr. Leander Rams note.  Pt verbalized understanding and had no additional questions.

## 2023-05-23 NOTE — Telephone Encounter (Signed)
Pt stated once she left her office visit appt yesterday she noticed on her EKG results in her MyChart the word "infarction" was on there and now she'd very concerned and would like to know why it wasn't mentioned during her appt. Pt is requesting a callback as soon as possible. Please advise.

## 2023-06-12 ENCOUNTER — Telehealth: Payer: Self-pay

## 2023-06-12 NOTE — Telephone Encounter (Signed)
Patient called stating that the Repatha has been causing headache and brain fog.  She wanted to let you know that Dr Tomie China recommended Zetia 10 mg and she will be starting that soon.

## 2023-06-12 NOTE — Telephone Encounter (Signed)
Ok. Dr Tome Wilson  

## 2023-06-18 ENCOUNTER — Other Ambulatory Visit: Payer: Medicare HMO | Admitting: Pharmacist

## 2023-06-18 NOTE — Progress Notes (Signed)
   06/18/2023 Name: MARYSA WESSNER MRN: [id number] DOB: 1961-08-05  Chief Complaint  Patient presents with   Hyperlipidemia   Medication Assistance    Care Coordination  SHATERRA SANZONE is a 62 y.o. year old female who presented for a telephone visit.   They were referred to the pharmacist by  inbasket message  for assistance in managing hyperlipidemia and medication access. Previous pharmacist initiated patient assistance for repatha and relistor, contacting patient today to inquire about patient assistance updates.   S/O: Patient began repatha, was on it for ~1-1.5 months and had an unpleasant outcome,  Experienced symptoms such as headache, fogginess, and hypertension, 150-160s/90s, an occasional 175/110. She has been off the medication for one week and noticing symptom improvement.  She has not received any paperwork yet regarding patient assistance.  A/P: - Hyperlipidemia: recommend crestor 5mg  daily + ezetimibe 10mg  daily, cardiology has goal of LDL <50, she is going to discuss this option with Dr Dulce Sellar at upcoming visit since repatha did not work well for her. - Medication Access:  disregard repatha patient assistance, but will inquire update for relistor patient assistance via rx med assist team  Follow-up: 2 weeks for patient assistance updates   Lynnda Shields, PharmD, BCPS Clinical Pharmacist [facility]

## 2023-06-18 NOTE — Patient Instructions (Signed)
Paitlyn,  Keep an eye out in the mail for relistor paperwork to apply for their program to alleviate the cost. I will give you a phone call in a few weeks to check in.  Take care, Elmarie Shiley, PharmD, BCPS Clinical Pharmacist [facility]

## 2023-06-22 ENCOUNTER — Other Ambulatory Visit (HOSPITAL_COMMUNITY): Payer: Self-pay

## 2023-06-26 NOTE — Progress Notes (Unsigned)
Cardiology Office Note:    Date:  06/26/2023   ID:  Christina Cantrell, DOB [birth year], MRN [id number]  PCP:  Blane Ohara, MD  Cardiologist:  Norman Herrlich, MD    Referring MD: Blane Ohara, MD    ASSESSMENT:    No diagnosis found. PLAN:    In order of problems listed above:  ***   Next appointment: ***   Medication Adjustments/Labs and Tests Ordered: Current medicines are reviewed at length with the patient today.  Concerns regarding medicines are outlined above.  No orders of the defined types were placed in this encounter.  No orders of the defined types were placed in this encounter.    History of Present Illness:    Christina Cantrell is a 62 y.o. female with a hx of coronary artery calcification and CAD hyperlipidemia and COPD last seen 05/22/2023 Dr. Tomie China. Compliance with diet, lifestyle and medications: *** Past Medical History:  Diagnosis Date   Angina pectoris (HCC) 03/16/2022   B12 deficiency 09/13/2022   Central sleep apnea comorbid with prescribed opioid use, moderate 10/30/2022   Central sleep apnea syndrome 05/30/2018   Centrilobular emphysema (HCC) 10/04/2021   Cervical radiculopathy 07/21/2021   Chronic low back pain 06/04/2013   Degeneration of lumbar intervertebral disc 06/04/2013   Epigastric pain 10/04/2021   Esophageal dysphagia 02/13/2022   Ex-smoker 03/16/2022   Familial hypercholesterolemia 12/22/2022   Familial hyperlipidemia 06/27/2021   GERD without esophagitis 09/13/2022   Localized osteoporosis without current pathological fracture 06/27/2021   Mixed hyperlipidemia 06/27/2021   Multiple sclerosis (HCC) 1991   Age 61 yo   Need for influenza vaccination 05/28/2022   Neuropathic pain 11/12/2017   Optic neuritis 09/17/2018   Osteoarthritis 09/17/2018   Osteopenia 11/20/2018   Other chest pain 02/18/2022   Pain in thoracic spine 06/04/2013   Pain of upper abdomen 10/04/2021   Paresthesia 02/13/2022   Polyuria 02/18/2022    Screening for lung cancer 12/29/2021   Spinal stenosis 09/17/2018   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   aspirin EC 81 MG tablet Take 81 mg by mouth daily. Swallow whole.   citalopram (CELEXA) 40 MG tablet Take 40 mg by mouth daily.   clopidogrel (PLAVIX) 75 MG tablet Take 1 tablet (75 mg total) by mouth daily.   cyanocobalamin (VITAMIN B12) 1000 MCG/ML injection INJECT 1 ML (1,000 MCG TOTAL) INTO THE MUSCLE EVERY 30 DAYS. (Patient taking differently: Inject 1,000 mcg into the muscle every 30 (thirty) days.)   cyclobenzaprine (FLEXERIL) 10 MG tablet Take 10 mg by mouth 3 (three) times daily as needed for muscle spasms.   doxycycline (PERIOSTAT) 20 MG tablet Take 20 mg by mouth 2 (two) times daily.   ezetimibe (ZETIA) 10 MG tablet Take 1 tablet (10 mg total) by mouth daily.   fentaNYL (DURAGESIC) 75 MCG/HR Place 1 patch onto the skin 2 days.   metroNIDAZOLE (METROCREAM) 0.75 % cream Apply 1 Application topically 2 (two) times daily.   nitroGLYCERIN (NITROSTAT) 0.4 MG SL tablet Place 0.4 mg under the tongue every 5 (five)  minutes as needed for chest pain.   norethindrone-ethinyl estradiol (FYAVOLV) 1-5 MG-MCG TABS tablet Take 1 tablet by mouth daily.   omeprazole (PRILOSEC) 40 MG capsule TAKE 1 CAPSULE (40 MG TOTAL) BY MOUTH DAILY.   oxyCODONE-acetaminophen (PERCOCET) 10-325 MG tablet Take 1 tablet by mouth every 4 (four) hours as needed for pain.   Polyethyl Glycol-Propyl Glycol (SYSTANE OP) Place 1 drop into  both eyes daily as needed (dry eyes).   RELISTOR 150 MG TABS Take 3 tablets by mouth every morning.   Vitamin D, Ergocalciferol, (DRISDOL) 1.25 MG (50000 UNIT) CAPS capsule TAKE 1 CAPSULE (50,000 UNITS TOTAL) BY MOUTH TWO TIMES A WEEK (Patient taking differently: Take 50,000 Units by mouth every 7 (seven) days.)      EKGs/Labs/Other Studies Reviewed:    The following studies were reviewed today:  Cardiac Studies & Procedures       ECHOCARDIOGRAM  ECHOCARDIOGRAM COMPLETE 03/28/2022  Narrative ECHOCARDIOGRAM REPORT    Patient Name:   Christina Cantrell Date of Exam: 03/28/2022 Medical Rec #:  [id number]        Height:       64.0 in Accession #:    [id number]       Weight:       137.2 lb Date of Birth:  [birth year]        BSA:          1.667 m Patient Age:    61 years         BP:           148/86 mmHg Patient Gender: F                HR:           95 bpm. Exam Location:  [place]  Procedure: 2D Echo, Cardiac Doppler, Color Doppler and Strain Analysis  Indications:    Superior mesenteric artery stenosis (HCC) [K55.1 (ICD-10-CM)]; Angina pectoris (HCC) [I20.9 (ICD-10-CM)]  History:        Patient has no prior history of Echocardiogram examinations. Signs/Symptoms:Chest Pain.  Sonographer:    Margreta Journey RDCS Referring Phys: Rito Ehrlich [facility]   Sonographer Comments: Image acquisition challenging due to respiratory motion. IMPRESSIONS   1. Left ventricular ejection fraction, by estimation, is 60 to 65%. The left ventricle has normal function. The left ventricle has no regional wall motion abnormalities. Left ventricular diastolic parameters were normal. 2. Right ventricular systolic function is normal. The right ventricular size is normal. 3. The mitral valve is normal in structure. Mild mitral valve regurgitation. No evidence of mitral stenosis. 4. The aortic valve is normal in structure. Aortic valve regurgitation is not visualized. No aortic stenosis is present. 5.  The inferior vena cava is normal in size with greater than 50% respiratory variability, suggesting right atrial pressure of 3 mmHg.  FINDINGS Left Ventricle: Left ventricular ejection fraction, by estimation, is 60 to 65%. The left ventricle has normal function. The left ventricle has no regional wall motion abnormalities. The left ventricular internal cavity size was normal in size. There is no left ventricular hypertrophy. Left ventricular diastolic parameters were normal.  Right Ventricle: The right ventricular size is normal. No increase in right ventricular wall thickness. Right ventricular systolic function is normal.  Left Atrium: Left atrial size was normal in size.  Right Atrium: Right atrial size was normal in size.  Pericardium: There is no evidence of pericardial effusion.  Mitral Valve: The mitral valve is normal in structure. Mild mitral valve regurgitation.  No evidence of mitral valve stenosis.  Tricuspid Valve: The tricuspid valve is normal in structure. Tricuspid valve regurgitation is not demonstrated. No evidence of tricuspid stenosis.  Aortic Valve: The aortic valve is normal in structure. Aortic valve regurgitation is not visualized. No aortic stenosis is present.  Pulmonic Valve: The pulmonic valve was normal in structure. Pulmonic valve regurgitation is not visualized. No evidence of pulmonic stenosis.  Aorta: The aortic root is normal in size and structure.  Venous: The inferior vena cava is normal in size with greater than 50% respiratory variability, suggesting right atrial pressure of 3 mmHg.  IAS/Shunts: No atrial level shunt detected by color flow Doppler.   LEFT VENTRICLE PLAX 2D LVIDd:         3.80 cm Diastology LVIDs:         3.20 cm LV e' medial:    7.07 cm/s LV PW:         0.80 cm LV E/e' medial:  8.3 LV IVS:        1.10 cm LV e' lateral:   8.16 cm/s LV E/e' lateral: 7.2   RIGHT VENTRICLE             IVC RV Basal diam:  2.80 cm     IVC diam:  1.00 cm RV S prime:     16.40 cm/s TAPSE (M-mode): 2.3 cm  LEFT ATRIUM             Index        RIGHT ATRIUM          Index LA diam:        1.40 cm 0.84 cm/m   RA Area:     9.44 cm LA Vol (A2C):   21.1 ml 12.66 ml/m  RA Volume:   20.30 ml 12.18 ml/m LA Vol (A4C):   13.0 ml 7.80 ml/m LA Biplane Vol: 18.2 ml 10.92 ml/m AORTIC VALVE LVOT Vmax:   116.00 cm/s LVOT Vmean:  82.200 cm/s LVOT VTI:    0.255 m  AORTA Ao Root diam: 3.30 cm Ao Asc diam:  2.60 cm Ao Desc diam: 1.50 cm  MITRAL VALVE MV Area (PHT): 3.27 cm    SHUNTS MV Decel Time: 232 msec    Systemic VTI: 0.26 m MV E velocity: 58.70 cm/s MV A velocity: 62.10 cm/s MV E/A ratio:  0.95  Gypsy Balsam MD Electronically signed by Gypsy Balsam MD Signature Date/Time: 03/28/2022/2:27:03 PM    Final     CT SCANS  CT CORONARY MORPH W/CTA COR W/SCORE 04/04/2022  Addendum 04/05/2022 12:40 PM ADDENDUM REPORT: 04/05/2022 12:37  CLINICAL DATA:  cp  EXAM: Cardiac/Coronary  CTA  TECHNIQUE: The patient was scanned on a [employer].  FINDINGS: A 120 kV prospective scan was triggered in the descending thoracic aorta at 111 HU's. Axial non-contrast 3 mm slices were carried out through the heart. The data set was analyzed on a dedicated work station and scored using the Agatson method. Gantry rotation speed was 250 msecs and collimation was .6 mm. No beta blockade and 0.8 mg of sl NTG was given. The 3D data set was reconstructed in 5% intervals of the 67-82 % of the R-R cycle. Diastolic phases were analyzed on a dedicated work station using MPR, MIP and VRT modes. The patient received 80 cc of contrast.  Aorta: Normal size. Minimal calcifications of the descending thoracic aorta. No dissection.  Aortic Valve:  Trileaflet.  No calcifications.  Coronary Arteries:  Normal coronary origin.  Right dominance.  RCA is a large dominant artery that gives rise to PDA and PLA. In the proximal and mid portion  of this artery there are few calcified plaques with worst stenosis in the mid portion of RCA with mild stenosis of 25-49%.  Left main is a short large artery that gives rise to LAD and LCX arteries as well as moderate size Intermediate branch.  LAD is a large vessel that has small plaques with minimal stenosis of 0-25% in proximal and mid portion. This artery gives rise to small D1, mod D2, small D3 and large D4. D4 and distal LAD is very tortuous.  LCX is a non-dominant artery that gives rise to one small OM1 and large OM2 branch. There is small plaque with minimal stenosis (0-25%) in the proximal potion of CX.  Other findings:  Normal pulmonary vein drainage into the left atrium.  Normal left atrial appendage without a thrombus.  Normal size of the pulmonary artery.  IMPRESSION: 1. Coronary calcium score of 89.1. This was 90 percentile for age and sex matched control.  2. Normal coronary origin with right dominance.  3. CAD-RADS 2. Mild non-obstructive CAD (25-49%) (worst stenosis - RCA 25-49%). Consider non-atherosclerotic causes of chest pain. Consider preventive therapy and risk factor modification.   Electronically Signed By: Gypsy Balsam M.D. On: 04/05/2022 12:37  Narrative EXAM: OVER-READ INTERPRETATION  CT CHEST  The following report is an over-read performed by radiologist Dr. Carey Bullocks of [facility] on 04/04/2022. This over-read does not include interpretation of cardiac or coronary anatomy or pathology. The coronary CTA interpretation by the cardiologist is attached.  COMPARISON:  Chest CT 02/03/2022.  FINDINGS: Mediastinum/Nodes: There are no enlarged lymph nodes within the visualized mediastinum.  Lungs/Pleura: There is no pleural effusion. Stable calcified left lower lobe granuloma. The lungs are otherwise clear. Mild emphysema and chronic central airway thickening.  Upper abdomen: No significant findings in the  visualized upper abdomen.  Musculoskeletal/Chest wall: No chest wall mass or suspicious osseous findings within the visualized chest.  IMPRESSION: No significant extracardiac findings within the visualized lower chest. Mild Emphysema (ICD10-J43.9).  Electronically Signed: By: Carey Bullocks M.D. On: 04/04/2022 17:01              Recent Labs: 04/05/2023: ALT 6; BUN 8; Creatinine, Ser 0.91; Hemoglobin 14.0; Platelets 336; Potassium 5.0; Sodium 140  Recent Lipid Panel    Component Value Date/Time   CHOL 173 04/05/2023 1033   TRIG 125 04/05/2023 1033   HDL 70 04/05/2023 1033   CHOLHDL 2.5 04/05/2023 1033   LDLCALC 81 04/05/2023 1033    Physical Exam:    VS:  There were no vitals taken for this visit.    Wt Readings from Last 3 Encounters:  05/22/23 130 lb 12.8 oz (59.3 kg)  04/19/23 129 lb (58.5 kg)  04/05/23 129 lb (58.5 kg)     GEN: *** Well nourished, well developed in no acute distress HEENT: Normal NECK: No JVD; No carotid bruits LYMPHATICS: No lymphadenopathy CARDIAC: ***RRR, no murmurs, rubs, gallops RESPIRATORY:  Clear to auscultation without rales, wheezing or rhonchi  ABDOMEN: Soft, non-tender, non-distended MUSCULOSKELETAL:  No edema; No deformity  SKIN: Warm and dry NEUROLOGIC:  Alert and oriented x 3 PSYCHIATRIC:  Normal affect    Signed, Norman Herrlich, MD  06/26/2023 12:01 PM    [facility]

## 2023-06-27 ENCOUNTER — Ambulatory Visit: Payer: Medicare HMO | Admitting: Cardiology

## 2023-06-27 ENCOUNTER — Encounter: Payer: Self-pay | Admitting: Cardiology

## 2023-06-27 ENCOUNTER — Ambulatory Visit: Payer: Medicare HMO | Attending: Cardiology

## 2023-06-27 VITALS — BP 130/80 | HR 72 | Ht 64.0 in | Wt 132.0 lb

## 2023-06-27 DIAGNOSIS — I2510 Atherosclerotic heart disease of native coronary artery without angina pectoris: Secondary | ICD-10-CM

## 2023-06-27 DIAGNOSIS — I739 Peripheral vascular disease, unspecified: Secondary | ICD-10-CM | POA: Diagnosis not present

## 2023-06-27 DIAGNOSIS — J439 Emphysema, unspecified: Secondary | ICD-10-CM | POA: Diagnosis not present

## 2023-06-27 DIAGNOSIS — E782 Mixed hyperlipidemia: Secondary | ICD-10-CM | POA: Diagnosis not present

## 2023-06-27 LAB — VAS US ABI WITH/WO TBI
Left ABI: 1
Right ABI: 0.99

## 2023-06-27 MED ORDER — DILTIAZEM HCL ER COATED BEADS 180 MG PO CP24: 180.0000 mg | ORAL_CAPSULE | Freq: Every day | ORAL | 3 refills | Status: DC

## 2023-06-27 MED ORDER — BEMPEDOIC ACID 180 MG PO TABS: 180.0000 mg | ORAL_TABLET | ORAL | 3 refills | Status: DC

## 2023-06-27 NOTE — Patient Instructions (Signed)
Medication Instructions:  Your physician has recommended you make the following change in your medication:   START: Cardizem CD 180 mg daily START: Bempedoic Acid 180 mg every M-W-F STOP: Zetia  *If you need a refill on your cardiac medications before your next appointment, please call your pharmacy*   Lab Work: None If you have labs (blood work) drawn today and your tests are completely normal, you will receive your results only by: MyChart Message (if you have MyChart) OR A paper copy in the mail If you have any lab test that is abnormal or we need to change your treatment, we will call you to review the results.   Testing/Procedures: None   Follow-Up: At [facility], you and your health needs are our priority.  As part of our continuing mission to provide you with exceptional heart care, we have created designated Provider Care Teams.  These Care Teams include your primary Cardiologist (physician) and Advanced Practice Providers (APPs -  Physician Assistants and Nurse Practitioners) who all work together to provide you with the care you need, when you need it.  We recommend signing up for the patient portal called "MyChart".  Sign up information is provided on this After Visit Summary.  MyChart is used to connect with patients for Virtual Visits (Telemedicine).  Patients are able to view lab/test results, encounter notes, upcoming appointments, etc.  Non-urgent messages can be sent to your provider as well.   To learn more about what you can do with MyChart, go to [email or web address].    Your next appointment:   6 week(s)  Provider:   Norman Herrlich, MD    Other Instructions None

## 2023-07-02 DIAGNOSIS — M542 Cervicalgia: Secondary | ICD-10-CM | POA: Diagnosis not present

## 2023-07-02 DIAGNOSIS — G35 Multiple sclerosis: Secondary | ICD-10-CM | POA: Diagnosis not present

## 2023-07-02 DIAGNOSIS — M5412 Radiculopathy, cervical region: Secondary | ICD-10-CM | POA: Diagnosis not present

## 2023-07-02 DIAGNOSIS — M51362 Other intervertebral disc degeneration, lumbar region with discogenic back pain and lower extremity pain: Secondary | ICD-10-CM | POA: Diagnosis not present

## 2023-07-03 ENCOUNTER — Telehealth: Payer: Self-pay

## 2023-07-03 DIAGNOSIS — I739 Peripheral vascular disease, unspecified: Secondary | ICD-10-CM

## 2023-07-03 DIAGNOSIS — K551 Chronic vascular disorders of intestine: Secondary | ICD-10-CM

## 2023-07-03 NOTE — Telephone Encounter (Signed)
Viewed in MyChart Routed to PCP  

## 2023-07-03 NOTE — Telephone Encounter (Signed)
-----   Message from [place] Revankar sent at 06/28/2023  7:27 AM EDT ----- Best to get a CT angiography of lower extremities as the ABI is normal.  Not sure if she would need a recent creatinine.  Last 1 was fine.  I will check with radiology.  Copy primary care Garwin Brothers, MD 06/28/2023 7:25 AM

## 2023-07-04 ENCOUNTER — Telehealth: Payer: Self-pay | Admitting: Pharmacy Technician

## 2023-07-04 ENCOUNTER — Other Ambulatory Visit (HOSPITAL_COMMUNITY): Payer: Self-pay

## 2023-07-04 NOTE — Telephone Encounter (Signed)
Pharmacy Patient Advocate Encounter   Received notification from CoverMyMeds that prior authorization for nexletol is required/requested.   Insurance verification completed.   The patient is insured through [employer] .   Per test claim: PA required; PA submitted to above mentioned insurance via CoverMyMeds Key/confirmation #/EOC [id number] Status is pending

## 2023-07-04 NOTE — Telephone Encounter (Signed)
Pharmacy Patient Advocate Encounter  Received notification from [facility] [employer] that Prior Authorization for nexletol has been APPROVED from 09/04/22 to 09/03/24   PA #/Case ID/Reference #: [id number]

## 2023-07-10 ENCOUNTER — Other Ambulatory Visit: Payer: Self-pay

## 2023-07-10 MED ORDER — ALBUTEROL SULFATE HFA 108 (90 BASE) MCG/ACT IN AERS: 2.0000 | INHALATION_SPRAY | Freq: Four times a day (QID) | RESPIRATORY_TRACT | 3 refills | Status: AC | PRN

## 2023-07-17 NOTE — Assessment & Plan Note (Signed)
Seen at pain clinic.  °

## 2023-07-17 NOTE — Assessment & Plan Note (Signed)
Well controlled.  ?No changes to medicines.  ?Continue to work on eating a healthy diet and exercise.  ?Labs drawn today.  ?

## 2023-07-17 NOTE — Assessment & Plan Note (Signed)
Controlled with omeprazole 40 mg daily.  

## 2023-07-17 NOTE — Assessment & Plan Note (Signed)
Stented. Follow up with cardiology and interventional radiology.

## 2023-07-17 NOTE — Progress Notes (Unsigned)
Subjective:  Patient ID: Christina Cantrell, female    DOB: Jun 20, 1961  Age: 62 y.o. MRN: [id number]  Chief Complaint  Patient presents with   Medical Management of Chronic Issues    HPI Patient is a 62 year old white female with multiple sclerosis, hyperlipidemia, depression who presents for chronic follow-up.  Patient has seen cardiology and interventional radiology. Patient's SMA was stented.  Hyperlipidemia: Not on anything for cholesterol.  Patient is also on aspirin 81 mg daily and plavix 75 mg daily. Headache, brain fog, and urinary retention resolved completely about one month after discontinuation of repatha. Was also having hair loss and is improving. Did not tolerate crestor due to myalgias, but is willing to try a lower dose.  Nexlitol - took one and left knee swelled up. Patient did not go to see a doctor. Has not tried zetia, but has a prescription.  Eating healthy and exercising  Patient was recommended to take b12 shots monthly.  Depression: She is taking Celexa 40 mg daily.  MS: advanced. Not on any current medications for MS. Does not see neurology. Had vision issues: eye pain and blurry vision, now resolved having come off repatha.   Opioid dependence: patient sees pain clinic. On percocet 10/325 mg q4 hours as needed. Duragesic patches 75 mcg once every 48 hours. On flexeril 10 mg three times a day as needed.      07/18/2023    9:03 AM 04/05/2023   10:27 AM 12/18/2022   11:26 AM 10/04/2021   10:39 AM 09/16/2020    9:54 AM  Depression screen PHQ 2/9  Decreased Interest 0 0 0 0 2  Down, Depressed, Hopeless 0 0 0 0 2  PHQ - 2 Score 0 0 0 0 4  Altered sleeping 3 1  0 3  Tired, decreased energy 3 3  0 3  Change in appetite 0 0  1   Feeling bad or failure about yourself  0 0  0 1  Trouble concentrating 0 0  0 0  Moving slowly or fidgety/restless 0 0  0 0  Suicidal thoughts 0 0  0 0  PHQ-9 Score 6 4  1 11   Difficult doing work/chores Somewhat difficult Not difficult  at all  Not difficult at all         04/19/2023    2:11 PM  Fall Risk   Falls in the past year? 0  Number falls in past yr: 0  Injury with Fall? 0  Risk for fall due to : History of fall(s)  Follow up Falls evaluation completed;Education provided    Patient Care Team: Blane Ohara, MD as PCP - General (Family Medicine) Baldo Daub, MD as Consulting Physician (Cardiology) Candace Gallus as Physician Assistant (Neurosurgery) Misenheimer, Marcial Pacas, MD as Consulting Physician (Unknown Physician Specialty) Roxanne Gates, OD (Optometry) Cherly Beach, MD as Referring Physician (Family Medicine)   Review of Systems  Constitutional:  Negative for chills, fatigue and fever.  HENT:  Positive for congestion and ear pain. Negative for rhinorrhea and sore throat.   Respiratory:  Negative for cough and shortness of breath.   Cardiovascular:  Negative for chest pain.  Gastrointestinal:  Positive for constipation. Negative for abdominal pain, diarrhea, nausea and vomiting.  Genitourinary:  Negative for dysuria and urgency.  Neurological:  Negative for dizziness, weakness, light-headedness and headaches.  Psychiatric/Behavioral:  Negative for dysphoric mood. The patient is not nervous/anxious.     Current Outpatient Medications on File Prior to  Visit  Medication Sig Dispense Refill   albuterol (VENTOLIN HFA) 108 (90 Base) MCG/ACT inhaler Inhale 2 puffs into the lungs every 6 (six) hours as needed for wheezing or shortness of breath. 18 g 3   aspirin EC 81 MG tablet Take 81 mg by mouth daily. Swallow whole.     citalopram (CELEXA) 40 MG tablet Take 40 mg by mouth daily.     cyanocobalamin (VITAMIN B12) 1000 MCG/ML injection INJECT 1 ML (1,000 MCG TOTAL) INTO THE MUSCLE EVERY 30 DAYS. 3 mL 1   cyclobenzaprine (FLEXERIL) 10 MG tablet Take 10 mg by mouth 3 (three) times daily as needed for muscle spasms.     diltiazem (CARDIZEM CD) 180 MG 24 hr capsule Take 1 capsule (180  mg total) by mouth daily. 90 capsule 3   doxycycline (PERIOSTAT) 20 MG tablet Take 20 mg by mouth 2 (two) times daily.     fentaNYL (DURAGESIC) 75 MCG/HR Place 1 patch onto the skin 2 days.     metroNIDAZOLE (METROCREAM) 0.75 % cream Apply 1 Application topically 2 (two) times daily.     nitroGLYCERIN (NITROSTAT) 0.4 MG SL tablet Place 0.4 mg under the tongue every 5 (five) minutes as needed for chest pain.     norethindrone-ethinyl estradiol (FYAVOLV) 1-5 MG-MCG TABS tablet Take 1 tablet by mouth daily.     omeprazole (PRILOSEC) 40 MG capsule TAKE 1 CAPSULE (40 MG TOTAL) BY MOUTH DAILY. 90 capsule 1   oxyCODONE-acetaminophen (PERCOCET) 10-325 MG tablet Take 1 tablet by mouth every 4 (four) hours as needed for pain.     Polyethyl Glycol-Propyl Glycol (SYSTANE OP) Place 1 drop into both eyes daily as needed (dry eyes).     RELISTOR 150 MG TABS Take 3 tablets by mouth every morning.     Vitamin D, Ergocalciferol, (DRISDOL) 1.25 MG (50000 UNIT) CAPS capsule TAKE 1 CAPSULE (50,000 UNITS TOTAL) BY MOUTH TWO TIMES A WEEK 24 capsule 1   No current facility-administered medications on file prior to visit.   Past Medical History:  Diagnosis Date   Angina pectoris (HCC) 03/16/2022   B12 deficiency 09/13/2022   Central sleep apnea comorbid with prescribed opioid use, moderate 10/30/2022   Central sleep apnea syndrome 05/30/2018   Centrilobular emphysema (HCC) 10/04/2021   Cervical radiculopathy 07/21/2021   Chronic low back pain 06/04/2013   Degeneration of lumbar intervertebral disc 06/04/2013   Epigastric pain 10/04/2021   Esophageal dysphagia 02/13/2022   Ex-smoker 03/16/2022   Familial hypercholesterolemia 12/22/2022   Familial hyperlipidemia 06/27/2021   GERD without esophagitis 09/13/2022   Localized osteoporosis without current pathological fracture 06/27/2021   Mixed hyperlipidemia 06/27/2021   Multiple sclerosis (HCC) 1991   Age 45 yo   Need for influenza vaccination 05/28/2022    Neuropathic pain 11/12/2017   Optic neuritis 09/17/2018   Osteoarthritis 09/17/2018   Osteopenia 11/20/2018   Other chest pain 02/18/2022   Pain in thoracic spine 06/04/2013   Pain of upper abdomen 10/04/2021   Paresthesia 02/13/2022   Polyuria 02/18/2022   Screening for lung cancer 12/29/2021   Spinal stenosis 09/17/2018   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   Therapeutic opioid induced constipation 02/18/2022   Therapeutic opioid-induced constipation (OIC) 02/18/2022   Uncomplicated opioid dependence (HCC) 09/12/2021   Vitamin B12 deficiency 05/28/2022   Vitamin D deficiency 06/27/2021   Past Surgical History:  Procedure Laterality Date   BREAST BIOPSY Bilateral 08/24/2020   BREAST EXCISIONAL BIOPSY Right 1975  IR ANGIOGRAM VISCERAL SELECTIVE  03/02/2022   IR RADIOLOGIST EVAL & MGMT  02/21/2022   IR RADIOLOGIST EVAL & MGMT  04/03/2022   IR RADIOLOGIST EVAL & MGMT  06/12/2022   IR RADIOLOGIST EVAL & MGMT  05/09/2023   IR TRANSCATH PLC STENT 1ST ART NOT LE CV CAR VERT CAR  03/02/2022   IR US GUIDE VASC ACCESS RIGHT  03/02/2022    Family History  Family history unknown: Yes   Social History   Socioeconomic History   Marital status: Married    Spouse name: Molly Maduro   Number of children: 1   Years of education: Not on file   Highest education level: Associate degree: occupational, Scientist, product/process development, or vocational program  Occupational History   Not on file  Tobacco Use   Smoking status: Former    Current packs/day: 0.00    Types: Cigarettes    Start date: 1998    Quit date: 2018    Years since quitting: 6.8   Smokeless tobacco: Never  Vaping Use   Vaping status: Never Used  Substance and Sexual Activity   Alcohol use: Never   Drug use: Never   Sexual activity: Not on file  Other Topics Concern   Not on file  Social History Narrative   Not on file   Social Determinants of Health   Financial Resource Strain: Low Risk   (07/18/2023)   Overall Financial Resource Strain (CARDIA)    Difficulty of Paying Living Expenses: Not very hard  Food Insecurity: No Food Insecurity (07/18/2023)   Hunger Vital Sign    Worried About Running Out of Food in the Last Year: Never true    Ran Out of Food in the Last Year: Never true  Transportation Needs: No Transportation Needs (08/31/2022)   PRAPARE - Administrator, Civil Service (Medical): No    Lack of Transportation (Non-Medical): No  Physical Activity: Insufficiently Active (07/18/2023)   Exercise Vital Sign    Days of Exercise per Week: 1 day    Minutes of Exercise per Session: 30 min  Stress: No Stress Concern Present (07/18/2023)   [employer] of Occupational Health - Occupational Stress Questionnaire    Feeling of Stress : Not at all  Social Connections: Socially Isolated (07/18/2023)   Social Connection and Isolation Panel [NHANES]    Frequency of Communication with Friends and Family: Once a week    Frequency of Social Gatherings with Friends and Family: Once a week    Attends Religious Services: Never    Database administrator or Organizations: No    Attends Engineer, structural: Never    Marital Status: Married    Objective:  BP 132/70   Pulse 71   Temp 97.6 F (36.4 C)   Ht 5\' 4"  (1.626 m)   Wt 134 lb (60.8 kg)   SpO2 98%   BMI 23.00 kg/m      07/18/2023    8:58 AM 06/27/2023    3:46 PM 05/22/2023    2:27 PM  BP/Weight  Systolic BP 132 130 120  Diastolic BP 70 80 86  Wt. (Lbs) 134 132 130.8  BMI 23 kg/m2 22.66 kg/m2 22.45 kg/m2    Physical Exam Vitals reviewed.  Constitutional:      Appearance: Normal appearance. She is normal weight.  HENT:     Right Ear: Tympanic membrane normal.     Left Ear: Tympanic membrane normal.     Nose: Nose normal.  Mouth/Throat:     Pharynx: No oropharyngeal exudate or posterior oropharyngeal erythema.  Neck:     Vascular: No carotid bruit.  Cardiovascular:     Rate  and Rhythm: Normal rate and regular rhythm.     Heart sounds: Normal heart sounds.  Pulmonary:     Effort: Pulmonary effort is normal. No respiratory distress.     Breath sounds: Normal breath sounds.  Abdominal:     General: Abdomen is flat. Bowel sounds are normal.     Palpations: Abdomen is soft.     Tenderness: There is no abdominal tenderness.  Neurological:     Mental Status: She is alert and oriented to person, place, and time.  Psychiatric:        Mood and Affect: Mood normal.        Behavior: Behavior normal.     Diabetic Foot Exam - Simple   No data filed      Lab Results  Component Value Date   WBC 5.6 07/18/2023   HGB 14.1 07/18/2023   HCT 43.5 07/18/2023   PLT 328 07/18/2023   GLUCOSE 86 07/18/2023   CHOL 222 (H) 07/18/2023   TRIG 89 07/18/2023   HDL 79 07/18/2023   LDLCALC 128 (H) 07/18/2023   ALT 10 07/18/2023   AST 15 07/18/2023   NA 140 07/18/2023   K 4.5 07/18/2023   CL 103 07/18/2023   CREATININE 0.86 07/18/2023   BUN 15 07/18/2023   CO2 23 07/18/2023   TSH 3.150 07/18/2023   INR 1.0 03/02/2022      Assessment & Plan:    Mixed hyperlipidemia Assessment & Plan: Well controlled.  No changes to medicines.  Continue to work on eating a healthy diet and exercise.  Labs drawn today.    Orders: -     Comprehensive metabolic panel -     Lipid panel -     TSH  Uncomplicated opioid dependence (HCC) Assessment & Plan: Seen at pain clinic.    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 Follow up with cardiology and interventional radiology.   Orders: -     Clopidogrel Bisulfate; Take 1 tablet (75 mg total) by mouth daily.  Dispense: 30 tablet; Refill: 2  B12 deficiency -     Cyanocobalamin -     Vitamin B12  History of tobacco abuse -     CT CHEST LUNG CANCER SCREENING LOW  DOSE WO CONTRAST  Encounter for immunization -     Pneumococcal conjugate vaccine 20-valent     Meds ordered this encounter  Medications   clopidogrel (PLAVIX) 75 MG tablet    Sig: Take 1 tablet (75 mg total) by mouth daily.    Dispense:  30 tablet    Refill:  2   cyanocobalamin (VITAMIN B12) injection 1,000 mcg    Orders Placed This Encounter  Procedures   CT CHEST LUNG CANCER SCREENING LOW DOSE WO CONTRAST   Pneumococcal conjugate vaccine 20-valent   CBC with Differential/Platelet   Comprehensive metabolic panel   Lipid panel   Vitamin B12   TSH    Total time spent on today's visit was greater than 30 minutes, including both face-to-face time and nonface-to-face time personally spent on review of chart (labs and imaging), discussing labs and goals, discussing further work-up, treatment options, referrals to specialist if needed, reviewing  outside records of pertinent, answering patient's questions, and coordinating care.  Follow-up: Return in about 3 months (around 10/18/2023) for chronic fasting.   I,Marla I Leal-Borjas,acting as a scribe for Blane Ohara, MD.,have documented all relevant documentation on the behalf of Blane Ohara, MD,as directed by  Blane Ohara, MD while in the presence of Blane Ohara, MD.   An After Visit Summary was printed and given to the patient.  Blane Ohara, MD [facility] [phone]

## 2023-07-18 ENCOUNTER — Ambulatory Visit (INDEPENDENT_AMBULATORY_CARE_PROVIDER_SITE_OTHER): Payer: Medicare HMO | Admitting: Family Medicine

## 2023-07-18 VITALS — BP 132/70 | HR 71 | Temp 97.6°F | Ht 64.0 in | Wt 134.0 lb

## 2023-07-18 DIAGNOSIS — F1120 Opioid dependence, uncomplicated: Secondary | ICD-10-CM

## 2023-07-18 DIAGNOSIS — Z87891 Personal history of nicotine dependence: Secondary | ICD-10-CM | POA: Diagnosis not present

## 2023-07-18 DIAGNOSIS — E538 Deficiency of other specified B group vitamins: Secondary | ICD-10-CM

## 2023-07-18 DIAGNOSIS — E782 Mixed hyperlipidemia: Secondary | ICD-10-CM

## 2023-07-18 DIAGNOSIS — K551 Chronic vascular disorders of intestine: Secondary | ICD-10-CM

## 2023-07-18 DIAGNOSIS — G35 Multiple sclerosis: Secondary | ICD-10-CM

## 2023-07-18 DIAGNOSIS — K219 Gastro-esophageal reflux disease without esophagitis: Secondary | ICD-10-CM

## 2023-07-18 DIAGNOSIS — Z23 Encounter for immunization: Secondary | ICD-10-CM

## 2023-07-18 MED ORDER — CLOPIDOGREL BISULFATE 75 MG PO TABS
75.0000 mg | ORAL_TABLET | Freq: Every day | ORAL | 2 refills | Status: DC
Start: 2023-07-18 — End: 2023-08-13

## 2023-07-18 MED ORDER — CYANOCOBALAMIN 1000 MCG/ML IJ SOLN
1000.0000 ug | Freq: Once | INTRAMUSCULAR | Status: AC
  Administered 2023-07-18: 1000 ug via INTRAMUSCULAR

## 2023-07-18 NOTE — Patient Instructions (Signed)
Recommend get the RSV vaccine and the Shingrix vaccine series at the pharmacy.  Start Zetia 10 mg nightly. Anticipate starting rosuvastatin (Crestor) 20 mg half pill every other day. Recommend coenzyme every 10 daily.

## 2023-07-19 LAB — COMPREHENSIVE METABOLIC PANEL
ALT: 10 [IU]/L (ref 0–32)
AST: 15 [IU]/L (ref 0–40)
Albumin: 4.3 g/dL (ref 3.8–4.9)
Alkaline Phosphatase: 90 [IU]/L (ref 44–121)
BUN/Creatinine Ratio: 17 (ref 12–28)
BUN: 15 mg/dL (ref 8–27)
Bilirubin Total: 0.4 mg/dL (ref 0.0–1.2)
CO2: 23 mmol/L (ref 20–29)
Calcium: 9.3 mg/dL (ref 8.7–10.3)
Chloride: 103 mmol/L (ref 96–106)
Creatinine, Ser: 0.86 mg/dL (ref 0.57–1.00)
Globulin, Total: 2.1 g/dL (ref 1.5–4.5)
Glucose: 86 mg/dL (ref 70–99)
Potassium: 4.5 mmol/L (ref 3.5–5.2)
Sodium: 140 mmol/L (ref 134–144)
Total Protein: 6.4 g/dL (ref 6.0–8.5)
eGFR: 77 mL/min/{1.73_m2} (ref >59)

## 2023-07-19 LAB — VITAMIN B12: Vitamin B-12: >2000 pg/mL — ABNORMAL HIGH (ref 232–1245)

## 2023-07-19 LAB — CBC WITH DIFFERENTIAL/PLATELET
Basophils Absolute: 0 10*3/uL (ref 0.0–0.2)
Basos: 1 %
EOS (ABSOLUTE): 0.1 10*3/uL (ref 0.0–0.4)
Eos: 2 %
Hematocrit: 43.5 % (ref 34.0–46.6)
Hemoglobin: 14.1 g/dL (ref 11.1–15.9)
Immature Grans (Abs): 0 10*3/uL (ref 0.0–0.1)
Immature Granulocytes: 1 %
Lymphocytes Absolute: 1.7 10*3/uL (ref 0.7–3.1)
Lymphs: 30 %
MCH: 31.1 pg (ref 26.6–33.0)
MCHC: 32.4 g/dL (ref 31.5–35.7)
MCV: 96 fL (ref 79–97)
Monocytes Absolute: 0.3 10*3/uL (ref 0.1–0.9)
Monocytes: 5 %
Neutrophils Absolute: 3.5 10*3/uL (ref 1.4–7.0)
Neutrophils: 61 %
Platelets: 328 10*3/uL (ref 150–450)
RBC: 4.54 x10E6/uL (ref 3.77–5.28)
RDW: 12.7 % (ref 11.7–15.4)
WBC: 5.6 10*3/uL (ref 3.4–10.8)

## 2023-07-19 LAB — LIPID PANEL
Chol/HDL Ratio: 2.8 ratio (ref 0.0–4.4)
Cholesterol, Total: 222 mg/dL — ABNORMAL HIGH (ref 100–199)
HDL: 79 mg/dL (ref >39)
LDL Chol Calc (NIH): 128 mg/dL — ABNORMAL HIGH (ref 0–99)
Triglycerides: 89 mg/dL (ref 0–149)
VLDL Cholesterol Cal: 15 mg/dL (ref 5–40)

## 2023-07-19 LAB — TSH: TSH: 3.15 u[IU]/mL (ref 0.450–4.500)

## 2023-07-20 ENCOUNTER — Telehealth: Payer: Self-pay | Admitting: Family Medicine

## 2023-07-20 ENCOUNTER — Ambulatory Visit (HOSPITAL_BASED_OUTPATIENT_CLINIC_OR_DEPARTMENT_OTHER)
Admission: RE | Admit: 2023-07-20 | Discharge: 2023-07-20 | Disposition: A | Discharge status: Home / Self Care | Payer: Medicare HMO | Source: Ambulatory Visit | Attending: Cardiology | Admitting: Cardiology

## 2023-07-20 DIAGNOSIS — K551 Chronic vascular disorders of intestine: Secondary | ICD-10-CM | POA: Insufficient documentation

## 2023-07-20 DIAGNOSIS — I739 Peripheral vascular disease, unspecified: Secondary | ICD-10-CM | POA: Insufficient documentation

## 2023-07-20 DIAGNOSIS — I700 Atherosclerosis of aorta: Secondary | ICD-10-CM | POA: Diagnosis not present

## 2023-07-20 MED ORDER — IOHEXOL 350 MG/ML SOLN
100.0000 mL | Freq: Once | INTRAVENOUS | Status: AC | PRN
  Administered 2023-07-20: 100 mL via INTRAVENOUS

## 2023-07-20 NOTE — Telephone Encounter (Signed)
   Christina Cantrell has been scheduled for the following appointment:  WHAT: CT LUNG SCREEN WHERE: [facility] DATE: 07/23/2023 TIME: 11:15 AM CHECK-IN  Patient has been made aware.

## 2023-07-23 DIAGNOSIS — Z87891 Personal history of nicotine dependence: Secondary | ICD-10-CM | POA: Diagnosis not present

## 2023-07-23 DIAGNOSIS — Z122 Encounter for screening for malignant neoplasm of respiratory organs: Secondary | ICD-10-CM | POA: Diagnosis not present

## 2023-07-24 ENCOUNTER — Encounter: Payer: Self-pay | Admitting: Family Medicine

## 2023-07-26 ENCOUNTER — Telehealth: Payer: Self-pay | Admitting: Cardiology

## 2023-07-26 NOTE — Telephone Encounter (Signed)
Patient calling to get results of the CT that was performed last Friday 11/15 on lower extremities   Thank you

## 2023-07-26 NOTE — Telephone Encounter (Signed)
Advised that CT has not been read by the radiologist at this time. Advised that once read and reviewed we will let her know. Pt verbalized understanding and had no additional questions.

## 2023-08-06 ENCOUNTER — Telehealth: Payer: Self-pay

## 2023-08-06 DIAGNOSIS — K551 Chronic vascular disorders of intestine: Secondary | ICD-10-CM

## 2023-08-06 DIAGNOSIS — E782 Mixed hyperlipidemia: Secondary | ICD-10-CM

## 2023-08-06 NOTE — Telephone Encounter (Signed)
-----   Message from Christina Cantrell sent at 08/01/2023 10:49 AM EST ----- We can try Nexlizet at for her. ----- Message ----- From: Eleonore Chiquito, RN Sent: 08/01/2023   8:15 AM EST To: Garwin Brothers, MD  She is allergic to statins and repatha. ----- Message ----- From: Garwin Brothers, MD Sent: 07/31/2023   8:53 AM EST To: Eleonore Chiquito, RN  Unremarkable with no blockages.  Lipids are high.  Ask her why she is not on statin.  She has seen [facility] in the past.  Initiate atorvastatin 20 mg daily and liver lipid check in 6 weeks.  Copy primary Garwin Brothers, MD 07/31/2023 8:53 AM

## 2023-08-06 NOTE — Telephone Encounter (Signed)
MyChart message

## 2023-08-06 NOTE — Telephone Encounter (Signed)
-----   Message from Aundra Dubin Revankar sent at 08/01/2023 10:49 AM EST ----- We can try Nexlizet at for her. ----- Message ----- From: Eleonore Chiquito, RN Sent: 08/01/2023   8:15 AM EST To: Garwin Brothers, MD  She is allergic to statins and repatha. ----- Message ----- From: Garwin Brothers, MD Sent: 07/31/2023   8:53 AM EST To: Eleonore Chiquito, RN  Unremarkable with no blockages.  Lipids are high.  Ask her why she is not on statin.  She has seen [facility] in the past.  Initiate atorvastatin 20 mg daily and liver lipid check in 6 weeks.  Copy primary Garwin Brothers, MD 07/31/2023 8:53 AM

## 2023-08-09 DIAGNOSIS — L209 Atopic dermatitis, unspecified: Secondary | ICD-10-CM | POA: Diagnosis not present

## 2023-08-09 DIAGNOSIS — L719 Rosacea, unspecified: Secondary | ICD-10-CM | POA: Diagnosis not present

## 2023-08-13 ENCOUNTER — Other Ambulatory Visit: Payer: Self-pay | Admitting: Family Medicine

## 2023-08-13 DIAGNOSIS — K551 Chronic vascular disorders of intestine: Secondary | ICD-10-CM

## 2023-08-15 ENCOUNTER — Ambulatory Visit: Payer: Medicare HMO | Admitting: Cardiology

## 2023-08-25 ENCOUNTER — Other Ambulatory Visit: Payer: Self-pay | Admitting: Family Medicine

## 2023-09-18 ENCOUNTER — Other Ambulatory Visit: Payer: Self-pay | Admitting: Interventional Radiology

## 2023-09-18 DIAGNOSIS — K551 Chronic vascular disorders of intestine: Secondary | ICD-10-CM

## 2023-09-20 ENCOUNTER — Ambulatory Visit: Payer: Medicare HMO

## 2023-09-21 ENCOUNTER — Other Ambulatory Visit: Payer: Self-pay | Admitting: Interventional Radiology

## 2023-09-21 ENCOUNTER — Inpatient Hospital Stay: Admission: RE | Admit: 2023-09-21 | Payer: Medicare HMO | Source: Ambulatory Visit

## 2023-09-21 ENCOUNTER — Ambulatory Visit
Admission: RE | Admit: 2023-09-21 | Discharge: 2023-09-21 | Disposition: A | Discharge status: Home / Self Care | Payer: Medicare HMO | Source: Ambulatory Visit | Attending: Interventional Radiology | Admitting: Interventional Radiology

## 2023-09-21 DIAGNOSIS — K551 Chronic vascular disorders of intestine: Secondary | ICD-10-CM

## 2023-09-21 DIAGNOSIS — K55059 Acute (reversible) ischemia of intestine, part and extent unspecified: Secondary | ICD-10-CM | POA: Diagnosis not present

## 2023-09-21 MED ORDER — IOPAMIDOL (ISOVUE-370) INJECTION 76%
500.0000 mL | Freq: Once | INTRAVENOUS | Status: AC | PRN
  Administered 2023-09-21: 80 mL via INTRAVENOUS

## 2023-09-27 ENCOUNTER — Ambulatory Visit
Admission: RE | Admit: 2023-09-27 | Discharge: 2023-09-27 | Disposition: A | Discharge status: Home / Self Care | Payer: Medicare HMO | Source: Ambulatory Visit | Attending: Interventional Radiology | Admitting: Interventional Radiology

## 2023-09-27 DIAGNOSIS — K551 Chronic vascular disorders of intestine: Secondary | ICD-10-CM | POA: Diagnosis not present

## 2023-09-27 DIAGNOSIS — Z95828 Presence of other vascular implants and grafts: Secondary | ICD-10-CM | POA: Diagnosis not present

## 2023-09-27 HISTORY — PX: IR RADIOLOGIST EVAL & MGMT: IMG5224

## 2023-09-27 NOTE — Progress Notes (Signed)
Chief Complaint: Patient was consulted remotely today (TeleHealth) for   History of Present Illness: Christina Cantrell is a 63 y.o. 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 She asked to have imaging and be seen due to some recurrent abdominal discomfort in December which she describes as "fullness" and discomfort with eating and drinking which has improved since December. She also has been dealing with significant constipation over the last 2 months for which she has tried Relistor with little help, Miralax and increased fruit and fiber intake. She has been dealing with some issues with her cholesterol treatment, as Repatha apparently caused some significant urinary side effects and she is now on Crestor only. She remains on both aspirin and Plavix daily. She denies severe abdominal pain, nausea or vomiting.  Past Medical History:  Diagnosis Date   Angina pectoris (HCC) 03/16/2022   B12 deficiency 09/13/2022   Central sleep apnea comorbid with prescribed opioid use, moderate 10/30/2022   Central sleep apnea syndrome 05/30/2018   Centrilobular emphysema (HCC) 10/04/2021   Cervical radiculopathy 07/21/2021   Chronic low back pain 06/04/2013   Degeneration of lumbar intervertebral disc 06/04/2013   Epigastric pain 10/04/2021   Esophageal dysphagia 02/13/2022   Ex-smoker 03/16/2022   Familial hypercholesterolemia 12/22/2022   Familial hyperlipidemia 06/27/2021   GERD without esophagitis 09/13/2022   Localized osteoporosis without current pathological fracture 06/27/2021   Mixed hyperlipidemia 06/27/2021   Multiple sclerosis (HCC) 1991   Age 53 yo   Need for influenza vaccination 05/28/2022   Neuropathic pain 11/12/2017   Optic neuritis 09/17/2018   Osteoarthritis 09/17/2018   Osteopenia 11/20/2018    Other chest pain 02/18/2022   Pain in thoracic spine 06/04/2013   Pain of upper abdomen 10/04/2021   Paresthesia 02/13/2022   Polyuria 02/18/2022   Screening for lung cancer 12/29/2021   Spinal stenosis 09/17/2018   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   Therapeutic opioid induced constipation 02/18/2022   Therapeutic opioid-induced constipation (OIC) 02/18/2022   Uncomplicated opioid dependence (HCC) 09/12/2021   Vitamin B12 deficiency 05/28/2022   Vitamin D deficiency 06/27/2021    Past Surgical History:  Procedure Laterality Date   BREAST BIOPSY Bilateral 08/24/2020   BREAST EXCISIONAL BIOPSY Right 1975   IR ANGIOGRAM VISCERAL SELECTIVE  03/02/2022   IR RADIOLOGIST EVAL & MGMT  02/21/2022   IR RADIOLOGIST EVAL & MGMT  04/03/2022   IR RADIOLOGIST EVAL & MGMT  06/12/2022   IR RADIOLOGIST EVAL & MGMT  05/09/2023   IR TRANSCATH PLC STENT 1ST ART NOT LE CV CAR VERT CAR  03/02/2022   IR US GUIDE VASC ACCESS RIGHT  03/02/2022    Allergies: Atorvastatin, Nexletol [bempedoic acid], Sertraline hcl, and Repatha [evolocumab]  Medications: Prior to Admission medications   Medication Sig Start Date End Date Taking? Authorizing Provider  albuterol (VENTOLIN HFA) 108 (90 Base) MCG/ACT inhaler Inhale 2 puffs into the lungs every 6 (six) hours as needed for wheezing or shortness of breath. 07/10/23   Blane Ohara, MD  aspirin EC 81 MG tablet Take 81 mg by mouth daily. Swallow whole.    [provider]  citalopram (CELEXA) 40 MG tablet Take 40 mg by mouth daily. 08/02/20   [provider]  clopidogrel (PLAVIX) 75 MG tablet TAKE 1 TABLET BY MOUTH EVERY DAY 08/13/23  Cox, Kirsten, MD  cyanocobalamin (VITAMIN B12) 1000 MCG/ML injection INJECT 1 ML (1,000 MCG TOTAL) INTO THE MUSCLE EVERY 30 DAYS. 10/26/22   Cox, Fritzi Mandes, MD  cyclobenzaprine (FLEXERIL) 10 MG tablet Take 10 mg by mouth 3 (three) times daily as needed for muscle spasms. 07/31/20    [provider]  diltiazem (CARDIZEM CD) 180 MG 24 hr capsule Take 1 capsule (180 mg total) by mouth daily. 06/27/23   Baldo Daub, MD  doxycycline (PERIOSTAT) 20 MG tablet Take 20 mg by mouth 2 (two) times daily. 12/07/21   [provider]  fentaNYL (DURAGESIC) 75 MCG/HR Place 1 patch onto the skin 2 days.    [provider]  metroNIDAZOLE (METROCREAM) 0.75 % cream Apply 1 Application topically 2 (two) times daily. 09/07/21   [provider]  nitroGLYCERIN (NITROSTAT) 0.4 MG SL tablet Place 0.4 mg under the tongue every 5 (five) minutes as needed for chest pain.    [provider]  norethindrone-ethinyl estradiol (FYAVOLV) 1-5 MG-MCG TABS tablet Take 1 tablet by mouth daily.    [provider]  omeprazole (PRILOSEC) 40 MG capsule TAKE 1 CAPSULE (40 MG TOTAL) BY MOUTH DAILY. 05/16/23   Cox, Fritzi Mandes, MD  oxyCODONE-acetaminophen (PERCOCET) 10-325 MG tablet Take 1 tablet by mouth every 4 (four) hours as needed for pain. 08/13/20   [provider]  Polyethyl Glycol-Propyl Glycol (SYSTANE OP) Place 1 drop into both eyes daily as needed (dry eyes).    [provider]  RELISTOR 150 MG TABS Take 3 tablets by mouth every morning.    [provider]  Vitamin D, Ergocalciferol, (DRISDOL) 1.25 MG (50000 UNIT) CAPS capsule TAKE 1 CAPSULE (50,000 UNITS TOTAL) BY MOUTH TWO TIMES A WEEK 08/27/23   Cox, Fritzi Mandes, MD     Family History  Family history unknown: Yes    Social History   Socioeconomic History   Marital status: Married    Spouse name: Molly Maduro   Number of children: 1   Years of education: Not on file   Highest education level: Associate degree: occupational, Scientist, product/process development, or vocational program  Occupational History   Not on file  Tobacco Use   Smoking status: Former    Current packs/day: 0.00    Types: Cigarettes    Start date: 1998    Quit date: 2018    Years since quitting: 7.0   Smokeless tobacco: Never   Vaping Use   Vaping status: Never Used  Substance and Sexual Activity   Alcohol use: Never   Drug use: Never   Sexual activity: Not on file  Other Topics Concern   Not on file  Social History Narrative   Not on file   Social Drivers of Health   Financial Resource Strain: Low Risk  (07/18/2023)   Overall Financial Resource Strain (CARDIA)    Difficulty of Paying Living Expenses: Not very hard  Food Insecurity: No Food Insecurity (07/18/2023)   Hunger Vital Sign    Worried About Running Out of Food in the Last Year: Never true    Ran Out of Food in the Last Year: Never true  Transportation Needs: No Transportation Needs (08/31/2022)   PRAPARE - Administrator, Civil Service (Medical): No    Lack of Transportation (Non-Medical): No  Physical Activity: Insufficiently Active (07/18/2023)   Exercise Vital Sign    Days of Exercise per Week: 1 day    Minutes of Exercise per Session: 30 min  Stress: No Stress Concern Present (  07/18/2023)   [employer] [employer] - Occupational Stress Questionnaire    Feeling of Stress : Not at all  Social Connections: Socially Isolated (07/18/2023)   Social Connection and Isolation Panel [NHANES]    Frequency of Communication with Friends and Family: Once a week    Frequency of Social Gatherings with Friends and Family: Once a week    Attends Religious Services: Never    Database administrator or Organizations: No    Attends Banker Meetings: Never    Marital Status: Married     Review of Systems  Constitutional: Negative.   Respiratory: Negative.    Cardiovascular: Negative.   Gastrointestinal:  Positive for abdominal pain and constipation. Negative for blood in stool, diarrhea, nausea and vomiting.  Genitourinary: Negative.   Neurological: Negative.     Review of Systems: A 12 point ROS discussed and pertinent positives are indicated in the HPI above.  All other systems are  negative.   Physical Exam No direct physical exam was performed (except for noted visual exam findings with Video Visits).   Vital Signs: There were no vitals taken for this visit.  Imaging: CT Angio Abd/Pel w/ and/or w/o Result Date: 09/22/2023 CLINICAL DATA:  Mesenteric ischemia EXAM: CTA ABDOMEN AND PELVIS WITHOUT AND WITH CONTRAST TECHNIQUE: Multidetector CT imaging of the abdomen and pelvis was performed using the standard protocol during bolus administration of intravenous contrast. Multiplanar reconstructed images and MIPs were obtained and reviewed to evaluate the vascular anatomy. RADIATION DOSE REDUCTION: This exam was performed according to the departmental dose-optimization program which includes automated exposure control, adjustment of the mA and/or kV according to patient size and/or use of iterative reconstruction technique. CONTRAST:  80mL ISOVUE-370 IOPAMIDOL (ISOVUE-370) INJECTION 76% COMPARISON:  08/29/2022 FINDINGS: VASCULAR Aorta: Moderate partially calcified plaque throughout, without high-grade stenosis. Small penetrating atheromatous ulcer posteriorly just above the bifurcation, stable. Celiac: Patent without evidence of aneurysm, dissection, vasculitis or significant stenosis. SMA: Patent ostial stent. Patent distally with classic branch anatomy. Renals: Both renal arteries are patent without evidence of aneurysm, dissection, vasculitis, fibromuscular dysplasia or significant stenosis. IMA: Patent without evidence of aneurysm, dissection, vasculitis or significant stenosis. Inflow: Aortic plaque extends in the proximal bilateral common iliac arteries resulting in mild ostial stenosis, atheromatous but patent distally. External iliac arteries patent. Scattered plaque in the internal iliac arteries. Proximal Outflow: Minimal plaque, no stenosis. Veins: Patent hepatic veins, portal vein, SM V, splenic vein, bilateral renal veins. Iliac venous system and IVC are incompletely  opacified, grossly unremarkable. Review of the MIP images confirms the above findings. NON-VASCULAR Lower chest: . no pleural or pericardial effusion. Visualized lung bases clear. Hepatobiliary: Scattered subcentimeter hepatic cysts as before. No new liver lesion or biliary ductal dilatation. Gallbladder physiologically distended without calcified gallstones. Pancreas: 1.6 cm lipoma involving the pancreatic head, slightly increased from 1.1 cm on 01/12/2016. No new mass, ductal dilatation, or regional inflammatory changes. Spleen: Normal in size without focal abnormality. Adrenals/Urinary Tract: No adrenal mass. No urolithiasis or hydronephrosis. Stable 1.2 cm lower pole left renal cyst; no follow-up warranted. Urinary bladder is nondistended. Stomach/Bowel: Stomach is nondistended, without acute finding. Small bowel decompressed. Normal appendix. The colon is partially distended, without acute finding. Lymphatic: No abdominal or pelvic adenopathy. Reproductive: Uterus and bilateral adnexa are unremarkable. Other: No ascites.  Left pelvic phlebolith.  No free air. Musculoskeletal: Stable instrumented interbody fusion L4-S1 with posterior decompression. IMPRESSION: 1. No acute findings. 2. Patent SMA ostial stent. 3.  Aortic Atherosclerosis (  ICD10-I70.0). Electronically Signed   By: Corlis Leak M.D.   On: 09/22/2023 21:51    Labs:  CBC: Recent Labs    12/18/22 1143 04/05/23 1033 07/18/23 1018  WBC 7.4 6.6 5.6  HGB 15.2 14.0 14.1  HCT 46.2 43.9 43.5  PLT 313 336 328    COAGS: No results for input(s): "INR", "APTT" in the last 8760 hours.  BMP: Recent Labs    12/18/22 1143 04/05/23 1033 07/18/23 1018  NA 140 140 140  K 4.5 5.0 4.5  CL 99 103 103  CO2 25 22 23   GLUCOSE 75 74 86  BUN 11 8 15   CALCIUM 9.5 9.7 9.3  CREATININE 0.96 0.91 0.86    LIVER FUNCTION TESTS: Recent Labs    12/18/22 1143 04/05/23 1033 07/18/23 1018  BILITOT 0.4 0.4 0.4  AST 12 17 15   ALT 8 6 10   ALKPHOS 75  84 90  PROT 6.6 6.7 6.4  ALBUMIN 4.4 4.5 4.3     Assessment and Plan:  I spoke to Mrs. Sanzone by phone. I reviewed findings from a CTA of the abdomen and pelvis with her from 09/21/2023 that demonstrates a widely patent proximal SMA covered stent with no stenosis or intimal hyperplasia. The rest of the SMA trunk is normally patent. No significant celiac stenosis. The IMA is small in caliber but patent.  Given findings, I reassured Mrs. Chiodo that her symptoms are not due to arterial insufficiency. There is evidence of significant stool in the colon by CT, and she will continue to try to manage her chronic constipation. I would like to still obtain a follow up mesenteric duplex ultrasound towards the end of the year. She will remain on aspirin and Plavix. We discussed discontinuing Plavix in the past, but she would like to remain on it for now.   Electronically Signed: Reola Calkins 09/27/2023, 2:49 PM    I spent a total of 15 Minutes in remote  clinical consultation, greater than 50% of which was counseling/coordinating care post stent placement in the SMA.    Visit type: Audio only (telephone). Audio (no video) only due to patient's lack of internet/smartphone capability. Alternative for in-person consultation at [facility], [address] [place], [place], [place]. This visit type was conducted due to national recommendations for restrictions regarding the COVID-19 Pandemic (e.g. social distancing).  This format is felt to be most appropriate for this patient at this time.  All issues noted in this document were discussed and addressed.

## 2023-10-02 DIAGNOSIS — M51362 Other intervertebral disc degeneration, lumbar region with discogenic back pain and lower extremity pain: Secondary | ICD-10-CM | POA: Diagnosis not present

## 2023-10-02 DIAGNOSIS — G35 Multiple sclerosis: Secondary | ICD-10-CM | POA: Diagnosis not present

## 2023-10-02 DIAGNOSIS — F1120 Opioid dependence, uncomplicated: Secondary | ICD-10-CM | POA: Diagnosis not present

## 2023-10-26 DIAGNOSIS — H1132 Conjunctival hemorrhage, left eye: Secondary | ICD-10-CM | POA: Diagnosis not present

## 2023-10-28 NOTE — Progress Notes (Unsigned)
 Subjective:  Patient ID: Christina Cantrell, female    DOB: 16-Aug-1961  Age: 63 y.o. MRN: [id number]  Chief Complaint  Patient presents with   Medical Management of Chronic Issues    HPI Patient is a 63 year old white female with multiple sclerosis, hyperlipidemia, depression who presents for chronic follow-up.  The patient, with a history of multiple sclerosis (MS), presents with a variety of health concerns. She reports a recent episode of high eye pressure, which was evaluated by an eye doctor. The pressure was found to be 20, which is high for the patient but lower than previous readings. The patient denies having glaucoma and is unsure why she has high eye pressure. Subconjunctival hemorrhage: saw the eye doctor on Friday. Recommended return in 2-3 weeks if not improving.   The patient also discusses her struggle with cholesterol management. She has tried various medications, including Crestor, atorvastatin, nexlitol,  Repatha, and Zetia, but has experienced side effects such as muscle aches and knee swelling. The patient has recently restarted Crestor at a lower dose (10mg ) and is awaiting blood work results to assess its effectiveness.  The patient's MS symptoms, particularly tremors, have improved significantly. However, she reports occasional stomach pain from taking Relistor, a medication for opioid-induced constipation. The patient also mentions a recent cessation of Celexa, a medication initially prescribed for depression but found to be effective for itching caused by fentanyl. The patient stopped the medication abruptly due to concerns about side effects and a desire to reduce her overall medication load.   Hyperlipidemia/CAD: Not on anything for cholesterol.  Patient is also on aspirin 81 mg daily and plavix 75 mg daily. Nexlitol - took one and left knee swelled up. Patient did not go to see a doctor. Has not tried zetia, but has a prescription. Restarted crestor 2 months ago. Started  on 10 mg because at higher doses it causes her to ache all over.   Eating healthy and exercising  Patient was recommended to take b12 shots monthly but stopped about 2 months ago. B12 was very high. She would like it checked today.   Depression: She is not taking Celexa 40 mg daily. Stopped cold [place]. Doing well.   MS: advanced. Not on any current medications for MS. Does not see neurology. Had vision issues: eye pain and blurry vision, now resolved having come off repatha.    Opioid dependence: patient sees pain clinic. On percocet 10/325 mg q4 hours as needed. Duragesic patches 75 mcg once every 48 hours. On flexeril 10 mg three times a day as needed. Citalopram helped with itching in the past. Not itching since discontinuation of citalopram.   Mammogram scheduled for 04/23/2024.     07/18/2023    9:03 AM 04/05/2023   10:27 AM 12/18/2022   11:26 AM 10/04/2021   10:39 AM 09/16/2020    9:54 AM  Depression screen PHQ 2/9  Decreased Interest 0 0 0 0 2  Down, Depressed, Hopeless 0 0 0 0 2  PHQ - 2 Score 0 0 0 0 4  Altered sleeping 3 1  0 3  Tired, decreased energy 3 3  0 3  Change in appetite 0 0  1   Feeling bad or failure about yourself  0 0  0 1  Trouble concentrating 0 0  0 0  Moving slowly or fidgety/restless 0 0  0 0  Suicidal thoughts 0 0  0 0  PHQ-9 Score 6 4  1 11   Difficult doing work/chores  Somewhat difficult Not difficult at all  Not difficult at all         04/19/2023    2:11 PM  Fall Risk   Falls in the past year? 0  Number falls in past yr: 0  Injury with Fall? 0  Risk for fall due to : History of fall(s)  Follow up Falls evaluation completed;Education provided    Patient Care Team: Blane Ohara, MD as PCP - General (Family Medicine) Baldo Daub, MD as Consulting Physician (Cardiology) Candace Gallus as Physician Assistant (Neurosurgery) Misenheimer, Marcial Pacas, MD as Consulting Physician (Unknown Physician Specialty) Roxanne Gates, OD  (Optometry) Cherly Beach, MD as Referring Physician (Family Medicine)   Review of Systems  Constitutional:  Negative for chills, fatigue and fever.  HENT:  Negative for congestion, ear pain, rhinorrhea and sore throat.   Respiratory:  Negative for cough and shortness of breath.   Cardiovascular:  Negative for chest pain.  Gastrointestinal:  Negative for abdominal pain, constipation, diarrhea, nausea and vomiting.  Genitourinary:  Negative for dysuria and urgency.  Musculoskeletal:  Negative for back pain and myalgias.  Neurological:  Negative for dizziness, weakness, light-headedness and headaches.  Psychiatric/Behavioral:  Negative for dysphoric mood. The patient is not nervous/anxious.     Current Outpatient Medications on File Prior to Visit  Medication Sig Dispense Refill   rosuvastatin (CRESTOR) 10 MG tablet Take 10 mg by mouth daily.     albuterol (VENTOLIN HFA) 108 (90 Base) MCG/ACT inhaler Inhale 2 puffs into the lungs every 6 (six) hours as needed for wheezing or shortness of breath. 18 g 3   aspirin EC 81 MG tablet Take 81 mg by mouth daily. Swallow whole.     clopidogrel (PLAVIX) 75 MG tablet TAKE 1 TABLET BY MOUTH EVERY DAY 90 tablet 0   cyanocobalamin (VITAMIN B12) 1000 MCG/ML injection INJECT 1 ML (1,000 MCG TOTAL) INTO THE MUSCLE EVERY 30 DAYS. 3 mL 1   cyclobenzaprine (FLEXERIL) 10 MG tablet Take 10 mg by mouth 3 (three) times daily as needed for muscle spasms.     diltiazem (CARDIZEM CD) 180 MG 24 hr capsule Take 1 capsule (180 mg total) by mouth daily. 90 capsule 3   fentaNYL (DURAGESIC) 75 MCG/HR Place 1 patch onto the skin 2 days.     metroNIDAZOLE (METROCREAM) 0.75 % cream Apply 1 Application topically 2 (two) times daily.     nitroGLYCERIN (NITROSTAT) 0.4 MG SL tablet Place 0.4 mg under the tongue every 5 (five) minutes as needed for chest pain.     norethindrone-ethinyl estradiol (FYAVOLV) 1-5 MG-MCG TABS tablet Take 1 tablet by mouth daily.     omeprazole  (PRILOSEC) 40 MG capsule TAKE 1 CAPSULE (40 MG TOTAL) BY MOUTH DAILY. 90 capsule 1   oxyCODONE-acetaminophen (PERCOCET) 10-325 MG tablet Take 1 tablet by mouth every 4 (four) hours as needed for pain.     Polyethyl Glycol-Propyl Glycol (SYSTANE OP) Place 1 drop into both eyes daily as needed (dry eyes).     RELISTOR 150 MG TABS Take 3 tablets by mouth every morning.     Vitamin D, Ergocalciferol, (DRISDOL) 1.25 MG (50000 UNIT) CAPS capsule TAKE 1 CAPSULE (50,000 UNITS TOTAL) BY MOUTH TWO TIMES A WEEK 24 capsule 1   No current facility-administered medications on file prior to visit.   Past Medical History:  Diagnosis Date   Angina pectoris (HCC) 03/16/2022   B12 deficiency 09/13/2022   Central sleep apnea comorbid with prescribed opioid use,  moderate 10/30/2022   Central sleep apnea syndrome 05/30/2018   Centrilobular emphysema (HCC) 10/04/2021   Cervical radiculopathy 07/21/2021   Chronic low back pain 06/04/2013   Degeneration of lumbar intervertebral disc 06/04/2013   Epigastric pain 10/04/2021   Esophageal dysphagia 02/13/2022   Ex-smoker 03/16/2022   Familial hypercholesterolemia 12/22/2022   Familial hyperlipidemia 06/27/2021   GERD without esophagitis 09/13/2022   Localized osteoporosis without current pathological fracture 06/27/2021   Mixed hyperlipidemia 06/27/2021   Multiple sclerosis (HCC) 1991   Age 67 yo   Need for influenza vaccination 05/28/2022   Neuropathic pain 11/12/2017   Optic neuritis 09/17/2018   Osteoarthritis 09/17/2018   Osteopenia 11/20/2018   Other chest pain 02/18/2022   Pain in thoracic spine 06/04/2013   Pain of upper abdomen 10/04/2021   Paresthesia 02/13/2022   Polyuria 02/18/2022   Screening for lung cancer 12/29/2021   Spinal stenosis 09/17/2018   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   Therapeutic opioid induced constipation 02/18/2022   Therapeutic opioid-induced constipation (OIC)  02/18/2022   Uncomplicated opioid dependence (HCC) 09/12/2021   Vitamin B12 deficiency 05/28/2022   Vitamin D deficiency 06/27/2021   Past Surgical History:  Procedure Laterality Date   BREAST BIOPSY Bilateral 08/24/2020   BREAST EXCISIONAL BIOPSY Right 1975   IR ANGIOGRAM VISCERAL SELECTIVE  03/02/2022   IR RADIOLOGIST EVAL & MGMT  02/21/2022   IR RADIOLOGIST EVAL & MGMT  04/03/2022   IR RADIOLOGIST EVAL & MGMT  06/12/2022   IR RADIOLOGIST EVAL & MGMT  05/09/2023   IR RADIOLOGIST EVAL & MGMT  09/27/2023   IR TRANSCATH PLC STENT 1ST ART NOT LE CV CAR VERT CAR  03/02/2022   IR US GUIDE VASC ACCESS RIGHT  03/02/2022    Family History  Family history unknown: Yes   Social History   Socioeconomic History   Marital status: Married    Spouse name: Molly Maduro   Number of children: 1   Years of education: Not on file   Highest education level: Associate degree: occupational, Scientist, product/process development, or vocational program  Occupational History   Not on file  Tobacco Use   Smoking status: Former    Current packs/day: 0.00    Types: Cigarettes    Start date: 1998    Quit date: 2018    Years since quitting: 7.1   Smokeless tobacco: Never  Vaping Use   Vaping status: Never Used  Substance and Sexual Activity   Alcohol use: Never   Drug use: Never   Sexual activity: Not on file  Other Topics Concern   Not on file  Social History Narrative   Not on file   Social Drivers of Health   Financial Resource Strain: Low Risk  (07/18/2023)   Overall Financial Resource Strain (CARDIA)    Difficulty of Paying Living Expenses: Not very hard  Food Insecurity: No Food Insecurity (07/18/2023)   Hunger Vital Sign    Worried About Running Out of Food in the Last Year: Never true    Ran Out of Food in the Last Year: Never true  Transportation Needs: No Transportation Needs (08/31/2022)   PRAPARE - Administrator, Civil Service (Medical): No    Lack of Transportation (Non-Medical): No  Physical  Activity: Insufficiently Active (07/18/2023)   Exercise Vital Sign    Days of Exercise per Week: 1 day    Minutes of Exercise per Session: 30 min  Stress: No Stress Concern Present (07/18/2023)  [employer] of Occupational Health - Occupational Stress Questionnaire    Feeling of Stress : Not at all  Social Connections: Socially Isolated (07/18/2023)   Social Connection and Isolation Panel [NHANES]    Frequency of Communication with Friends and Family: Once a week    Frequency of Social Gatherings with Friends and Family: Once a week    Attends Religious Services: Never    Diplomatic Services operational officer: No    Attends Engineer, structural: Never    Marital Status: Married    Objective:  BP 132/78   Pulse 84   Temp 97.6 F (36.4 C)   Ht 5\' 4"  (1.626 m)   Wt 135 lb (61.2 kg)   SpO2 98%   BMI 23.17 kg/m      10/29/2023    9:15 AM 07/18/2023    8:58 AM 06/27/2023    3:46 PM  BP/Weight  Systolic BP 132 132 130  Diastolic BP 78 70 80  Wt. (Lbs) 135 134 132  BMI 23.17 kg/m2 23 kg/m2 22.66 kg/m2    Physical Exam Vitals reviewed.  Constitutional:      Appearance: Normal appearance. She is normal weight.  Eyes:     Comments: Left subconjunctival hemorrhage.   Neck:     Vascular: No carotid bruit.  Cardiovascular:     Rate and Rhythm: Normal rate and regular rhythm.     Heart sounds: Normal heart sounds.  Pulmonary:     Effort: Pulmonary effort is normal. No respiratory distress.     Breath sounds: Normal breath sounds.  Abdominal:     General: Abdomen is flat. Bowel sounds are normal.     Palpations: Abdomen is soft.     Tenderness: There is no abdominal tenderness.  Neurological:     Mental Status: She is alert and oriented to person, place, and time.  Psychiatric:        Mood and Affect: Mood normal.        Behavior: Behavior normal.     Diabetic Foot Exam - Simple   No data filed      Lab Results  Component Value Date   WBC 5.6  07/18/2023   HGB 14.1 07/18/2023   HCT 43.5 07/18/2023   PLT 328 07/18/2023   GLUCOSE 86 07/18/2023   CHOL 222 (H) 07/18/2023   TRIG 89 07/18/2023   HDL 79 07/18/2023   LDLCALC 128 (H) 07/18/2023   ALT 10 07/18/2023   AST 15 07/18/2023   NA 140 07/18/2023   K 4.5 07/18/2023   CL 103 07/18/2023   CREATININE 0.86 07/18/2023   BUN 15 07/18/2023   CO2 23 07/18/2023   TSH 3.150 07/18/2023   INR 1.0 03/02/2022      Assessment & Plan:    Mixed hyperlipidemia Assessment & Plan: Await labs/testing for assessment and recommendations. Continue crestor 10 mg before bed.  Continue to work on eating a healthy diet and exercise.  Labs drawn today.    Orders: -     CBC with Differential/Platelet -     Comprehensive metabolic panel -     Lipid panel  Uncomplicated opioid dependence (HCC) Assessment & Plan: Seen at pain clinic.    Vitamin B12 deficiency Assessment & Plan: Check b12 level.   Orders: -     Vitamin B12  Vitamin D deficiency Assessment & Plan: Continue vitamin D twice weekly. Check level.   Orders: -     VITAMIN D 25 Hydroxy (Vit-D  Deficiency, Fractures)  Central sleep apnea syndrome Assessment & Plan: Benefits and is compliant and using CPAP.   Multiple sclerosis (HCC) Assessment & Plan: Stable.  Prefers no medications for MS.   Hot flashes due to menopause Assessment & Plan: Uncontrolled.  Start on veozah once daily. Stop Fyalvo (hormone.)   Familial hyperlipidemia Assessment & Plan: Await labs/testing for assessment and recommendations. Continue crestor 10 mg before bed.  Continue to work on eating a healthy diet and exercise.  Labs drawn today.     Therapeutic opioid induced constipation Assessment & Plan:  Currently on Relistor..   Subconjunctival hemorrhage of left eye Assessment & Plan: Reassurance given. Recommended follow up with ophthalmology.      No orders of the defined types were placed in this encounter.   Orders  Placed This Encounter  Procedures   HM DEXA SCAN   CBC with Differential/Platelet   Comprehensive metabolic panel   Lipid panel   VITAMIN D 25 Hydroxy (Vit-D Deficiency, Fractures)   Vitamin B12     Follow-up: Return in about 3 months (around 01/26/2024) for chronic fasting.   I,Marla I Leal-Borjas,acting as a scribe for Blane Ohara, MD.,have documented all relevant documentation on the behalf of Blane Ohara, MD,as directed by  Blane Ohara, MD while in the presence of Blane Ohara, MD.   An After Visit Summary was printed and given to the patient.  I attest that I have reviewed this visit and agree with the plan scribed by my staff.   Blane Ohara, MD [facility] [phone]

## 2023-10-29 ENCOUNTER — Encounter: Payer: Self-pay | Admitting: Family Medicine

## 2023-10-29 ENCOUNTER — Ambulatory Visit (INDEPENDENT_AMBULATORY_CARE_PROVIDER_SITE_OTHER): Payer: Medicare HMO | Admitting: Family Medicine

## 2023-10-29 VITALS — BP 132/78 | HR 84 | Temp 97.6°F | Ht 64.0 in | Wt 135.0 lb

## 2023-10-29 DIAGNOSIS — T402X5A Adverse effect of other opioids, initial encounter: Secondary | ICD-10-CM

## 2023-10-29 DIAGNOSIS — N951 Menopausal and female climacteric states: Secondary | ICD-10-CM | POA: Diagnosis not present

## 2023-10-29 DIAGNOSIS — G4731 Primary central sleep apnea: Secondary | ICD-10-CM | POA: Diagnosis not present

## 2023-10-29 DIAGNOSIS — H1132 Conjunctival hemorrhage, left eye: Secondary | ICD-10-CM | POA: Insufficient documentation

## 2023-10-29 DIAGNOSIS — J432 Centrilobular emphysema: Secondary | ICD-10-CM

## 2023-10-29 DIAGNOSIS — F1120 Opioid dependence, uncomplicated: Secondary | ICD-10-CM

## 2023-10-29 DIAGNOSIS — E559 Vitamin D deficiency, unspecified: Secondary | ICD-10-CM

## 2023-10-29 DIAGNOSIS — E538 Deficiency of other specified B group vitamins: Secondary | ICD-10-CM

## 2023-10-29 DIAGNOSIS — E782 Mixed hyperlipidemia: Secondary | ICD-10-CM

## 2023-10-29 DIAGNOSIS — G35 Multiple sclerosis: Secondary | ICD-10-CM | POA: Diagnosis not present

## 2023-10-29 DIAGNOSIS — M792 Neuralgia and neuritis, unspecified: Secondary | ICD-10-CM

## 2023-10-29 DIAGNOSIS — E7849 Other hyperlipidemia: Secondary | ICD-10-CM

## 2023-10-29 NOTE — Patient Instructions (Addendum)
 Stop Fyalvo (hormone.) Start veozah once daily.  Let us know if it is helping and I will send a prescription.

## 2023-10-29 NOTE — Assessment & Plan Note (Signed)
 Stable.  Prefers no medications for MS.

## 2023-10-29 NOTE — Assessment & Plan Note (Signed)
 Continue vitamin D twice weekly. Check level.

## 2023-10-29 NOTE — Assessment & Plan Note (Signed)
 Reassurance given. Recommended follow up with ophthalmology.

## 2023-10-29 NOTE — Assessment & Plan Note (Signed)
Seen at pain clinic.  °

## 2023-10-29 NOTE — Assessment & Plan Note (Signed)
Check b12 level  

## 2023-10-29 NOTE — Assessment & Plan Note (Signed)
 Await labs/testing for assessment and recommendations. Continue crestor 10 mg before bed.  Continue to work on eating a healthy diet and exercise.  Labs drawn today.

## 2023-10-29 NOTE — Assessment & Plan Note (Signed)
 Uncontrolled.  Start on veozah once daily. Stop Fyalvo (hormone.)

## 2023-10-29 NOTE — Assessment & Plan Note (Signed)
 Currently on Relistor.Marland Kitchen

## 2023-10-29 NOTE — Assessment & Plan Note (Signed)
 Benefits and is compliant and using CPAP.

## 2023-10-30 ENCOUNTER — Encounter: Payer: Self-pay | Admitting: Family Medicine

## 2023-10-30 LAB — COMPREHENSIVE METABOLIC PANEL
ALT: 9 IU/L (ref 0–32)
AST: 15 IU/L (ref 0–40)
Albumin: 4.3 g/dL (ref 3.8–4.9)
Alkaline Phosphatase: 78 IU/L (ref 44–121)
BUN/Creatinine Ratio: 15 (ref 12–28)
BUN: 13 mg/dL (ref 8–27)
Bilirubin Total: 0.4 mg/dL (ref 0.0–1.2)
CO2: 21 mmol/L (ref 20–29)
Calcium: 9.2 mg/dL (ref 8.7–10.3)
Chloride: 100 mmol/L (ref 96–106)
Creatinine, Ser: 0.88 mg/dL (ref 0.57–1.00)
Globulin, Total: 2.5 g/dL (ref 1.5–4.5)
Glucose: 92 mg/dL (ref 70–99)
Potassium: 4.3 mmol/L (ref 3.5–5.2)
Sodium: 141 mmol/L (ref 134–144)
Total Protein: 6.8 g/dL (ref 6.0–8.5)
eGFR: 75 mL/min/{1.73_m2} (ref >59)

## 2023-10-30 LAB — CBC WITH DIFFERENTIAL/PLATELET
Basophils Absolute: 0.1 10*3/uL (ref 0.0–0.2)
Basos: 1 %
EOS (ABSOLUTE): 0.2 10*3/uL (ref 0.0–0.4)
Eos: 2 %
Hematocrit: 45.3 % (ref 34.0–46.6)
Hemoglobin: 14.7 g/dL (ref 11.1–15.9)
Immature Grans (Abs): 0 10*3/uL (ref 0.0–0.1)
Immature Granulocytes: 0 %
Lymphocytes Absolute: 2.3 10*3/uL (ref 0.7–3.1)
Lymphs: 30 %
MCH: 30 pg (ref 26.6–33.0)
MCHC: 32.5 g/dL (ref 31.5–35.7)
MCV: 92 fL (ref 79–97)
Monocytes Absolute: 0.4 10*3/uL (ref 0.1–0.9)
Monocytes: 6 %
Neutrophils Absolute: 4.7 10*3/uL (ref 1.4–7.0)
Neutrophils: 61 %
Platelets: 274 10*3/uL (ref 150–450)
RBC: 4.9 x10E6/uL (ref 3.77–5.28)
RDW: 12.1 % (ref 11.7–15.4)
WBC: 7.7 10*3/uL (ref 3.4–10.8)

## 2023-10-30 LAB — LIPID PANEL
Chol/HDL Ratio: 2.5 ratio (ref 0.0–4.4)
Cholesterol, Total: 186 mg/dL (ref 100–199)
HDL: 73 mg/dL (ref >39)
LDL Chol Calc (NIH): 92 mg/dL (ref 0–99)
Triglycerides: 124 mg/dL (ref 0–149)
VLDL Cholesterol Cal: 21 mg/dL (ref 5–40)

## 2023-10-30 LAB — VITAMIN D 25 HYDROXY (VIT D DEFICIENCY, FRACTURES): Vit D, 25-Hydroxy: 38.1 ng/mL (ref 30.0–100.0)

## 2023-10-30 LAB — VITAMIN B12: Vitamin B-12: 404 pg/mL (ref 232–1245)

## 2023-11-20 ENCOUNTER — Encounter: Payer: Self-pay | Admitting: Family Medicine

## 2023-11-20 ENCOUNTER — Ambulatory Visit (INDEPENDENT_AMBULATORY_CARE_PROVIDER_SITE_OTHER): Admitting: Family Medicine

## 2023-11-20 VITALS — BP 110/68 | HR 119 | Temp 97.6°F | Resp 16 | Ht 64.0 in | Wt 132.6 lb

## 2023-11-20 DIAGNOSIS — E538 Deficiency of other specified B group vitamins: Secondary | ICD-10-CM | POA: Diagnosis not present

## 2023-11-20 DIAGNOSIS — G35 Multiple sclerosis: Secondary | ICD-10-CM | POA: Diagnosis not present

## 2023-11-20 DIAGNOSIS — H60323 Hemorrhagic otitis externa, bilateral: Secondary | ICD-10-CM | POA: Insufficient documentation

## 2023-11-20 DIAGNOSIS — H66003 Acute suppurative otitis media without spontaneous rupture of ear drum, bilateral: Secondary | ICD-10-CM | POA: Insufficient documentation

## 2023-11-20 MED ORDER — NEOMYCIN-POLYMYXIN-HC 3.5-10000-1 OT SOLN: 3.0000 [drp] | Freq: Four times a day (QID) | OTIC | 0 refills | Status: DC

## 2023-11-20 MED ORDER — CYANOCOBALAMIN 1000 MCG/ML IJ SOLN
1000.0000 ug | Freq: Once | INTRAMUSCULAR | Status: AC
  Administered 2023-11-20: 1000 ug via INTRAMUSCULAR

## 2023-11-20 NOTE — Assessment & Plan Note (Signed)
 Symptoms consistent with otitis externa, possibly exacerbated by recent RSV vaccination and fluid accumulation. Tympanic membrane intact, no perforation. - Prescribed neomycin and polymyxin B ear drops with hydrocortisone, 3 drops in both ears, four times daily for 7 days. - Advised use of Flonase and Zyrtec to manage fluid and inflammation. - Refer to ENT if unresolved after 14 days.

## 2023-11-20 NOTE — Progress Notes (Signed)
 Acute Office Visit  Subjective:    Patient ID: Christina Cantrell, female    DOB: 1961/06/13, 63 y.o.   MRN: [id number]  Chief Complaint  Patient presents with   Ear Pain    Bloody drainage    Discussed the use of AI scribe software for clinical note transcription with the patient, who gave verbal consent to proceed.   HPI: Christina Cantrell is a 63 year old female with multiple sclerosis who presents with dizziness and ear discomfort. She is accompanied by her husband.  She experiences significant dizziness and ear discomfort, particularly in her left ear, which began after receiving an RSV vaccine. The ear feels full, and she has noticed blood on the outside of the ear canal. She attempted to clean the ear with a Q-tip, resulting in significant bleeding. No medications have been taken for the fluid in her ear, and the dizziness impacts her ability to function normally, described as feeling 'like being on a boat'. Patient states she has had this pain for about a week. Patient stated that on Wednesday of last week her ear had some bloody drainage. Patient rates her ear pain at a six at its worse. Patient also states she has had some dizziness and double vision, however, she is not sure if that is coming from her MS or her ear pain.  After receiving the RSV vaccine, she experienced a high fever of 103-104F for five days, during which she was unable to get out of bed. She suspects the vaccine may have contributed to her current ear issues.  She has a history of multiple sclerosis, causing tremors and double vision, which worsens with optic nerve swelling. Neuropathy pain related to MS is managed with fentanyl patches. MS symptoms, including tremors, are exacerbated when she is due for her medication. She does not regularly take antihistamines or nasal sprays like Flonase, although she has a new bottle at home and prefers not to take medications unless necessary.  No current medications are  being taken for her ear symptoms.  Past Medical History:  Diagnosis Date   Angina pectoris (HCC) 03/16/2022   B12 deficiency 09/13/2022   Central sleep apnea comorbid with prescribed opioid use, moderate 10/30/2022   Central sleep apnea syndrome 05/30/2018   Centrilobular emphysema (HCC) 10/04/2021   Cervical radiculopathy 07/21/2021   Chronic low back pain 06/04/2013   Degeneration of lumbar intervertebral disc 06/04/2013   Epigastric pain 10/04/2021   Esophageal dysphagia 02/13/2022   Ex-smoker 03/16/2022   Familial hypercholesterolemia 12/22/2022   Familial hyperlipidemia 06/27/2021   GERD without esophagitis 09/13/2022   Localized osteoporosis without current pathological fracture 06/27/2021   Mixed hyperlipidemia 06/27/2021   Multiple sclerosis (HCC) 1991   Age 15 yo   Need for influenza vaccination 05/28/2022   Neuropathic pain 11/12/2017   Optic neuritis 09/17/2018   Osteoarthritis 09/17/2018   Osteopenia 11/20/2018   Other chest pain 02/18/2022   Pain in thoracic spine 06/04/2013   Pain of upper abdomen 10/04/2021   Paresthesia 02/13/2022   Polyuria 02/18/2022   Screening for lung cancer 12/29/2021   Spinal stenosis 09/17/2018   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   Therapeutic opioid induced constipation 02/18/2022   Therapeutic opioid-induced constipation (OIC) 02/18/2022   Uncomplicated opioid dependence (HCC) 09/12/2021   Vitamin B12 deficiency 05/28/2022   Vitamin D deficiency 06/27/2021    Past Surgical History:  Procedure Laterality Date   BREAST BIOPSY Bilateral 08/24/2020   BREAST  EXCISIONAL BIOPSY Right 1975   IR ANGIOGRAM VISCERAL SELECTIVE  03/02/2022   IR RADIOLOGIST EVAL & MGMT  02/21/2022   IR RADIOLOGIST EVAL & MGMT  04/03/2022   IR RADIOLOGIST EVAL & MGMT  06/12/2022   IR RADIOLOGIST EVAL & MGMT  05/09/2023   IR RADIOLOGIST EVAL & MGMT  09/27/2023   IR TRANSCATH PLC STENT 1ST ART NOT LE CV CAR VERT  CAR  03/02/2022   IR US GUIDE VASC ACCESS RIGHT  03/02/2022    Family History  Family history unknown: Yes    Social History   Socioeconomic History   Marital status: Married    Spouse name: Molly Maduro   Number of children: 1   Years of education: Not on file   Highest education level: Associate degree: occupational, Scientist, product/process development, or vocational program  Occupational History   Not on file  Tobacco Use   Smoking status: Former    Current packs/day: 0.00    Types: Cigarettes    Start date: 1998    Quit date: 2018    Years since quitting: 7.2   Smokeless tobacco: Never  Vaping Use   Vaping status: Never Used  Substance and Sexual Activity   Alcohol use: Never   Drug use: Never   Sexual activity: Not on file  Other Topics Concern   Not on file  Social History Narrative   Not on file   Social Drivers of Health   Financial Resource Strain: Low Risk  (07/18/2023)   Overall Financial Resource Strain (CARDIA)    Difficulty of Paying Living Expenses: Not very hard  Food Insecurity: No Food Insecurity (07/18/2023)   Hunger Vital Sign    Worried About Running Out of Food in the Last Year: Never true    Ran Out of Food in the Last Year: Never true  Transportation Needs: No Transportation Needs (08/31/2022)   PRAPARE - Administrator, Civil Service (Medical): No    Lack of Transportation (Non-Medical): No  Physical Activity: Insufficiently Active (07/18/2023)   Exercise Vital Sign    Days of Exercise per Week: 1 day    Minutes of Exercise per Session: 30 min  Stress: No Stress Concern Present (07/18/2023)   [employer] of Occupational Health - Occupational Stress Questionnaire    Feeling of Stress : Not at all  Social Connections: Socially Isolated (07/18/2023)   Social Connection and Isolation Panel [NHANES]    Frequency of Communication with Friends and Family: Once a week    Frequency of Social Gatherings with Friends and Family: Once a week    Attends  Religious Services: Never    Database administrator or Organizations: No    Attends Banker Meetings: Never    Marital Status: Married  Catering manager Violence: Not At Risk (04/05/2023)   Humiliation, Afraid, Rape, and Kick questionnaire    Fear of Current or Ex-Partner: No    Emotionally Abused: No    Physically Abused: No    Sexually Abused: No    Outpatient Medications Prior to Visit  Medication Sig Dispense Refill   albuterol (VENTOLIN HFA) 108 (90 Base) MCG/ACT inhaler Inhale 2 puffs into the lungs every 6 (six) hours as needed for wheezing or shortness of breath. 18 g 3   aspirin EC 81 MG tablet Take 81 mg by mouth daily. Swallow whole.     clopidogrel (PLAVIX) 75 MG tablet TAKE 1 TABLET BY MOUTH EVERY DAY 90 tablet 0   cyanocobalamin (VITAMIN B12)  1000 MCG/ML injection INJECT 1 ML (1,000 MCG TOTAL) INTO THE MUSCLE EVERY 30 DAYS. 3 mL 1   cyclobenzaprine (FLEXERIL) 10 MG tablet Take 10 mg by mouth 3 (three) times daily as needed for muscle spasms.     diltiazem (CARDIZEM CD) 180 MG 24 hr capsule Take 1 capsule (180 mg total) by mouth daily. 90 capsule 3   doxycycline (VIBRAMYCIN) 50 MG capsule Take 50 mg by mouth 2 (two) times daily.     fentaNYL (DURAGESIC) 75 MCG/HR Place 1 patch onto the skin 2 days.     metroNIDAZOLE (METROCREAM) 0.75 % cream Apply 1 Application topically 2 (two) times daily.     nitroGLYCERIN (NITROSTAT) 0.4 MG SL tablet Place 0.4 mg under the tongue every 5 (five) minutes as needed for chest pain.     norethindrone-ethinyl estradiol (FYAVOLV) 1-5 MG-MCG TABS tablet Take 1 tablet by mouth daily.     omeprazole (PRILOSEC) 40 MG capsule TAKE 1 CAPSULE (40 MG TOTAL) BY MOUTH DAILY. 90 capsule 1   oxyCODONE-acetaminophen (PERCOCET) 10-325 MG tablet Take 1 tablet by mouth every 4 (four) hours as needed for pain.     Polyethyl Glycol-Propyl Glycol (SYSTANE OP) Place 1 drop into both eyes daily as needed (dry eyes).     RELISTOR 150 MG TABS Take 3 tablets  by mouth every morning.     rosuvastatin (CRESTOR) 10 MG tablet Take 10 mg by mouth daily.     Vitamin D, Ergocalciferol, (DRISDOL) 1.25 MG (50000 UNIT) CAPS capsule TAKE 1 CAPSULE (50,000 UNITS TOTAL) BY MOUTH TWO TIMES A WEEK 24 capsule 1   No facility-administered medications prior to visit.    Allergies  Allergen Reactions   Zetia [Ezetimibe]     Right knee swelling within one day.    Atorvastatin     Throat closed up.    Nexletol [Bempedoic Acid]     Left knee pain    Sertraline Hcl Hives   Repatha [Evolocumab] Other (See Comments)    Headache and Brain Fog    Review of Systems  Constitutional:  Negative for chills, diaphoresis, fatigue and fever.  HENT:  Positive for ear discharge and ear pain. Negative for congestion and sinus pain.   Eyes: Negative.   Respiratory:  Negative for cough and shortness of breath.   Cardiovascular:  Negative for chest pain.  Gastrointestinal:  Negative for abdominal pain, constipation, nausea and vomiting.  Endocrine: Negative.   Genitourinary:  Negative for dysuria.  Musculoskeletal:  Negative for arthralgias.  Allergic/Immunologic: Negative.   Neurological:  Positive for dizziness. Negative for weakness and headaches.  Psychiatric/Behavioral:  Negative for dysphoric mood. The patient is not nervous/anxious.        Objective:        11/20/2023    2:41 PM 10/29/2023    9:15 AM 07/18/2023    8:58 AM  Vitals with BMI  Height 5\' 4"  5\' 4"  5\' 4"   Weight 132 lbs 10 oz 135 lbs 134 lbs  BMI 22.75 23.16 22.99  Systolic 110 132 409  Diastolic 68 78 70  Pulse 119 84 71    No data found.   Physical Exam Constitutional:      General: She is not in acute distress.    Appearance: Normal appearance. She is ill-appearing.  HENT:     Right Ear: Tenderness present. A middle ear effusion is present.     Left Ear: Drainage (blood) and tenderness present. A middle ear effusion is present.  Eyes:  Conjunctiva/sclera: Conjunctivae normal.   Cardiovascular:     Rate and Rhythm: Normal rate and regular rhythm.     Heart sounds: Normal heart sounds. No murmur heard. Pulmonary:     Effort: Pulmonary effort is normal.     Breath sounds: Normal breath sounds. No wheezing.  Musculoskeletal:        General: Normal range of motion.     Cervical back: Normal range of motion.  Skin:    General: Skin is warm.  Neurological:     Mental Status: She is alert. Mental status is at baseline.  Psychiatric:        Mood and Affect: Mood normal.        Behavior: Behavior normal.     Health Maintenance Due  Topic Date Due   Zoster Vaccines- Shingrix (1 of 2) Never done    There are no preventive care reminders to display for this patient.   Lab Results  Component Value Date   TSH 3.150 07/18/2023   Lab Results  Component Value Date   WBC 7.7 10/29/2023   HGB 14.7 10/29/2023   HCT 45.3 10/29/2023   MCV 92 10/29/2023   PLT 274 10/29/2023   Lab Results  Component Value Date   NA 141 10/29/2023   K 4.3 10/29/2023   CO2 21 10/29/2023   GLUCOSE 92 10/29/2023   BUN 13 10/29/2023   CREATININE 0.88 10/29/2023   BILITOT 0.4 10/29/2023   ALKPHOS 78 10/29/2023   AST 15 10/29/2023   ALT 9 10/29/2023   PROT 6.8 10/29/2023   ALBUMIN 4.3 10/29/2023   CALCIUM 9.2 10/29/2023   ANIONGAP 10 08/28/2022   EGFR 75 10/29/2023   Lab Results  Component Value Date   CHOL 186 10/29/2023   Lab Results  Component Value Date   HDL 73 10/29/2023   Lab Results  Component Value Date   LDLCALC 92 10/29/2023   Lab Results  Component Value Date   TRIG 124 10/29/2023   Lab Results  Component Value Date   CHOLHDL 2.5 10/29/2023   No results found for: "HGBA1C"     Assessment & Plan:  Acute hemorrhagic otitis externa of both ears Assessment & Plan: Symptoms consistent with otitis externa, possibly exacerbated by recent RSV vaccination and fluid accumulation. Tympanic membrane intact, no perforation. - Prescribed neomycin and  polymyxin B ear drops with hydrocortisone, 3 drops in both ears, four times daily for 7 days. - Advised use of Flonase and Zyrtec to manage fluid and inflammation. - Refer to ENT if unresolved after 14 days.  Orders: -     Neomycin-Polymyxin-HC; Place 3 drops into both ears 4 (four) times daily.  Dispense: 20 mL; Refill: 0  Vitamin B12 deficiency Assessment & Plan: Administer B12 injection in the left deltoid  Orders: -     Cyanocobalamin     Meds ordered this encounter  Medications   cyanocobalamin (VITAMIN B12) injection 1,000 mcg   neomycin-polymyxin-hydrocortisone (CORTISPORIN) OTIC solution    Sig: Place 3 drops into both ears 4 (four) times daily.    Dispense:  20 mL    Refill:  0    No orders of the defined types were placed in this encounter.    Follow-up: Return if symptoms worsen or fail to improve.  An After Visit Summary was printed and given to the patient.  Total time spent on today's visit was 33 minutes, including both face-to-face time and nonface-to-face time personally spent on review of chart (labs and  imaging), discussing labs and goals, discussing further work-up, treatment options, referrals to specialist if needed, reviewing outside records if pertinent, answering patient's questions, and coordinating care.    Lajuana Matte, FNP [facility] [phone]

## 2023-11-20 NOTE — Assessment & Plan Note (Signed)
 Administer B12 injection in the left deltoid

## 2023-12-14 ENCOUNTER — Encounter (HOSPITAL_BASED_OUTPATIENT_CLINIC_OR_DEPARTMENT_OTHER): Payer: Self-pay | Admitting: Emergency Medicine

## 2023-12-14 ENCOUNTER — Other Ambulatory Visit: Payer: Self-pay

## 2023-12-14 ENCOUNTER — Emergency Department (HOSPITAL_BASED_OUTPATIENT_CLINIC_OR_DEPARTMENT_OTHER)

## 2023-12-14 ENCOUNTER — Emergency Department (HOSPITAL_BASED_OUTPATIENT_CLINIC_OR_DEPARTMENT_OTHER)
Admission: EM | Admit: 2023-12-14 | Discharge: 2023-12-14 | Disposition: A | Discharge status: Home / Self Care | Attending: Emergency Medicine | Admitting: Emergency Medicine

## 2023-12-14 DIAGNOSIS — Z7982 Long term (current) use of aspirin: Secondary | ICD-10-CM | POA: Diagnosis not present

## 2023-12-14 DIAGNOSIS — K5909 Other constipation: Secondary | ICD-10-CM | POA: Insufficient documentation

## 2023-12-14 DIAGNOSIS — K5900 Constipation, unspecified: Secondary | ICD-10-CM | POA: Diagnosis present

## 2023-12-14 DIAGNOSIS — I700 Atherosclerosis of aorta: Secondary | ICD-10-CM | POA: Diagnosis not present

## 2023-12-14 DIAGNOSIS — R109 Unspecified abdominal pain: Secondary | ICD-10-CM | POA: Diagnosis not present

## 2023-12-14 LAB — CBC WITH DIFFERENTIAL/PLATELET
Abs Immature Granulocytes: 0.01 10*3/uL (ref 0.00–0.07)
Basophils Absolute: 0 10*3/uL (ref 0.0–0.1)
Basophils Relative: 1 %
Eosinophils Absolute: 0.1 10*3/uL (ref 0.0–0.5)
Eosinophils Relative: 1 %
HCT: 44.7 % (ref 36.0–46.0)
Hemoglobin: 15 g/dL (ref 12.0–15.0)
Immature Granulocytes: 0 %
Lymphocytes Relative: 40 %
Lymphs Abs: 2.6 10*3/uL (ref 0.7–4.0)
MCH: 30 pg (ref 26.0–34.0)
MCHC: 33.6 g/dL (ref 30.0–36.0)
MCV: 89.4 fL (ref 80.0–100.0)
Monocytes Absolute: 0.4 10*3/uL (ref 0.1–1.0)
Monocytes Relative: 7 %
Neutro Abs: 3.3 10*3/uL (ref 1.7–7.7)
Neutrophils Relative %: 51 %
Platelets: 365 10*3/uL (ref 150–400)
RBC: 5 MIL/uL (ref 3.87–5.11)
RDW: 12.4 % (ref 11.5–15.5)
WBC: 6.5 10*3/uL (ref 4.0–10.5)
nRBC: 0 % (ref 0.0–0.2)

## 2023-12-14 LAB — COMPREHENSIVE METABOLIC PANEL WITH GFR
ALT: 20 U/L (ref 0–44)
AST: 20 U/L (ref 15–41)
Albumin: 4.8 g/dL (ref 3.5–5.0)
Alkaline Phosphatase: 74 U/L (ref 38–126)
Anion gap: 10 (ref 5–15)
BUN: 7 mg/dL (ref 6–20)
CO2: 27 mmol/L (ref 22–32)
Calcium: 9.7 mg/dL (ref 8.9–10.3)
Chloride: 102 mmol/L (ref 98–111)
Creatinine, Ser: 0.75 mg/dL (ref 0.44–1.00)
GFR, Estimated: >60 mL/min (ref >60)
Glucose, Bld: 94 mg/dL (ref 70–99)
Potassium: 4.4 mmol/L (ref 3.5–5.1)
Sodium: 139 mmol/L (ref 135–145)
Total Bilirubin: 0.7 mg/dL (ref 0.0–1.2)
Total Protein: 7.1 g/dL (ref 6.5–8.1)

## 2023-12-14 LAB — LIPASE, BLOOD: Lipase: 19 U/L (ref 11–51)

## 2023-12-14 MED ORDER — IOHEXOL 350 MG/ML SOLN
100.0000 mL | Freq: Once | INTRAVENOUS | Status: AC | PRN
  Administered 2023-12-14: 100 mL via INTRAVENOUS

## 2023-12-14 MED ORDER — FLEET ENEMA RE ENEM: 1.0000 | ENEMA | Freq: Once | RECTAL | 0 refills | Status: AC

## 2023-12-14 NOTE — ED Triage Notes (Signed)
 Constipation. Last BM 9 days ago. Discomfort. Chronic narcotic use, uses relastir Nausea No relief with miralax

## 2023-12-14 NOTE — Discharge Instructions (Addendum)
 Your blood counts, electrolytes, kidney, liver, and pancreas labs are normal today.  Your CT showed that your stent has good blood flow.  No bowel obstruction was seen.  Your abdominal pain is likely due to constipation.  Your CT scan results are below for your reference.  Drink plenty of fluids at home and eat a fiber fillet diet (lots of fruits and vegetables). Please engage in daily exercise as this also helps to maintain regular bowel movements.   Try to use the bathroom after eating a meal. Place your feet up on a small stepstool when trying to have a bowel movement.  Take 3 scoops of Miralax in the morning until you have a bowel movement, then you may decrease to 1 scoop of MiraLAX daily.  You may also take Ducolax (Bisacodyl) 2 tablets (10mg ) once daily in the morning. These are medications you can get over the counter at any drugstore.   You have also been prescribed an enema to use if the MiraLAX is not helping you to have a bowel movement.  Return to the ER if you still have not had a bowel movement within the next 3 days, you begin vomiting, you no longer are passing gas.  Please follow up with your PCP within the next month to address strategies to prevent constipation in the future.    "EXAM: CTA ABDOMEN AND PELVIS WITHOUT AND WITH CONTRAST   TECHNIQUE: Multidetector CT imaging of the abdomen and pelvis was performed using the standard protocol during bolus administration of intravenous contrast. Multiplanar reconstructed images and MIPs were obtained and reviewed to evaluate the vascular anatomy.   RADIATION DOSE REDUCTION: This exam was performed according to the departmental dose-optimization program which includes automated exposure control, adjustment of the mA and/or kV according to patient size and/or use of iterative reconstruction technique.   CONTRAST:  OMNIPAQUE IOHEXOL 350 MG/ML SOLN   COMPARISON:  09/21/2023   FINDINGS: VASCULAR   Aorta:  Atherosclerotic irregularity with calcified and noncalcified plaque throughout, most pronounced in the infrarenal aorta. No aneurysm or dissection.   Celiac: Patent without evidence of aneurysm, dissection, vasculitis or significant stenosis.   SMA: Stent in place within the proximal SMA which is widely patent and unchanged since prior study. No aneurysm or dissection.   Renals: Both renal arteries are patent without evidence of aneurysm, dissection, vasculitis, fibromuscular dysplasia or significant stenosis.   IMA: Patent   Inflow: Atherosclerotic irregularity with mild narrowing at the origin of the common iliac arteries bilaterally, stable. No aneurysm or dissection.   Proximal Outflow: Mild atherosclerotic calcifications. No aneurysm, dissection or focal stenosis.   Veins: No obvious venous abnormality within the limitations of this arterial phase study. No acute abnormality   Review of the MIP images confirms the above findings.   NON-VASCULAR   Lower chest: No acute findings.   Hepatobiliary: No focal hepatic abnormality. Gallbladder unremarkable.   Pancreas: No focal abnormality or ductal dilatation.   Spleen: No focal abnormality.  Normal size.   Adrenals/Urinary Tract: No adrenal abnormality. No focal renal abnormality. No stones or hydronephrosis. Urinary bladder is unremarkable.   Stomach/Bowel: Stomach, large and small bowel grossly unremarkable. Normal appendix.   Lymphatic: No adenopathy   Reproductive: Uterus and adnexa unremarkable.  No mass.   Other: No free fluid or free air.   Musculoskeletal: No acute bony abnormality. Postoperative changes in the lower lumbar spine.   IMPRESSION: VASCULAR   Aortoiliac atherosclerosis. Stable mild narrowing at the origin of the  common iliac arteries bilaterally. No aneurysm or dissection.   Stable widely patent SMA stent.   NON-VASCULAR   No acute findings in the abdomen or pelvis.      Electronically Signed   By: Charlett Nose M.D.   On: 12/14/2023 21:32"

## 2023-12-14 NOTE — ED Notes (Signed)
 ED Provider at bedside.

## 2023-12-14 NOTE — ED Provider Notes (Signed)
 [place] EMERGENCY DEPARTMENT AT [facility] Provider Note   CSN: [id number] Arrival date & time: 12/14/23  1835     History  Chief Complaint  Patient presents with   Constipation    Christina Cantrell is a 63 y.o. female with history of MS on chronic Bentyl patches for pain, SMA stenosis with stent placement, presents with concern for constipation.  States she had a very small bowel movement about 2 days ago, but her last complete bowel movement was about 9 days ago.  She reports associated abdominal discomfort.  She is still passing gas, no nausea or vomiting.  She tried 1 scoop of MiraLAX which did not relieve her symptoms.  She has been taking her methylnaltrexone regularly without relief of her symptoms.  Denies any fevers, dysuria, hematuria.    Constipation      Home Medications Prior to Admission medications   Medication Sig Start Date End Date Taking? Authorizing Provider  sodium phosphate (FLEET) ENEM Place 133 mLs (1 enema total) rectally once for 1 dose. 12/14/23 12/14/23 Yes Rexie Catena, PA-C  albuterol (VENTOLIN HFA) 108 (90 Base) MCG/ACT inhaler Inhale 2 puffs into the lungs every 6 (six) hours as needed for wheezing or shortness of breath. 07/10/23   Mercy Stall, MD  aspirin EC 81 MG tablet Take 81 mg by mouth daily. Swallow whole.    [provider]  clopidogrel (PLAVIX) 75 MG tablet TAKE 1 TABLET BY MOUTH EVERY DAY 08/13/23   Cox, Kirsten, MD  cyanocobalamin (VITAMIN B12) 1000 MCG/ML injection INJECT 1 ML (1,000 MCG TOTAL) INTO THE MUSCLE EVERY 30 DAYS. 10/26/22   Cox, Burleigh Carp, MD  cyclobenzaprine (FLEXERIL) 10 MG tablet Take 10 mg by mouth 3 (three) times daily as needed for muscle spasms. 07/31/20   [provider]  diltiazem (CARDIZEM CD) 180 MG 24 hr capsule Take 1 capsule (180 mg total) by mouth daily. 06/27/23   Hassan Links, MD  doxycycline (VIBRAMYCIN) 50 MG capsule Take 50 mg by mouth 2 (two) times daily. 11/04/23   [provider]  fentaNYL (DURAGESIC) 75 MCG/HR Place 1 patch onto the skin 2 days.    [provider]  metroNIDAZOLE (METROCREAM) 0.75 % cream Apply 1 Application topically 2 (two) times daily. 09/07/21   [provider]  neomycin-polymyxin-hydrocortisone (CORTISPORIN) OTIC solution Place 3 drops into both ears 4 (four) times daily. 11/20/23   Janece Means, FNP  nitroGLYCERIN (NITROSTAT) 0.4 MG SL tablet Place 0.4 mg under the tongue every 5 (five) minutes as needed for chest pain.    [provider]  norethindrone-ethinyl estradiol (FYAVOLV) 1-5 MG-MCG TABS tablet Take 1 tablet by mouth daily.    [provider]  omeprazole (PRILOSEC) 40 MG capsule TAKE 1 CAPSULE (40 MG TOTAL) BY MOUTH DAILY. 05/16/23   Cox, Burleigh Carp, MD  oxyCODONE-acetaminophen (PERCOCET) 10-325 MG tablet Take 1 tablet by mouth every 4 (four) hours as needed for pain. 08/13/20   [provider]  Polyethyl Glycol-Propyl Glycol (SYSTANE OP) Place 1 drop into both eyes daily as needed (dry eyes).    [provider]  RELISTOR 150 MG TABS Take 3 tablets by mouth every morning.    [provider]  rosuvastatin (CRESTOR) 10 MG tablet Take 10 mg by mouth daily.    [provider]  Vitamin D, Ergocalciferol, (DRISDOL) 1.25 MG (50000 UNIT) CAPS capsule TAKE 1 CAPSULE (50,000 UNITS TOTAL) BY MOUTH TWO TIMES A WEEK 08/27/23   Mercy Stall, MD  Allergies    Zetia [ezetimibe], Atorvastatin, Nexletol [bempedoic acid], Sertraline hcl, and Repatha [evolocumab]    Review of Systems   Review of Systems  Gastrointestinal:  Positive for constipation.    Physical Exam Updated Vital Signs BP 131/72   Pulse 68   Temp 97.9 F (36.6 C)   Resp 19   SpO2 98%  Physical Exam Vitals and nursing note reviewed.  Constitutional:      General: She is not in acute distress.    Appearance: She is well-developed.  HENT:     Head: Normocephalic and atraumatic.  Eyes:      Conjunctiva/sclera: Conjunctivae normal.  Cardiovascular:     Rate and Rhythm: Normal rate and regular rhythm.     Heart sounds: No murmur heard. Pulmonary:     Effort: Pulmonary effort is normal. No respiratory distress.     Breath sounds: Normal breath sounds.  Abdominal:     Palpations: Abdomen is soft.     Tenderness: There is no abdominal tenderness.     Comments: Abdomen is soft and nontender, no rebound or guarding  Musculoskeletal:        General: No swelling.     Cervical back: Neck supple.  Skin:    General: Skin is warm and dry.     Capillary Refill: Capillary refill takes less than 2 seconds.  Neurological:     Mental Status: She is alert.  Psychiatric:        Mood and Affect: Mood normal.     ED Results / Procedures / Treatments   Labs (all labs ordered are listed, but only abnormal results are displayed) Labs Reviewed  CBC WITH DIFFERENTIAL/PLATELET  COMPREHENSIVE METABOLIC PANEL WITH GFR  LIPASE, BLOOD    EKG None  Radiology CT Angio Abd/Pel w/ and/or w/o Result Date: 12/14/2023 CLINICAL DATA:  Abdominal pain. Assess stent for possible mesenteric ischemia. EXAM: CTA ABDOMEN AND PELVIS WITHOUT AND WITH CONTRAST TECHNIQUE: Multidetector CT imaging of the abdomen and pelvis was performed using the standard protocol during bolus administration of intravenous contrast. Multiplanar reconstructed images and MIPs were obtained and reviewed to evaluate the vascular anatomy. RADIATION DOSE REDUCTION: This exam was performed according to the departmental dose-optimization program which includes automated exposure control, adjustment of the mA and/or kV according to patient size and/or use of iterative reconstruction technique. CONTRAST:  OMNIPAQUE IOHEXOL 350 MG/ML SOLN COMPARISON:  09/21/2023 FINDINGS: VASCULAR Aorta: Atherosclerotic irregularity with calcified and noncalcified plaque throughout, most pronounced in the infrarenal aorta. No aneurysm or dissection.  Celiac: Patent without evidence of aneurysm, dissection, vasculitis or significant stenosis. SMA: Stent in place within the proximal SMA which is widely patent and unchanged since prior study. No aneurysm or dissection. Renals: Both renal arteries are patent without evidence of aneurysm, dissection, vasculitis, fibromuscular dysplasia or significant stenosis. IMA: Patent Inflow: Atherosclerotic irregularity with mild narrowing at the origin of the common iliac arteries bilaterally, stable. No aneurysm or dissection. Proximal Outflow: Mild atherosclerotic calcifications. No aneurysm, dissection or focal stenosis. Veins: No obvious venous abnormality within the limitations of this arterial phase study. No acute abnormality Review of the MIP images confirms the above findings. NON-VASCULAR Lower chest: No acute findings. Hepatobiliary: No focal hepatic abnormality. Gallbladder unremarkable. Pancreas: No focal abnormality or ductal dilatation. Spleen: No focal abnormality.  Normal size. Adrenals/Urinary Tract: No adrenal abnormality. No focal renal abnormality. No stones or hydronephrosis. Urinary bladder is unremarkable. Stomach/Bowel: Stomach, large and small bowel grossly unremarkable. Normal appendix. Lymphatic: No adenopathy Reproductive:  Uterus and adnexa unremarkable.  No mass. Other: No free fluid or free air. Musculoskeletal: No acute bony abnormality. Postoperative changes in the lower lumbar spine. IMPRESSION: VASCULAR Aortoiliac atherosclerosis. Stable mild narrowing at the origin of the common iliac arteries bilaterally. No aneurysm or dissection. Stable widely patent SMA stent. NON-VASCULAR No acute findings in the abdomen or pelvis. Electronically Signed   By: Janeece Mechanic M.D.   On: 12/14/2023 21:32    Procedures Procedures    Medications Ordered in ED Medications  iohexol (OMNIPAQUE) 350 MG/ML injection 100 mL (100 mLs Intravenous Contrast Given 12/14/23 2006)    ED Course/ Medical Decision  Making/ A&P                                 Medical Decision Making Amount and/or Complexity of Data Reviewed Labs: ordered. Radiology: ordered.  Risk OTC drugs. Prescription drug management.     Differential diagnosis includes but is not limited to Cholelithiasis, cholangitis, choledocholithiasis, peptic ulcer, gastritis, gastroenteritis, appendicitis, IBS, IBD, DKA, nephrolithiasis, UTI, pyelonephritis, pancreatitis, diverticulitis, mesenteric ischemia, abdominal aortic aneurysm, small bowel obstruction, volvulus   ED Course:  Upon initial evaluation, patient is well-appearing, stable vital signs.  Reporting concern for constipation and some abdominal pain.  Abdomen is soft and nontender upon my evaluation.  No active vomiting.  Labs Ordered: I Ordered, and personally interpreted labs.  The pertinent results include:   CBC, CMP, and lipase all within normal limits  Imaging Studies ordered: I ordered imaging studies including CTA abdomen I independently visualized the imaging with scope of interpretation limited to determining acute life threatening conditions related to emergency care. Imaging showed patent SMA stent, no signs of bowel obstruction I agree with the radiologist interpretation  Upon re-evaluation, patient still well-appearing, stable vital signs.  Discussed that her labs and imaging are overall unremarkable, very low concern for acute intra-abdominal pathology.  No concern for mesenteric ischemia as there are no signs on CT and patient overall very comfortable appearing.  No signs of small bowel obstruction as she is still passing flatus and this is not noted on CT.  Discussed that her abdominal pain is likely coming from her constipation.  Discussed that we can try an enema here in the ER to help her have a bowel movement, or send her home with medications.  She would like to try home medications first.  Stable and appropriate for discharge  home    Impression: Constipation  Disposition:  The patient was discharged home with instructions to continue taking her methylnaltrexone as prescribed.  Add in 3 scoops of MiraLAX daily until she has a full bowel movement.  May also use Fleet enema as prescribed if she is not having a bowel movement with the MiraLAX. Return precautions given.     This chart was dictated using voice recognition software, Dragon. Despite the best efforts of this provider to proofread and correct errors, errors may still occur which can change documentation meaning.          Final Clinical Impression(s) / ED Diagnoses Final diagnoses:  Other constipation    Rx / DC Orders ED Discharge Orders          Ordered    sodium phosphate (FLEET) ENEM   Once        12/14/23 2255              Rexie Catena, PA-C 12/14/23 2343  Mozell Arias, MD 12/15/23 1444

## 2023-12-18 ENCOUNTER — Other Ambulatory Visit: Payer: Self-pay | Admitting: Family Medicine

## 2023-12-18 DIAGNOSIS — E7801 Familial hypercholesterolemia: Secondary | ICD-10-CM

## 2023-12-19 ENCOUNTER — Telehealth: Payer: Self-pay

## 2023-12-19 ENCOUNTER — Ambulatory Visit (INDEPENDENT_AMBULATORY_CARE_PROVIDER_SITE_OTHER): Admitting: Family Medicine

## 2023-12-19 ENCOUNTER — Encounter: Payer: Self-pay | Admitting: Family Medicine

## 2023-12-19 VITALS — BP 110/68 | HR 97 | Temp 98.0°F | Resp 16 | Ht 64.0 in | Wt 129.6 lb

## 2023-12-19 DIAGNOSIS — R112 Nausea with vomiting, unspecified: Secondary | ICD-10-CM | POA: Diagnosis not present

## 2023-12-19 DIAGNOSIS — R1084 Generalized abdominal pain: Secondary | ICD-10-CM | POA: Diagnosis not present

## 2023-12-19 DIAGNOSIS — K5903 Drug induced constipation: Secondary | ICD-10-CM

## 2023-12-19 MED ORDER — FLEET ENEMA RE ENEM: 1.0000 | ENEMA | Freq: Once | RECTAL | 0 refills | Status: DC

## 2023-12-19 NOTE — Telephone Encounter (Signed)
 Patient called in. She stated that she went to [facility] medical center for nausea, vomiting, diarrhea and general malaise. Patient stated they did a CT scan and it was clear, also checked her stent that she has in her colon and it was good. However she is still having same symptoms. She can't keep anything down. She is wondering if she needs to be seen or if something could be sent in for her? Please advise?

## 2023-12-19 NOTE — Telephone Encounter (Signed)
 Patient came today for a visit. Dr. Reinhold Carbine

## 2023-12-19 NOTE — Progress Notes (Unsigned)
 Acute Office Visit  Subjective:    Patient ID: Christina Cantrell, female    DOB: [birth year], 63 y.o.   MRN: [id number]  Chief Complaint  Patient presents with  . Abdominal Pain    constipation    Discussed the use of AI scribe software for clinical note transcription with the patient, who gave verbal consent to proceed.  HPI: Patient is in today for constipation x one week and a half. Patient states she has history of stent placement in her superior mesenteric artery. Patient states she can not eat or drink anything and has a generalized feeling of malaise.  Past Medical History:  Diagnosis Date  . Angina pectoris (HCC) 03/16/2022  . B12 deficiency 09/13/2022  . Central sleep apnea comorbid with prescribed opioid use, moderate 10/30/2022  . Central sleep apnea syndrome 05/30/2018  . Centrilobular emphysema (HCC) 10/04/2021  . Cervical radiculopathy 07/21/2021  . Chronic low back pain 06/04/2013  . Degeneration of lumbar intervertebral disc 06/04/2013  . Epigastric pain 10/04/2021  . Esophageal dysphagia 02/13/2022  . Ex-smoker 03/16/2022  . Familial hypercholesterolemia 12/22/2022  . Familial hyperlipidemia 06/27/2021  . GERD without esophagitis 09/13/2022  . Localized osteoporosis without current pathological fracture 06/27/2021  . Mixed hyperlipidemia 06/27/2021  . Multiple sclerosis (HCC) 1991   Age 20 yo  . Need for influenza vaccination 05/28/2022  . Neuropathic pain 11/12/2017  . Optic neuritis 09/17/2018  . Osteoarthritis 09/17/2018  . Osteopenia 11/20/2018  . Other chest pain 02/18/2022  . Pain in thoracic spine 06/04/2013  . Pain of upper abdomen 10/04/2021  . Paresthesia 02/13/2022  . Polyuria 02/18/2022  . Screening for lung cancer 12/29/2021  . Spinal stenosis 09/17/2018  . 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 Therapeutic opioid induced constipation 02/18/2022  . Therapeutic opioid-induced constipation  (OIC) 02/18/2022  . Uncomplicated opioid dependence (HCC) 09/12/2021  . Vitamin B12 deficiency 05/28/2022  . Vitamin D deficiency 06/27/2021    Past Surgical History:  Procedure Laterality Date  . BREAST BIOPSY Bilateral 08/24/2020  . BREAST EXCISIONAL BIOPSY Right 1975  . IR ANGIOGRAM VISCERAL SELECTIVE  03/02/2022  . IR RADIOLOGIST EVAL & MGMT  02/21/2022  . IR RADIOLOGIST EVAL & MGMT  04/03/2022  . IR RADIOLOGIST EVAL & MGMT  06/12/2022  . IR RADIOLOGIST EVAL & MGMT  05/09/2023  . IR RADIOLOGIST EVAL & MGMT  09/27/2023  . IR TRANSCATH PLC STENT 1ST ART NOT LE CV CAR VERT CAR  03/02/2022  . IR US GUIDE VASC ACCESS RIGHT  03/02/2022    Family History  Family history unknown: Yes    Social History   Socioeconomic History  . Marital status: Married    Spouse name: Molly Maduro  . Number of children: 1  . Years of education: Not on file  . Highest education level: Associate degree: occupational, Scientist, product/process development, or vocational program  Occupational History  . Not on file  Tobacco Use  . Smoking status: Former    Current packs/day: 0.00    Types: Cigarettes    Start date: 71    Quit date: 2018    Years since quitting: 7.2  . Smokeless tobacco: Never  Vaping Use  . Vaping status: Never Used  Substance and Sexual Activity  . Alcohol use: Never  . Drug use: Never  . Sexual activity: Not on file  Other Topics Concern  . Not on file  Social History Narrative  . Not on file   Social Drivers of  Health   Financial Resource Strain: Low Risk  (07/18/2023)   Overall Financial Resource Strain (CARDIA)   . Difficulty of Paying Living Expenses: Not very hard  Food Insecurity: No Food Insecurity (07/18/2023)   Hunger Vital Sign   . Worried About Programme researcher, broadcasting/film/video in the Last Year: Never true   . Ran Out of Food in the Last Year: Never true  Transportation Needs: No Transportation Needs (08/31/2022)   PRAPARE - Transportation   . Lack of Transportation (Medical): No   . Lack of  Transportation (Non-Medical): No  Physical Activity: Insufficiently Active (07/18/2023)   Exercise Vital Sign   . Days of Exercise per Week: 1 day   . Minutes of Exercise per Session: 30 min  Stress: No Stress Concern Present (07/18/2023)   [employer] of Occupational Health - Occupational Stress Questionnaire   . Feeling of Stress : Not at all  Social Connections: Socially Isolated (07/18/2023)   Social Connection and Isolation Panel [NHANES]   . Frequency of Communication with Friends and Family: Once a week   . Frequency of Social Gatherings with Friends and Family: Once a week   . Attends Religious Services: Never   . Active Member of Clubs or Organizations: No   . Attends Banker Meetings: Never   . Marital Status: Married  Catering manager Violence: Not At Risk (04/05/2023)   Humiliation, Afraid, Rape, and Kick questionnaire   . Fear of Current or Ex-Partner: No   . Emotionally Abused: No   . Physically Abused: No   . Sexually Abused: No    Outpatient Medications Prior to Visit  Medication Sig Dispense Refill  . albuterol (VENTOLIN HFA) 108 (90 Base) MCG/ACT inhaler Inhale 2 puffs into the lungs every 6 (six) hours as needed for wheezing or shortness of breath. 18 g 3  . aspirin EC 81 MG tablet Take 81 mg by mouth daily. Swallow whole.    . clopidogrel (PLAVIX) 75 MG tablet TAKE 1 TABLET BY MOUTH EVERY DAY 90 tablet 0  . cyanocobalamin (VITAMIN B12) 1000 MCG/ML injection INJECT 1 ML (1,000 MCG TOTAL) INTO THE MUSCLE EVERY 30 DAYS. 3 mL 1  . cyclobenzaprine (FLEXERIL) 10 MG tablet Take 10 mg by mouth 3 (three) times daily as needed for muscle spasms.    Aaron Aas diltiazem (CARDIZEM CD) 180 MG 24 hr capsule Take 1 capsule (180 mg total) by mouth daily. 90 capsule 3  . doxycycline (VIBRAMYCIN) 50 MG capsule Take 50 mg by mouth 2 (two) times daily.    . fentaNYL (DURAGESIC) 75 MCG/HR Place 1 patch onto the skin 2 days.    . metroNIDAZOLE (METROCREAM) 0.75 % cream Apply 1  Application topically 2 (two) times daily.    Aaron Aas neomycin-polymyxin-hydrocortisone (CORTISPORIN) OTIC solution Place 3 drops into both ears 4 (four) times daily. 20 mL 0  . nitroGLYCERIN (NITROSTAT) 0.4 MG SL tablet Place 0.4 mg under the tongue every 5 (five) minutes as needed for chest pain.    Aaron Aas norethindrone-ethinyl estradiol (FYAVOLV) 1-5 MG-MCG TABS tablet Take 1 tablet by mouth daily.    Aaron Aas omeprazole (PRILOSEC) 40 MG capsule TAKE 1 CAPSULE (40 MG TOTAL) BY MOUTH DAILY. 90 capsule 1  . oxyCODONE-acetaminophen (PERCOCET) 10-325 MG tablet Take 1 tablet by mouth every 4 (four) hours as needed for pain.    . Polyethyl Glycol-Propyl Glycol (SYSTANE OP) Place 1 drop into both eyes daily as needed (dry eyes).    Aaron Aas RELISTOR 150 MG TABS Take 3  tablets by mouth every morning.    . rosuvastatin (CRESTOR) 10 MG tablet Take 10 mg by mouth daily.    . Vitamin D, Ergocalciferol, (DRISDOL) 1.25 MG (50000 UNIT) CAPS capsule TAKE 1 CAPSULE (50,000 UNITS TOTAL) BY MOUTH TWO TIMES A WEEK 24 capsule 1   No facility-administered medications prior to visit.    Allergies  Allergen Reactions  . Zetia [Ezetimibe]     Right knee swelling within one day.   . Atorvastatin     Throat closed up.   Aaron Aas Nexletol [Bempedoic Acid]     Left knee pain   . Sertraline Hcl Hives  . Repatha [Evolocumab] Other (See Comments)    Headache and Brain Fog    Review of Systems  Constitutional:  Positive for chills and fatigue. Negative for diaphoresis and fever.  HENT:  Negative for congestion, ear pain and sinus pain.   Eyes: Negative.   Respiratory:  Negative for cough and shortness of breath.   Cardiovascular:  Negative for chest pain.  Gastrointestinal:  Positive for abdominal pain (intermittant), constipation, diarrhea, nausea, rectal pain and vomiting. Negative for blood in stool.  Endocrine: Negative.   Genitourinary:  Negative for dysuria and vaginal pain.  Musculoskeletal:  Positive for back pain. Negative for  arthralgias.  Allergic/Immunologic: Negative.   Neurological:  Negative for weakness and headaches.  Hematological: Negative.   Psychiatric/Behavioral:  Negative for dysphoric mood. The patient is not nervous/anxious.        Objective:        12/19/2023    3:54 PM 12/14/2023   10:44 PM 12/14/2023   10:30 PM  Vitals with BMI  Height 5\' 4"     Weight 129 lbs 10 oz    BMI 22.23    Systolic 110 131 161  Diastolic 68 72 72  Pulse 97 68 92    No data found.   Physical Exam Vitals reviewed.  Constitutional:      General: She is not in acute distress.    Appearance: She is ill-appearing.  Cardiovascular:     Heart sounds: Normal heart sounds. No murmur heard. Pulmonary:     Effort: Pulmonary effort is normal.     Breath sounds: Normal breath sounds. No wheezing.  Abdominal:     General: Bowel sounds are increased. There is distension.     Tenderness: There is generalized abdominal tenderness. There is guarding.  Skin:    General: Skin is warm.  Neurological:     General: No focal deficit present.     Mental Status: She is alert and oriented to person, place, and time.  Psychiatric:        Mood and Affect: Mood is anxious.    Health Maintenance Due  Topic Date Due  . Zoster Vaccines- Shingrix (1 of 2) Never done  . COVID-19 Vaccine (7 - Moderna risk 2024-25 season) 11/25/2023    There are no preventive care reminders to display for this patient.   Lab Results  Component Value Date   TSH 3.150 07/18/2023   Lab Results  Component Value Date   WBC 6.5 12/14/2023   HGB 15.0 12/14/2023   HCT 44.7 12/14/2023   MCV 89.4 12/14/2023   PLT 365 12/14/2023   Lab Results  Component Value Date   NA 139 12/14/2023   K 4.4 12/14/2023   CO2 27 12/14/2023   GLUCOSE 94 12/14/2023   BUN 7 12/14/2023   CREATININE 0.75 12/14/2023   BILITOT 0.7 12/14/2023   ALKPHOS 74  12/14/2023   AST 20 12/14/2023   ALT 20 12/14/2023   PROT 7.1 12/14/2023   ALBUMIN 4.8 12/14/2023    CALCIUM 9.7 12/14/2023   ANIONGAP 10 12/14/2023   EGFR 75 10/29/2023   Lab Results  Component Value Date   CHOL 186 10/29/2023   Lab Results  Component Value Date   HDL 73 10/29/2023   Lab Results  Component Value Date   LDLCALC 92 10/29/2023   Lab Results  Component Value Date   TRIG 124 10/29/2023   Lab Results  Component Value Date   CHOLHDL 2.5 10/29/2023   No results found for: "HGBA1C"     Assessment & Plan:  There are no diagnoses linked to this encounter.   Assessment and Plan       No orders of the defined types were placed in this encounter.   No orders of the defined types were placed in this encounter.    Follow-up: No follow-ups on file.  An After Visit Summary was printed and given to the patient.  Janece Means, FNP [facility] [phone]

## 2023-12-20 ENCOUNTER — Ambulatory Visit (HOSPITAL_BASED_OUTPATIENT_CLINIC_OR_DEPARTMENT_OTHER)
Admit: 2023-12-20 | Discharge: 2023-12-20 | Disposition: A | Discharge status: Home / Self Care | Attending: Family Medicine | Admitting: Family Medicine

## 2023-12-20 DIAGNOSIS — K5903 Drug induced constipation: Secondary | ICD-10-CM

## 2023-12-20 DIAGNOSIS — R109 Unspecified abdominal pain: Secondary | ICD-10-CM | POA: Diagnosis not present

## 2023-12-20 DIAGNOSIS — R112 Nausea with vomiting, unspecified: Secondary | ICD-10-CM | POA: Insufficient documentation

## 2023-12-20 DIAGNOSIS — R1084 Generalized abdominal pain: Secondary | ICD-10-CM | POA: Insufficient documentation

## 2023-12-20 LAB — CBC WITH DIFFERENTIAL/PLATELET
Basophils Absolute: 0.1 10*3/uL (ref 0.0–0.2)
Basos: 1 %
EOS (ABSOLUTE): 0.1 10*3/uL (ref 0.0–0.4)
Eos: 1 %
Hematocrit: 45.4 % (ref 34.0–46.6)
Hemoglobin: 15.1 g/dL (ref 11.1–15.9)
Immature Grans (Abs): 0 10*3/uL (ref 0.0–0.1)
Immature Granulocytes: 0 %
Lymphocytes Absolute: 2.7 10*3/uL (ref 0.7–3.1)
Lymphs: 42 %
MCH: 30.4 pg (ref 26.6–33.0)
MCHC: 33.3 g/dL (ref 31.5–35.7)
MCV: 92 fL (ref 79–97)
Monocytes Absolute: 0.5 10*3/uL (ref 0.1–0.9)
Monocytes: 8 %
Neutrophils Absolute: 3.2 10*3/uL (ref 1.4–7.0)
Neutrophils: 48 %
Platelets: 393 10*3/uL (ref 150–450)
RBC: 4.96 x10E6/uL (ref 3.77–5.28)
RDW: 12.5 % (ref 11.7–15.4)
WBC: 6.6 10*3/uL (ref 3.4–10.8)

## 2023-12-20 LAB — COMPREHENSIVE METABOLIC PANEL WITH GFR
ALT: 16 IU/L (ref 0–32)
AST: 16 IU/L (ref 0–40)
Albumin: 4.6 g/dL (ref 3.8–4.9)
Alkaline Phosphatase: 94 IU/L (ref 44–121)
BUN/Creatinine Ratio: 10 — ABNORMAL LOW (ref 12–28)
BUN: 8 mg/dL (ref 8–27)
Bilirubin Total: 0.4 mg/dL (ref 0.0–1.2)
CO2: 23 mmol/L (ref 20–29)
Calcium: 9.9 mg/dL (ref 8.7–10.3)
Chloride: 102 mmol/L (ref 96–106)
Creatinine, Ser: 0.77 mg/dL (ref 0.57–1.00)
Globulin, Total: 2.1 g/dL (ref 1.5–4.5)
Glucose: 95 mg/dL (ref 70–99)
Potassium: 4.3 mmol/L (ref 3.5–5.2)
Sodium: 142 mmol/L (ref 134–144)
Total Protein: 6.7 g/dL (ref 6.0–8.5)
eGFR: 88 mL/min/{1.73_m2} (ref >59)

## 2023-12-20 LAB — LIPASE: Lipase: 18 U/L (ref 14–72)

## 2023-12-20 LAB — AMYLASE: Amylase: 80 U/L (ref 31–110)

## 2023-12-20 NOTE — Assessment & Plan Note (Signed)
 Abdominal pain and constipation likely due to fentanyl patch use. CT scan showed stool presence without obstruction. Mesenteric stent patent, ruling out ischemia. Differential includes bowel obstruction, dehydration, pancreatitis. - Order abdominal x-ray stat to assess stool burden. - Administer Miralax and fleets enema. - Perform CBC, CMP, amylase, and lipase to rule out pancreatitis. - Encourage hydration with Pedialyte, especially frozen popsicles.

## 2023-12-20 NOTE — Assessment & Plan Note (Signed)
 Acute - Administer Miralax and fleets enema.  - order abdominal xray STAT to determine obstruction or extent of stool burden

## 2023-12-20 NOTE — Assessment & Plan Note (Signed)
 Nausea and vomiting possibly related to Miralax use or underlying condition like pancreatitis. - Encourage small, frequent sips of fluids to maintain hydration.

## 2023-12-25 ENCOUNTER — Other Ambulatory Visit: Payer: Self-pay | Admitting: Family Medicine

## 2023-12-26 ENCOUNTER — Ambulatory Visit: Admitting: Family Medicine

## 2023-12-26 ENCOUNTER — Encounter: Payer: Self-pay | Admitting: Family Medicine

## 2023-12-26 VITALS — BP 120/72 | HR 86 | Temp 97.9°F | Resp 16 | Ht 64.0 in | Wt 134.0 lb

## 2023-12-26 DIAGNOSIS — E782 Mixed hyperlipidemia: Secondary | ICD-10-CM | POA: Diagnosis not present

## 2023-12-26 DIAGNOSIS — M816 Localized osteoporosis [Lequesne]: Secondary | ICD-10-CM | POA: Diagnosis not present

## 2023-12-26 DIAGNOSIS — E538 Deficiency of other specified B group vitamins: Secondary | ICD-10-CM | POA: Diagnosis not present

## 2023-12-26 DIAGNOSIS — R9389 Abnormal findings on diagnostic imaging of other specified body structures: Secondary | ICD-10-CM | POA: Diagnosis not present

## 2023-12-26 MED ORDER — ROSUVASTATIN CALCIUM 10 MG PO TABS: 10.0000 mg | ORAL_TABLET | Freq: Every day | ORAL | 1 refills | Status: DC

## 2023-12-26 MED ORDER — ROSUVASTATIN CALCIUM 20 MG PO TABS: 20.0000 mg | ORAL_TABLET | Freq: Every day | ORAL | 3 refills | Status: DC

## 2023-12-26 MED ORDER — CYANOCOBALAMIN 1000 MCG/ML IJ SOLN
1000.0000 ug | Freq: Once | INTRAMUSCULAR | Status: AC
  Administered 2023-12-26: 1000 ug via INTRAMUSCULAR

## 2023-12-26 NOTE — Assessment & Plan Note (Addendum)
 Poor bone density, untreated, may contribute to lumbar spine issues. - May consider medication after neurosurgeon appointment

## 2023-12-26 NOTE — Assessment & Plan Note (Signed)
 Not well controlled Intolerant to medication in the past and recently restarted a low dose statin Taking Rosuvastatin  10 mg and would like to increase to 20 mg by mouth once daily.

## 2023-12-26 NOTE — Assessment & Plan Note (Signed)
 Last B12 - 404 IM Injection of B12 given today
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
## 2023-12-27 ENCOUNTER — Other Ambulatory Visit: Payer: Self-pay | Admitting: Family Medicine

## 2024-01-02 DIAGNOSIS — F1120 Opioid dependence, uncomplicated: Secondary | ICD-10-CM | POA: Diagnosis not present

## 2024-01-02 DIAGNOSIS — M51362 Other intervertebral disc degeneration, lumbar region with discogenic back pain and lower extremity pain: Secondary | ICD-10-CM | POA: Diagnosis not present

## 2024-01-02 DIAGNOSIS — G35 Multiple sclerosis: Secondary | ICD-10-CM | POA: Diagnosis not present

## 2024-01-17 DIAGNOSIS — M546 Pain in thoracic spine: Secondary | ICD-10-CM | POA: Diagnosis not present

## 2024-01-17 DIAGNOSIS — M51362 Other intervertebral disc degeneration, lumbar region with discogenic back pain and lower extremity pain: Secondary | ICD-10-CM | POA: Diagnosis not present

## 2024-01-17 DIAGNOSIS — G8929 Other chronic pain: Secondary | ICD-10-CM | POA: Diagnosis not present

## 2024-01-17 DIAGNOSIS — M542 Cervicalgia: Secondary | ICD-10-CM | POA: Diagnosis not present

## 2024-01-17 DIAGNOSIS — G35 Multiple sclerosis: Secondary | ICD-10-CM | POA: Diagnosis not present

## 2024-01-29 NOTE — Progress Notes (Signed)
 Subjective:  Patient ID: Christina Cantrell, female    DOB: 02-Jun-1961  Age: 63 y.o. MRN: [id number]  Chief Complaint  Patient presents with   Medical Management of Chronic Issues    HPI: Patient is a 63 year old white female with multiple sclerosis, hyperlipidemia, depression who presents for chronic follow-up.  The patient, with a history of multiple sclerosis (MS), MS symptoms, particularly tremors, have improved significantly. However, she reports occasional stomach pain from taking Relistor, a medication for opioid-induced constipation.   Hyperlipidemia/CORONARY ARTERY DISEASE/Aortic atherosclerosis: Patient is on aspirin 81 mg daily and plavix  75 mg daily.  crestor  20 mg before bed. Sometimes will take a 1/2 pill. Patient was seeing Dr. Sandee Crook.    Eating healthy and some exercising  Back pain: Opioid dependence: patient sees pain clinic. On percocet 10/325 mg q4 hours as needed. Duragesic  patches 75 mcg once every 48 hours. On flexeril 10 mg three times a day as needed.   MS: Not seeing a neurologist. Does not wish to start on any new medicine.   Rosacea: doxycycline 50 mg once daily and metrocream apply once daily.   Constipation: Taking miralax every 2-3 days. Abdominal pain is greatly improved.   B12 deficiency: b12 shots monthly.   CopD: Has albuterol  inhaler.      07/18/2023    9:03 AM 04/05/2023   10:27 AM 12/18/2022   11:26 AM 10/04/2021   10:39 AM 09/16/2020    9:54 AM  Depression screen PHQ 2/9  Decreased Interest 0 0 0 0 2  Down, Depressed, Hopeless 0 0 0 0 2  PHQ - 2 Score 0 0 0 0 4  Altered sleeping 3 1  0 3  Tired, decreased energy 3 3  0 3  Change in appetite 0 0  1   Feeling bad or failure about yourself  0 0  0 1  Trouble concentrating 0 0  0 0  Moving slowly or fidgety/restless 0 0  0 0  Suicidal thoughts 0 0  0 0  PHQ-9 Score 6 4  1 11   Difficult doing work/chores Somewhat difficult Not difficult at all  Not difficult at all         04/19/2023     2:11 PM  Fall Risk   Falls in the past year? 0  Number falls in past yr: 0  Injury with Fall? 0  Risk for fall due to : History of fall(s)  Follow up Falls evaluation completed;Education provided    Patient Care Team: Mercy Stall, MD as PCP - General (Family Medicine) Hassan Links, MD as Consulting Physician (Cardiology) C[place]ton Ferebee Desai, Sarah, PA-C as Physician Assistant (Neurosurgery) Misenheimer, Emeterio Hansen, MD as Consulting Physician (Unknown Physician Specialty) Rockie Churchman, OD (Optometry) Beulah Brunt, MD as Referring Physician (Family Medicine)   Review of Systems  Constitutional:  Negative for chills, fatigue and fever.  HENT:  Negative for congestion, ear pain and sore throat.   Respiratory:  Negative for cough and shortness of breath.   Cardiovascular:  Positive for leg swelling (left leg). Negative for chest pain and palpitations.  Gastrointestinal:  Negative for abdominal pain, constipation, diarrhea, nausea and vomiting.  Endocrine: Negative for polydipsia, polyphagia and polyuria.  Genitourinary:  Negative for difficulty urinating and dysuria.  Musculoskeletal:  Negative for arthralgias, back pain and myalgias.  Skin:  Negative for rash.  Neurological:  Negative for headaches.  Psychiatric/Behavioral:  Negative for dysphoric mood. The patient is not nervous/anxious.     Current Outpatient  Medications on File Prior to Visit  Medication Sig Dispense Refill   albuterol  (VENTOLIN  HFA) 108 (90 Base) MCG/ACT inhaler Inhale 2 puffs into the lungs every 6 (six) hours as needed for wheezing or shortness of breath. 18 g 3   aspirin EC 81 MG tablet Take 81 mg by mouth daily. Swallow whole.     cyanocobalamin  (VITAMIN B12) 1000 MCG/ML injection INJECT 1 ML (1,000 MCG) INTRAMUSCULARLY EVERY 30 DAYS 3 mL 1   cyclobenzaprine (FLEXERIL) 10 MG tablet Take 10 mg by mouth 3 (three) times daily as needed for muscle spasms.     doxycycline (VIBRAMYCIN) 50 MG capsule Take 50  mg by mouth 2 (two) times daily.     fentaNYL  (DURAGESIC ) 75 MCG/HR Place 1 patch onto the skin 2 days.     metroNIDAZOLE (METROCREAM) 0.75 % cream Apply 1 Application topically 2 (two) times daily.     modafinil (PROVIGIL) 100 MG tablet Take 100 mg by mouth 2 (two) times daily as needed (as needed).     nitroGLYCERIN  (NITROSTAT ) 0.4 MG SL tablet Place 0.4 mg under the tongue every 5 (five) minutes as needed for chest pain.     omeprazole  (PRILOSEC) 40 MG capsule TAKE 1 CAPSULE (40 MG TOTAL) BY MOUTH DAILY. 90 capsule 1   oxyCODONE-acetaminophen (PERCOCET) 10-325 MG tablet Take 1 tablet by mouth every 4 (four) hours as needed for pain.     Polyethyl Glycol-Propyl Glycol (SYSTANE OP) Place 1 drop into both eyes daily as needed (dry eyes).     rosuvastatin  (CRESTOR ) 20 MG tablet Take 1 tablet (20 mg total) by mouth daily. 90 tablet 3   Vitamin D , Ergocalciferol , (DRISDOL ) 1.25 MG (50000 UNIT) CAPS capsule TAKE 1 CAPSULE (50,000 UNITS TOTAL) BY MOUTH TWO TIMES A WEEK 24 capsule 1   No current facility-administered medications on file prior to visit.   Past Medical History:  Diagnosis Date   Angina pectoris (HCC) 03/16/2022   B12 deficiency 09/13/2022   Central sleep apnea comorbid with prescribed opioid use, moderate 10/30/2022   Central sleep apnea syndrome 05/30/2018   Centrilobular emphysema (HCC) 10/04/2021   Cervical radiculopathy 07/21/2021   Chronic low back pain 06/04/2013   Degeneration of lumbar intervertebral disc 06/04/2013   Epigastric pain 10/04/2021   Esophageal dysphagia 02/13/2022   Ex-smoker 03/16/2022   Familial hypercholesterolemia 12/22/2022   Familial hyperlipidemia 06/27/2021   GERD without esophagitis 09/13/2022   Localized osteoporosis without current pathological fracture 06/27/2021   Mixed hyperlipidemia 06/27/2021   Multiple sclerosis (HCC) 1991   Age 18 yo   Need for influenza vaccination 05/28/2022   Neuropathic pain 11/12/2017   Optic neuritis 09/17/2018    Osteoarthritis 09/17/2018   Osteopenia 11/20/2018   Other chest pain 02/18/2022   Pain in thoracic spine 06/04/2013   Pain of upper abdomen 10/04/2021   Paresthesia 02/13/2022   Polyuria 02/18/2022   Screening for lung cancer 12/29/2021   Spinal stenosis 09/17/2018   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   Therapeutic opioid induced constipation 02/18/2022   Therapeutic opioid-induced constipation (OIC) 02/18/2022   Uncomplicated opioid dependence (HCC) 09/12/2021   Vitamin B12 deficiency 05/28/2022   Vitamin D  deficiency 06/27/2021   Past Surgical History:  Procedure Laterality Date   BREAST BIOPSY Bilateral 08/24/2020   BREAST EXCISIONAL BIOPSY Right 1975   IR ANGIOGRAM VISCERAL SELECTIVE  03/02/2022   IR RADIOLOGIST EVAL & MGMT  02/21/2022   IR RADIOLOGIST EVAL & MGMT  04/03/2022  IR RADIOLOGIST EVAL & MGMT  06/12/2022   IR RADIOLOGIST EVAL & MGMT  05/09/2023   IR RADIOLOGIST EVAL & MGMT  09/27/2023   IR TRANSCATH PLC STENT 1ST ART NOT LE CV CAR VERT CAR  03/02/2022   IR US  GUIDE VASC ACCESS RIGHT  03/02/2022    Family History  Family history unknown: Yes   Social History   Socioeconomic History   Marital status: Married    Spouse name: Porfirio Bristol   Number of children: 1   Years of education: Not on file   Highest education level: Associate degree: occupational, Scientist, product/process development, or vocational program  Occupational History   Not on file  Tobacco Use   Smoking status: Former    Current packs/day: 0.00    Types: Cigarettes    Start date: 1998    Quit date: 2018    Years since quitting: 7.4   Smokeless tobacco: Never  Vaping Use   Vaping status: Never Used  Substance and Sexual Activity   Alcohol use: Yes    Alcohol/week: 1.0 standard drink of alcohol    Types: 1 Glasses of wine per week    Comment: socially   Drug use: Never   Sexual activity: Not Currently    Partners: Male  Other Topics Concern   Not on file  Social  History Narrative   Not on file   Social Drivers of Health   Financial Resource Strain: Low Risk  (07/18/2023)   Overall Financial Resource Strain (CARDIA)    Difficulty of Paying Living Expenses: Not very hard  Food Insecurity: No Food Insecurity (07/18/2023)   Hunger Vital Sign    Worried About Running Out of Food in the Last Year: Never true    Ran Out of Food in the Last Year: Never true  Transportation Needs: No Transportation Needs (08/31/2022)   PRAPARE - Administrator, Civil Service (Medical): No    Lack of Transportation (Non-Medical): No  Physical Activity: Insufficiently Active (07/18/2023)   Exercise Vital Sign    Days of Exercise per Week: 1 day    Minutes of Exercise per Session: 30 min  Stress: No Stress Concern Present (07/18/2023)   [employer] of Occupational Health - Occupational Stress Questionnaire    Feeling of Stress : Not at all  Social Connections: Socially Isolated (07/18/2023)   Social Connection and Isolation Panel [NHANES]    Frequency of Communication with Friends and Family: Once a week    Frequency of Social Gatherings with Friends and Family: Once a week    Attends Religious Services: Never    Database administrator or Organizations: No    Attends Engineer, structural: Never    Marital Status: Married    Objective:  BP 110/80   Pulse 90   Temp (!) 97.5 F (36.4 C)   Resp 14   Ht 5\' 4"  (1.626 m)   Wt 132 lb (59.9 kg)   SpO2 97%   BMI 22.66 kg/m      01/30/2024    8:27 AM 12/26/2023    1:09 PM 12/19/2023    3:54 PM  BP/Weight  Systolic BP 110 120 110  Diastolic BP 80 72 68  Wt. (Lbs) 132 134 129.6  BMI 22.66 kg/m2 23 kg/m2 22.25 kg/m2    Physical Exam Vitals reviewed.  Constitutional:      Appearance: Normal appearance. She is normal weight.  Neck:     Vascular: No carotid bruit.  Cardiovascular:  Rate and Rhythm: Normal rate and regular rhythm.     Heart sounds: Normal heart sounds.   Pulmonary:     Effort: Pulmonary effort is normal. No respiratory distress.     Breath sounds: Normal breath sounds.  Abdominal:     General: Abdomen is flat. Bowel sounds are normal.     Palpations: Abdomen is soft.     Tenderness: There is no abdominal tenderness.  Musculoskeletal:     Left lower leg: Edema (trace) present.  Neurological:     Mental Status: She is alert and oriented to person, place, and time.  Psychiatric:        Mood and Affect: Mood normal.        Behavior: Behavior normal.     Diabetic Foot Exam - Simple   No data filed      Lab Results  Component Value Date   WBC 5.8 01/30/2024   HGB 14.4 01/30/2024   HCT 45.5 01/30/2024   PLT 283 01/30/2024   GLUCOSE 94 01/30/2024   CHOL 191 01/30/2024   TRIG 87 01/30/2024   HDL 70 01/30/2024   LDLCALC 105 (H) 01/30/2024   ALT 25 01/30/2024   AST 29 01/30/2024   NA 139 01/30/2024   K 4.7 01/30/2024   CL 102 01/30/2024   CREATININE 0.75 01/30/2024   BUN 12 01/30/2024   CO2 22 01/30/2024   TSH 3.150 07/18/2023   INR 1.0 03/02/2022      Assessment & Plan:  GERD without esophagitis Assessment & Plan: Controlled with omeprazole  40 mg daily   Mixed hyperlipidemia Assessment & Plan: Fairly well controlled Intolerant to medication in the past and recently restarted a low dose statin Taking Rosuvastatin  20 mg by mouth once daily.  Orders: -     Lipid panel -     CBC with Differential/Platelet -     Comprehensive metabolic panel with GFR  Multiple sclerosis (HCC) Assessment & Plan: Monitor. If worsens, will refer to neurology.    Vitamin D  deficiency Assessment & Plan: The current medical regimen is effective;  continue present plan and medications. Continue vitamin D  twice weekly.    Central sleep apnea syndrome Assessment & Plan: Benefits and is compliant and using CPAP.  On modafinil   Chronic midline low back pain without sciatica Assessment & Plan: Managed by pain clinic.   Percocet and Duragesic  patches as well as Flexeril as needed.   Centrilobular emphysema (HCC) Assessment & Plan: Has an albuterol  inhaler as needed.  Pt has stopped smoking.       No orders of the defined types were placed in this encounter.   Orders Placed This Encounter  Procedures   Lipid panel   CBC with Differential/Platelet   Comprehensive metabolic panel with GFR     Follow-up: Return in about 3 months (around 05/01/2024).   I,Marla I Leal-Borjas,acting as a scribe for Mercy Stall, MD.,have documented all relevant documentation on the behalf of Mercy Stall, MD,as directed by  Mercy Stall, MD while in the presence of Mercy Stall, MD.   An After Visit Summary was printed and given to the patient.  I attest that I have reviewed this visit and agree with the plan scribed by my staff.   Mercy Stall, MD [facility] [phone]

## 2024-01-30 ENCOUNTER — Encounter: Payer: Self-pay | Admitting: Family Medicine

## 2024-01-30 ENCOUNTER — Ambulatory Visit (INDEPENDENT_AMBULATORY_CARE_PROVIDER_SITE_OTHER): Payer: Medicare HMO | Admitting: Family Medicine

## 2024-01-30 VITALS — BP 110/80 | HR 90 | Temp 97.5°F | Resp 14 | Ht 64.0 in | Wt 132.0 lb

## 2024-01-30 DIAGNOSIS — J432 Centrilobular emphysema: Secondary | ICD-10-CM

## 2024-01-30 DIAGNOSIS — E559 Vitamin D deficiency, unspecified: Secondary | ICD-10-CM

## 2024-01-30 DIAGNOSIS — K219 Gastro-esophageal reflux disease without esophagitis: Secondary | ICD-10-CM | POA: Diagnosis not present

## 2024-01-30 DIAGNOSIS — M545 Low back pain, unspecified: Secondary | ICD-10-CM

## 2024-01-30 DIAGNOSIS — E782 Mixed hyperlipidemia: Secondary | ICD-10-CM

## 2024-01-30 DIAGNOSIS — J439 Emphysema, unspecified: Secondary | ICD-10-CM

## 2024-01-30 DIAGNOSIS — G8929 Other chronic pain: Secondary | ICD-10-CM | POA: Diagnosis not present

## 2024-01-30 DIAGNOSIS — G35 Multiple sclerosis: Secondary | ICD-10-CM | POA: Diagnosis not present

## 2024-01-30 DIAGNOSIS — G4731 Primary central sleep apnea: Secondary | ICD-10-CM

## 2024-01-30 NOTE — Assessment & Plan Note (Addendum)
 Fairly well controlled Intolerant to medication in the past and recently restarted a low dose statin Taking Rosuvastatin  20 mg by mouth once daily.

## 2024-01-30 NOTE — Assessment & Plan Note (Signed)
Controlled with omeprazole 40 mg daily.  

## 2024-01-31 ENCOUNTER — Ambulatory Visit: Payer: Self-pay | Admitting: Family Medicine

## 2024-01-31 LAB — CBC WITH DIFFERENTIAL/PLATELET
Basophils Absolute: 0 10*3/uL (ref 0.0–0.2)
Basos: 1 %
EOS (ABSOLUTE): 0.1 10*3/uL (ref 0.0–0.4)
Eos: 1 %
Hematocrit: 45.5 % (ref 34.0–46.6)
Hemoglobin: 14.4 g/dL (ref 11.1–15.9)
Immature Grans (Abs): 0 10*3/uL (ref 0.0–0.1)
Immature Granulocytes: 0 %
Lymphocytes Absolute: 1.4 10*3/uL (ref 0.7–3.1)
Lymphs: 24 %
MCH: 30.2 pg (ref 26.6–33.0)
MCHC: 31.6 g/dL (ref 31.5–35.7)
MCV: 95 fL (ref 79–97)
Monocytes Absolute: 0.3 10*3/uL (ref 0.1–0.9)
Monocytes: 6 %
Neutrophils Absolute: 4 10*3/uL (ref 1.4–7.0)
Neutrophils: 68 %
Platelets: 283 10*3/uL (ref 150–450)
RBC: 4.77 x10E6/uL (ref 3.77–5.28)
RDW: 12.4 % (ref 11.7–15.4)
WBC: 5.8 10*3/uL (ref 3.4–10.8)

## 2024-01-31 LAB — COMPREHENSIVE METABOLIC PANEL WITH GFR
ALT: 25 IU/L (ref 0–32)
AST: 29 IU/L (ref 0–40)
Albumin: 4.5 g/dL (ref 3.9–4.9)
Alkaline Phosphatase: 90 IU/L (ref 44–121)
BUN/Creatinine Ratio: 16 (ref 12–28)
BUN: 12 mg/dL (ref 8–27)
Bilirubin Total: 0.5 mg/dL (ref 0.0–1.2)
CO2: 22 mmol/L (ref 20–29)
Calcium: 9.8 mg/dL (ref 8.7–10.3)
Chloride: 102 mmol/L (ref 96–106)
Creatinine, Ser: 0.75 mg/dL (ref 0.57–1.00)
Globulin, Total: 1.8 g/dL (ref 1.5–4.5)
Glucose: 94 mg/dL (ref 70–99)
Potassium: 4.7 mmol/L (ref 3.5–5.2)
Sodium: 139 mmol/L (ref 134–144)
Total Protein: 6.3 g/dL (ref 6.0–8.5)
eGFR: 91 mL/min/{1.73_m2} (ref >59)

## 2024-01-31 LAB — LIPID PANEL
Chol/HDL Ratio: 2.7 ratio (ref 0.0–4.4)
Cholesterol, Total: 191 mg/dL (ref 100–199)
HDL: 70 mg/dL (ref >39)
LDL Chol Calc (NIH): 105 mg/dL — ABNORMAL HIGH (ref 0–99)
Triglycerides: 87 mg/dL (ref 0–149)
VLDL Cholesterol Cal: 16 mg/dL (ref 5–40)

## 2024-02-02 ENCOUNTER — Other Ambulatory Visit: Payer: Self-pay | Admitting: Family Medicine

## 2024-02-02 DIAGNOSIS — K551 Chronic vascular disorders of intestine: Secondary | ICD-10-CM

## 2024-02-02 NOTE — Assessment & Plan Note (Addendum)
 Has an albuterol  inhaler as needed.  Pt has stopped smoking.

## 2024-02-02 NOTE — Assessment & Plan Note (Addendum)
 Benefits and is compliant and using CPAP.  On modafinil

## 2024-02-02 NOTE — Assessment & Plan Note (Signed)
 Managed by pain clinic.  Percocet and Duragesic  patches as well as Flexeril as needed.

## 2024-02-02 NOTE — Assessment & Plan Note (Signed)
 Monitor. If worsens, will refer to neurology.

## 2024-02-02 NOTE — Assessment & Plan Note (Signed)
 The current medical regimen is effective;  continue present plan and medications. Continue vitamin D  twice weekly.

## 2024-02-06 DIAGNOSIS — M5134 Other intervertebral disc degeneration, thoracic region: Secondary | ICD-10-CM | POA: Diagnosis not present

## 2024-02-06 DIAGNOSIS — M51362 Other intervertebral disc degeneration, lumbar region with discogenic back pain and lower extremity pain: Secondary | ICD-10-CM | POA: Diagnosis not present

## 2024-02-06 DIAGNOSIS — M542 Cervicalgia: Secondary | ICD-10-CM | POA: Diagnosis not present

## 2024-02-06 DIAGNOSIS — M419 Scoliosis, unspecified: Secondary | ICD-10-CM | POA: Diagnosis not present

## 2024-02-06 DIAGNOSIS — M546 Pain in thoracic spine: Secondary | ICD-10-CM | POA: Diagnosis not present

## 2024-02-06 DIAGNOSIS — G8929 Other chronic pain: Secondary | ICD-10-CM | POA: Diagnosis not present

## 2024-02-19 ENCOUNTER — Ambulatory Visit (INDEPENDENT_AMBULATORY_CARE_PROVIDER_SITE_OTHER)

## 2024-02-19 DIAGNOSIS — E538 Deficiency of other specified B group vitamins: Secondary | ICD-10-CM | POA: Diagnosis not present

## 2024-02-19 DIAGNOSIS — M7989 Other specified soft tissue disorders: Secondary | ICD-10-CM | POA: Diagnosis not present

## 2024-02-19 MED ORDER — CYANOCOBALAMIN 1000 MCG/ML IJ SOLN
1000.0000 ug | Freq: Once | INTRAMUSCULAR | Status: AC
Start: 2024-02-19 — End: 2024-02-19
  Administered 2024-02-19: 1000 ug via INTRAMUSCULAR

## 2024-02-19 NOTE — Progress Notes (Signed)
 Patient is in office today for a nurse visit for B12 Injection. Patient Injection was given in the  Right arm. Patient tolerated injection well.

## 2024-03-03 ENCOUNTER — Telehealth: Payer: Self-pay

## 2024-03-03 NOTE — Telephone Encounter (Signed)
 agree

## 2024-03-03 NOTE — Telephone Encounter (Signed)
 Patient states that she started symptoms 3 days ago and she was positive for covid yesterday. She is feeling better now. She will take mucinex, for sinus congestion. Patient is scared to take paxlovid because she is taking rosuvastatin  and plavix . So she prefers do not take it for possible interaction.   Dr Sherre recommended take paxlovid due her health condition, however she said monitor her symptoms, drink plenty fluids, take mucinex, cough syrup and uses her inhaler if she feels like she needs it. She will call back if she does not feel better tomorrow and she will make a video visit if she needs it.  Copied from CRM [id number]. Topic: Clinical - Medical Advice >> Mar 03, 2024  9:45 AM [name] wrote: Reason for CRM: Pt tested positive for covid- would like to know what does she do from here, pt number [phone]

## 2024-03-05 ENCOUNTER — Telehealth: Payer: Self-pay

## 2024-03-05 NOTE — Telephone Encounter (Signed)
 Copied from CRM [id number]. Topic: General - Other >> Mar 05, 2024  9:22 AM Wess RAMAN wrote: Reason for CRM: Patient is requesting a callback from Dr. Sherre to discuss covid medication  Callback #: [phone]

## 2024-03-05 NOTE — Telephone Encounter (Signed)
 Patient called to question if she needed paxlovid despite her fear of taking it with her know medical condition. Patient stated she felt better yesterday and continuing to feel better today. Did complain of mild sinus congestion stating it is a little greenish in color. Informed patient that per Dr. Sherre recommendation on 06/30, Dr Sherre recommended take paxlovid due her health condition, however she said monitor her symptoms, drink plenty fluids, take mucinex, cough syrup and uses her inhaler if she feels like she needs it.   Advised patient since her symptoms have continued to improve over the past two days, with today being day 5 unless she absolutely wanted to take paxlovid for her to continue to push fluids, take mucinex and use inhalers if needed. If she continues to have sinus symptoms or they worsen to call our office to schedule an appointment.  Patient agreed and verbalized understanding.   Copied from CRM [id number]. Topic: General - Other >> Mar 05, 2024  9:22 AM Wess RAMAN wrote: Reason for CRM: Patient is requesting a callback from Dr. Sherre to discuss covid medication  Callback #: [phone]

## 2024-03-18 ENCOUNTER — Other Ambulatory Visit (HOSPITAL_COMMUNITY): Payer: Self-pay | Admitting: Neurosurgery

## 2024-03-18 ENCOUNTER — Encounter (HOSPITAL_COMMUNITY): Payer: Self-pay | Admitting: Neurosurgery

## 2024-03-18 ENCOUNTER — Ambulatory Visit (HOSPITAL_BASED_OUTPATIENT_CLINIC_OR_DEPARTMENT_OTHER)
Admission: RE | Admit: 2024-03-18 | Discharge: 2024-03-18 | Disposition: A | Discharge status: Home / Self Care | Source: Ambulatory Visit | Attending: Vascular Surgery | Admitting: Vascular Surgery

## 2024-03-18 ENCOUNTER — Ambulatory Visit (HOSPITAL_COMMUNITY)
Admission: RE | Admit: 2024-03-18 | Discharge: 2024-03-18 | Disposition: A | Discharge status: Home / Self Care | Source: Ambulatory Visit | Attending: Vascular Surgery | Admitting: Vascular Surgery

## 2024-03-18 DIAGNOSIS — I999 Unspecified disorder of circulatory system: Secondary | ICD-10-CM | POA: Insufficient documentation

## 2024-03-18 LAB — VAS US ABI WITH/WO TBI
Left ABI: 0.95
Right ABI: 1.01

## 2024-04-01 DIAGNOSIS — M542 Cervicalgia: Secondary | ICD-10-CM | POA: Diagnosis not present

## 2024-04-01 DIAGNOSIS — F1120 Opioid dependence, uncomplicated: Secondary | ICD-10-CM | POA: Diagnosis not present

## 2024-04-22 DIAGNOSIS — M5440 Lumbago with sciatica, unspecified side: Secondary | ICD-10-CM | POA: Diagnosis not present

## 2024-04-22 DIAGNOSIS — M542 Cervicalgia: Secondary | ICD-10-CM | POA: Diagnosis not present

## 2024-04-22 DIAGNOSIS — I999 Unspecified disorder of circulatory system: Secondary | ICD-10-CM | POA: Diagnosis not present

## 2024-04-22 DIAGNOSIS — G35 Multiple sclerosis: Secondary | ICD-10-CM | POA: Diagnosis not present

## 2024-04-23 ENCOUNTER — Ambulatory Visit
Admission: RE | Admit: 2024-04-23 | Discharge: 2024-04-23 | Disposition: A | Discharge status: Home / Self Care | Payer: Medicare HMO | Source: Ambulatory Visit | Attending: Surgery | Admitting: Surgery

## 2024-04-23 DIAGNOSIS — Z1231 Encounter for screening mammogram for malignant neoplasm of breast: Secondary | ICD-10-CM | POA: Diagnosis not present

## 2024-04-25 ENCOUNTER — Other Ambulatory Visit: Payer: Self-pay | Admitting: Surgery

## 2024-04-25 DIAGNOSIS — Z1231 Encounter for screening mammogram for malignant neoplasm of breast: Secondary | ICD-10-CM

## 2024-04-28 ENCOUNTER — Other Ambulatory Visit: Payer: Self-pay | Admitting: Surgery

## 2024-04-28 DIAGNOSIS — Z1231 Encounter for screening mammogram for malignant neoplasm of breast: Secondary | ICD-10-CM

## 2024-05-02 DIAGNOSIS — N6012 Diffuse cystic mastopathy of left breast: Secondary | ICD-10-CM | POA: Diagnosis not present

## 2024-05-02 DIAGNOSIS — N6011 Diffuse cystic mastopathy of right breast: Secondary | ICD-10-CM | POA: Diagnosis not present

## 2024-05-06 ENCOUNTER — Encounter: Payer: Self-pay | Admitting: Family Medicine

## 2024-05-06 ENCOUNTER — Ambulatory Visit (INDEPENDENT_AMBULATORY_CARE_PROVIDER_SITE_OTHER): Admitting: Family Medicine

## 2024-05-06 VITALS — BP 130/72 | HR 98 | Temp 98.1°F | Ht 64.0 in | Wt 126.5 lb

## 2024-05-06 DIAGNOSIS — H6502 Acute serous otitis media, left ear: Secondary | ICD-10-CM | POA: Diagnosis not present

## 2024-05-06 DIAGNOSIS — Z87891 Personal history of nicotine dependence: Secondary | ICD-10-CM | POA: Diagnosis not present

## 2024-05-06 DIAGNOSIS — Z23 Encounter for immunization: Secondary | ICD-10-CM

## 2024-05-06 DIAGNOSIS — J301 Allergic rhinitis due to pollen: Secondary | ICD-10-CM

## 2024-05-06 DIAGNOSIS — G8929 Other chronic pain: Secondary | ICD-10-CM

## 2024-05-06 DIAGNOSIS — R600 Localized edema: Secondary | ICD-10-CM | POA: Diagnosis not present

## 2024-05-06 DIAGNOSIS — E538 Deficiency of other specified B group vitamins: Secondary | ICD-10-CM

## 2024-05-06 DIAGNOSIS — E559 Vitamin D deficiency, unspecified: Secondary | ICD-10-CM

## 2024-05-06 DIAGNOSIS — E782 Mixed hyperlipidemia: Secondary | ICD-10-CM | POA: Diagnosis not present

## 2024-05-06 DIAGNOSIS — K219 Gastro-esophageal reflux disease without esophagitis: Secondary | ICD-10-CM

## 2024-05-06 DIAGNOSIS — M545 Low back pain, unspecified: Secondary | ICD-10-CM

## 2024-05-06 DIAGNOSIS — I209 Angina pectoris, unspecified: Secondary | ICD-10-CM

## 2024-05-06 DIAGNOSIS — R918 Other nonspecific abnormal finding of lung field: Secondary | ICD-10-CM

## 2024-05-06 MED ORDER — AMOXICILLIN 875 MG PO TABS
875.0000 mg | ORAL_TABLET | Freq: Two times a day (BID) | ORAL | 0 refills | Status: AC
Start: 2024-05-06 — End: 2024-05-16

## 2024-05-06 MED ORDER — FLUTICASONE PROPIONATE 50 MCG/ACT NA SUSP: 2.0000 | Freq: Every day | NASAL | 3 refills | Status: AC

## 2024-05-06 NOTE — Progress Notes (Signed)
 "  Subjective:  Patient ID: Christina Cantrell, female    DOB: 1961-04-14  Age: 63 y.o. MRN: [id number]  Chief Complaint  Patient presents with   Medical Management of Chronic Issues    HPI: Discussed the use of AI scribe software for clinical note transcription with the patient, who gave verbal consent to proceed.  History of Present Illness Christina Cantrell is a 63 year old female who presents with ear pain and a request for a B12 injection.  Otalgia and nasal congestion - Bilateral ear pain with sensation of fullness, attributed to fluid in the ears - Occasional use of Flonase  at night - No current use of other allergy medications - Stuffy nose present - No fever, chills, sweats, sore throat, or rhinorrhea  Angina - Intermittent chest pain occurring 1-3 times per month - Carries nitroglycerin  but avoids use due to headache side effects, although it does help when taken.  - Chest pain typically resolves spontaneously within a few minutes without medication - Sees cardiology.  - On aspirin, Plavix , Crestor ,  Chronic back and leg pain - History of bulging discs and prior cervical spinal fusion - Chronic pain in back and legs, predominantly left leg - Left leg swelling present - Ongoing pain management with Percocet, Flexeril, and fentanyl  patch - History of MRIs and physical therapy for pain management  Pulmonary nodules and smoking history - Former smoker - Undergoes annual lung cancer screening - Stable pulmonary nodules on prior imaging - Next CT scan due after November 18th  B12 deficiency and vitamin d  deficiency.  - Receives B12 injections - Takes vitamin D  twice weekly  Remote trauma - History of fall in November 2019 resulting in patellar fracture and nasal trauma from impact with wall       07/18/2023    9:03 AM 04/05/2023   10:27 AM 12/18/2022   11:26 AM 10/04/2021   10:39 AM 09/16/2020    9:54 AM  Depression screen PHQ 2/9  Decreased Interest 0 0 0 0 2   Down, Depressed, Hopeless 0 0 0 0 2  PHQ - 2 Score 0 0 0 0 4  Altered sleeping 3 1  0 3  Tired, decreased energy 3 3  0 3  Change in appetite 0 0  1   Feeling bad or failure about yourself  0 0  0 1  Trouble concentrating 0 0  0 0  Moving slowly or fidgety/restless 0 0  0 0  Suicidal thoughts 0 0  0 0  PHQ-9 Score 6 4  1 11   Difficult doing work/chores Somewhat difficult Not difficult at all  Not difficult at all         04/19/2023    2:11 PM  Fall Risk   Falls in the past year? 0  Number falls in past yr: 0  Injury with Fall? 0  Risk for fall due to : History of fall(s)  Follow up Falls evaluation completed;Education provided    Patient Care Team: Sherre Clapper, MD as PCP - General (Family Medicine) Monetta Redell PARAS, MD as Consulting Physician (Cardiology) Covington Ferebee Desai, Sarah, PA-C as Physician Assistant (Neurosurgery) Misenheimer, Evalene, MD as Consulting Physician (Unknown Physician Specialty) Arloa Brunner, OD (Optometry) Kendell Marcey ORN, MD as Referring Physician (Family Medicine)   Review of Systems  Constitutional:  Negative for chills, fatigue and fever.  HENT:  Positive for ear pain. Negative for congestion and sore throat.   Respiratory:  Negative for cough and shortness  of breath.   Cardiovascular:  Positive for chest pain.  Gastrointestinal:  Negative for abdominal pain, constipation, diarrhea, nausea and vomiting.  Genitourinary:  Negative for dysuria and urgency.  Musculoskeletal:  Positive for back pain. Negative for arthralgias and myalgias.  Skin:  Negative for rash.  Neurological:  Negative for dizziness and headaches.  Psychiatric/Behavioral:  Negative for dysphoric mood. The patient is not nervous/anxious.     Current Outpatient Medications on File Prior to Visit  Medication Sig Dispense Refill   albuterol  (VENTOLIN  HFA) 108 (90 Base) MCG/ACT inhaler Inhale 2 puffs into the lungs every 6 (six) hours as needed for wheezing or shortness of  breath. 18 g 3   aspirin EC 81 MG tablet Take 81 mg by mouth daily. Swallow whole.     clopidogrel  (PLAVIX ) 75 MG tablet TAKE 1 TABLET BY MOUTH EVERY DAY 90 tablet 1   cyanocobalamin  (VITAMIN B12) 1000 MCG/ML injection INJECT 1 ML (1,000 MCG) INTRAMUSCULARLY EVERY 30 DAYS 3 mL 1   cyclobenzaprine (FLEXERIL) 10 MG tablet Take 10 mg by mouth 3 (three) times daily as needed for muscle spasms.     doxycycline  (VIBRAMYCIN ) 50 MG capsule Take 50 mg by mouth 2 (two) times daily.     fentaNYL  (DURAGESIC ) 75 MCG/HR Place 1 patch onto the skin 2 days.     metroNIDAZOLE (METROCREAM) 0.75 % cream Apply 1 Application topically 2 (two) times daily.     nitroGLYCERIN  (NITROSTAT ) 0.4 MG SL tablet Place 0.4 mg under the tongue every 5 (five) minutes as needed for chest pain.     omeprazole  (PRILOSEC) 40 MG capsule TAKE 1 CAPSULE (40 MG TOTAL) BY MOUTH DAILY. 90 capsule 1   oxyCODONE-acetaminophen (PERCOCET) 10-325 MG tablet Take 1 tablet by mouth every 4 (four) hours as needed for pain.     Polyethyl Glycol-Propyl Glycol (SYSTANE OP) Place 1 drop into both eyes daily as needed (dry eyes).     rosuvastatin  (CRESTOR ) 20 MG tablet Take 1 tablet (20 mg total) by mouth daily. 90 tablet 3   Vitamin D , Ergocalciferol , (DRISDOL ) 1.25 MG (50000 UNIT) CAPS capsule TAKE 1 CAPSULE (50,000 UNITS TOTAL) BY MOUTH TWO TIMES A WEEK 24 capsule 1   No current facility-administered medications on file prior to visit.   Past Medical History:  Diagnosis Date   Angina pectoris (HCC) 03/16/2022   B12 deficiency 09/13/2022   Central sleep apnea comorbid with prescribed opioid use, moderate 10/30/2022   Central sleep apnea syndrome 05/30/2018   Centrilobular emphysema (HCC) 10/04/2021   Cervical radiculopathy 07/21/2021   Chronic low back pain 06/04/2013   Degeneration of lumbar intervertebral disc 06/04/2013   Epigastric pain 10/04/2021   Esophageal dysphagia 02/13/2022   Ex-smoker 03/16/2022   Familial hypercholesterolemia  12/22/2022   Familial hyperlipidemia 06/27/2021   GERD without esophagitis 09/13/2022   Localized osteoporosis without current pathological fracture 06/27/2021   Mixed hyperlipidemia 06/27/2021   Multiple sclerosis (HCC) 1991   Age 24 yo   Need for influenza vaccination 05/28/2022   Neuropathic pain 11/12/2017   Optic neuritis 09/17/2018   Osteoarthritis 09/17/2018   Osteopenia 11/20/2018   Other chest pain 02/18/2022   Pain in thoracic spine 06/04/2013   Pain of upper abdomen 10/04/2021   Paresthesia 02/13/2022   Polyuria 02/18/2022   Screening for lung cancer 12/29/2021   Spinal stenosis 09/17/2018   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   Therapeutic opioid induced constipation 02/18/2022   Therapeutic opioid-induced constipation (OIC) 02/18/2022  Uncomplicated opioid dependence (HCC) 09/12/2021   Vitamin B12 deficiency 05/28/2022   Vitamin D  deficiency 06/27/2021   Past Surgical History:  Procedure Laterality Date   BREAST BIOPSY Bilateral 08/24/2020   BREAST EXCISIONAL BIOPSY Right 1982   IR ANGIOGRAM VISCERAL SELECTIVE  03/02/2022   IR RADIOLOGIST EVAL & MGMT  02/21/2022   IR RADIOLOGIST EVAL & MGMT  04/03/2022   IR RADIOLOGIST EVAL & MGMT  06/12/2022   IR RADIOLOGIST EVAL & MGMT  05/09/2023   IR RADIOLOGIST EVAL & MGMT  09/27/2023   IR TRANSCATH PLC STENT 1ST ART NOT LE CV CAR VERT CAR  03/02/2022   IR US  GUIDE VASC ACCESS RIGHT  03/02/2022    Family History  Family history unknown: Yes   Social History   Socioeconomic History   Marital status: Married    Spouse name: Lamar   Number of children: 1   Years of education: Not on file   Highest education level: Associate degree: occupational, scientist, product/process development, or vocational program  Occupational History   Not on file  Tobacco Use   Smoking status: Former    Current packs/day: 0.00    Types: Cigarettes    Start date: 1998    Quit date: 2018    Years since quitting:  7.6   Smokeless tobacco: Never  Vaping Use   Vaping status: Never Used  Substance and Sexual Activity   Alcohol use: Yes    Alcohol/week: 1.0 standard drink of alcohol    Types: 1 Glasses of wine per week    Comment: socially   Drug use: Never   Sexual activity: Not Currently    Partners: Male  Other Topics Concern   Not on file  Social History Narrative   Not on file   Social Drivers of Health   Financial Resource Strain: Low Risk  (07/18/2023)   Overall Financial Resource Strain (CARDIA)    Difficulty of Paying Living Expenses: Not very hard  Food Insecurity: No Food Insecurity (05/06/2024)   Hunger Vital Sign    Worried About Running Out of Food in the Last Year: Never true    Ran Out of Food in the Last Year: Never true  Transportation Needs: No Transportation Needs (05/06/2024)   PRAPARE - Administrator, Civil Service (Medical): No    Lack of Transportation (Non-Medical): No  Physical Activity: Insufficiently Active (07/18/2023)   Exercise Vital Sign    Days of Exercise per Week: 1 day    Minutes of Exercise per Session: 30 min  Stress: No Stress Concern Present (07/18/2023)   [employer] of Occupational Health - Occupational Stress Questionnaire    Feeling of Stress : Not at all  Social Connections: Socially Isolated (07/18/2023)   Social Connection and Isolation Panel    Frequency of Communication with Friends and Family: Once a week    Frequency of Social Gatherings with Friends and Family: Once a week    Attends Religious Services: Never    Database Administrator or Organizations: No    Attends Engineer, Structural: Never    Marital Status: Married    Objective:  BP 130/72   Pulse 98   Temp 98.1 F (36.7 C)   Ht 5' 4 (1.626 m)   Wt 126 lb 8 oz (57.4 kg)   SpO2 95%   BMI 21.71 kg/m      05/06/2024    2:14 PM 01/30/2024    8:27 AM 12/26/2023    1:09 PM  BP/Weight  Systolic BP 130 110 120  Diastolic BP 72 80 72  Wt. (Lbs)  126.5 132 134  BMI 21.71 kg/m2 22.66 kg/m2 23 kg/m2    Physical Exam Vitals reviewed.  Constitutional:      Appearance: Normal appearance.  HENT:     Right Ear: Tympanic membrane, ear canal and external ear normal.     Left Ear: Ear canal and external ear normal. A middle ear effusion is present.     Nose: Nose normal.     Right Turbinates: Enlarged, swollen and pale.     Left Turbinates: Enlarged, swollen and pale.     Mouth/Throat:     Pharynx: Oropharynx is clear.  Cardiovascular:     Rate and Rhythm: Normal rate and regular rhythm.     Heart sounds: Normal heart sounds. No murmur heard. Pulmonary:     Effort: Pulmonary effort is normal. No respiratory distress.     Breath sounds: Normal breath sounds.  Lymphadenopathy:     Cervical: No cervical adenopathy.  Neurological:     Mental Status: She is alert and oriented to person, place, and time.  Psychiatric:        Mood and Affect: Mood normal.        Behavior: Behavior normal.         Lab Results  Component Value Date   WBC 4.9 05/06/2024   HGB 14.2 05/06/2024   HCT 44.4 05/06/2024   PLT 303 05/06/2024   GLUCOSE 97 05/06/2024   CHOL 173 05/06/2024   TRIG 72 05/06/2024   HDL 72 05/06/2024   LDLCALC 87 05/06/2024   ALT 12 05/06/2024   AST 17 05/06/2024   NA 140 05/06/2024   K 5.0 05/06/2024   CL 101 05/06/2024   CREATININE 0.91 05/06/2024   BUN 13 05/06/2024   CO2 25 05/06/2024   TSH 3.150 07/18/2023   INR 1.0 03/02/2022      Assessment & Plan:  Mixed hyperlipidemia Assessment & Plan: Managed with Crestor . Discussion of new cholesterol-lowering treatment options, including Leqvio, with cardiologist. - Discuss new cholesterol-lowering treatment options with cardiologist.  Orders: -     Comprehensive metabolic panel with GFR -     CBC with Differential/Platelet -     Lipid panel  Seasonal allergic rhinitis due to pollen Assessment & Plan: Nasal congestion likely due to pollen allergies. -  Prescribe Flonase  for nasal congestion.  Orders: -     Fluticasone  Propionate; Place 2 sprays into both nostrils daily.  Dispense: 48 g; Refill: 3  Non-recurrent acute serous otitis media of left ear Assessment & Plan: Fluid in the left ear causing discomfort, likely due to seasonal factors. - Prescribe amoxicillin  for ear infection. - Prescribe Flonase  for nasal congestion.  Orders: -     Amoxicillin ; Take 1 tablet (875 mg total) by mouth 2 (two) times daily for 10 days.  Dispense: 20 tablet; Refill: 0 -     Fluticasone  Propionate; Place 2 sprays into both nostrils daily.  Dispense: 48 g; Refill: 3  History of tobacco abuse -     CT CHEST LUNG CANCER SCREENING LOW DOSE WO CONTRAST  Angina pectoris (HCC) Assessment & Plan: Intermittent chest pain occurring one to three times a month. Emphasized importance of nitroglycerin  use to prevent myocardial infarction. - Advise use of nitroglycerin  during angina episodes. - Educate on the importance of using nitroglycerin  to prevent myocardial infarction.   Encounter for immunization -     Flu vaccine, recombinant, trivalent,  inj  Chronic midline low back pain without sciatica Assessment & Plan: Chronic pain due to bulging discs and prior spinal fusion. Not a surgical candidate. Pain management includes Percocet, Flexeril, and fentanyl  patch. - Continue current pain management regimen. - Start physical therapy for mobility improvement.   Edema leg Assessment & Plan: Chronic swelling in the left leg. Undergoing evaluation and treatment with vascular specialists. - Continue with vascular specialist evaluation. - Start physical therapy focusing on the left leg.   GERD without esophagitis Assessment & Plan: Managed with omeprazole . - Continue omeprazole .   Vitamin B12 deficiency Assessment & Plan: Managed with regular B12 injections. - Continue B12 injections.   Vitamin D  deficiency Assessment & Plan: Managed with  twice-weekly supplementation. - Continue vitamin D  supplementation.      Meds ordered this encounter  Medications   amoxicillin  (AMOXIL ) 875 MG tablet    Sig: Take 1 tablet (875 mg total) by mouth 2 (two) times daily for 10 days.    Dispense:  20 tablet    Refill:  0   fluticasone  (FLONASE ) 50 MCG/ACT nasal spray    Sig: Place 2 sprays into both nostrils daily.    Dispense:  48 g    Refill:  3    Orders Placed This Encounter  Procedures   CT CHEST LUNG CANCER SCREENING LOW DOSE WO CONTRAST   Flu vaccine, recombinant, trivalent, inj   Comprehensive metabolic panel with GFR   CBC with Differential/Platelet   Lipid panel     Follow-up: Return in about 3 months (around 08/05/2024) for chronic follow up.  I,Marla I Leal-Borjas,acting as a scribe for Abigail Free, MD.,have documented all relevant documentation on the behalf of Abigail Free, MD,as directed by  Abigail Free, MD while in the presence of Abigail Free, MD.    An After Visit Summary was printed and given to the patient.  I attest that I have reviewed this visit and agree with the plan scribed by my staff.   Abigail Free, MD [facility] [phone]    "

## 2024-05-06 NOTE — Patient Instructions (Signed)
  VISIT SUMMARY: During your visit, we addressed your ear pain, nasal congestion, chest pain, chronic back and leg pain, and other ongoing health issues. We also discussed your medication and supplementation regimen.  YOUR PLAN: ACUTE SEROUS OTITIS MEDIA, LEFT EAR: Fluid in your left ear is causing discomfort, likely due to seasonal factors. -We prescribed amoxicillin  for the ear infection. -We also prescribed Flonase  for nasal congestion.  ALLERGIC RHINITIS DUE TO POLLEN: Your nasal congestion is likely due to pollen allergies. -We prescribed Flonase  for nasal congestion.  ANGINA PECTORIS: You have intermittent chest pain occurring one to three times a month. It's important to use nitroglycerin  to prevent a heart attack. -Use nitroglycerin  during angina episodes. -It's important to use nitroglycerin  to prevent a heart attack.  STABLE PULMONARY NODULES: You have stable pulmonary nodules from previous lung cancer screenings. Annual screenings are recommended due to your smoking history. -Schedule your lung cancer screening after November 18th.  CHRONIC PAIN DUE TO SPINAL DISEASE AND PRIOR SPINAL FUSION: You have chronic pain due to bulging discs and a prior spinal fusion. You are not a candidate for surgery. -Continue your current pain management regimen with Percocet, Flexeril, and fentanyl  patch. -Start physical therapy to improve mobility.  CHRONIC LEFT LOWER EXTREMITY EDEMA: You have chronic swelling in your left leg and are undergoing evaluation and treatment with vascular specialists. -Continue with the evaluation by the vascular specialist. -Start physical therapy focusing on your left leg.  MIXED HYPERLIPIDEMIA: Your cholesterol levels are managed with Crestor . We discussed new cholesterol-lowering treatment options with your cardiologist. -Discuss new cholesterol-lowering treatment options with your cardiologist.  GASTROESOPHAGEAL REFLUX DISEASE: Your acid reflux is managed with  omeprazole . -Continue taking omeprazole .  VITAMIN B12 DEFICIENCY, ON REPLACEMENT THERAPY: You are managing your B12 deficiency with regular injections. -Continue your B12 injections.  VITAMIN D  DEFICIENCY, ON REPLACEMENT THERAPY: You are managing your vitamin D  deficiency with twice-weekly supplementation. -Continue your vitamin D  supplementation.                      Contains text generated by [employer]bridge.                                 Contains text generated by [employer]bridge.

## 2024-05-07 ENCOUNTER — Ambulatory Visit: Payer: Self-pay | Admitting: Family Medicine

## 2024-05-07 LAB — CBC WITH DIFFERENTIAL/PLATELET
Basophils Absolute: 0 x10E3/uL (ref 0.0–0.2)
Basos: 1 %
EOS (ABSOLUTE): 0.1 x10E3/uL (ref 0.0–0.4)
Eos: 2 %
Hematocrit: 44.4 % (ref 34.0–46.6)
Hemoglobin: 14.2 g/dL (ref 11.1–15.9)
Immature Grans (Abs): 0 x10E3/uL (ref 0.0–0.1)
Immature Granulocytes: 0 %
Lymphocytes Absolute: 2.3 x10E3/uL (ref 0.7–3.1)
Lymphs: 48 %
MCH: 30 pg (ref 26.6–33.0)
MCHC: 32 g/dL (ref 31.5–35.7)
MCV: 94 fL (ref 79–97)
Monocytes Absolute: 0.4 x10E3/uL (ref 0.1–0.9)
Monocytes: 8 %
Neutrophils Absolute: 2 x10E3/uL (ref 1.4–7.0)
Neutrophils: 41 %
Platelets: 303 x10E3/uL (ref 150–450)
RBC: 4.73 x10E6/uL (ref 3.77–5.28)
RDW: 12.5 % (ref 11.7–15.4)
WBC: 4.9 x10E3/uL (ref 3.4–10.8)

## 2024-05-07 LAB — LIPID PANEL
Chol/HDL Ratio: 2.4 ratio (ref 0.0–4.4)
Cholesterol, Total: 173 mg/dL (ref 100–199)
HDL: 72 mg/dL (ref >39)
LDL Chol Calc (NIH): 87 mg/dL (ref 0–99)
Triglycerides: 72 mg/dL (ref 0–149)
VLDL Cholesterol Cal: 14 mg/dL (ref 5–40)

## 2024-05-07 LAB — COMPREHENSIVE METABOLIC PANEL WITH GFR
ALT: 12 IU/L (ref 0–32)
AST: 17 IU/L (ref 0–40)
Albumin: 4.5 g/dL (ref 3.9–4.9)
Alkaline Phosphatase: 88 IU/L (ref 44–121)
BUN/Creatinine Ratio: 14 (ref 12–28)
BUN: 13 mg/dL (ref 8–27)
Bilirubin Total: 0.5 mg/dL (ref 0.0–1.2)
CO2: 25 mmol/L (ref 20–29)
Calcium: 9.9 mg/dL (ref 8.7–10.3)
Chloride: 101 mmol/L (ref 96–106)
Creatinine, Ser: 0.91 mg/dL (ref 0.57–1.00)
Globulin, Total: 2.1 g/dL (ref 1.5–4.5)
Glucose: 97 mg/dL (ref 70–99)
Potassium: 5 mmol/L (ref 3.5–5.2)
Sodium: 140 mmol/L (ref 134–144)
Total Protein: 6.6 g/dL (ref 6.0–8.5)
eGFR: 72 mL/min/1.73 (ref >59)

## 2024-05-10 DIAGNOSIS — H6502 Acute serous otitis media, left ear: Secondary | ICD-10-CM | POA: Insufficient documentation

## 2024-05-10 DIAGNOSIS — J301 Allergic rhinitis due to pollen: Secondary | ICD-10-CM | POA: Insufficient documentation

## 2024-05-10 DIAGNOSIS — R918 Other nonspecific abnormal finding of lung field: Secondary | ICD-10-CM | POA: Insufficient documentation

## 2024-05-10 NOTE — Assessment & Plan Note (Signed)
 Fluid in the left ear causing discomfort, likely due to seasonal factors. - Prescribe amoxicillin  for ear infection. - Prescribe Flonase  for nasal congestion.

## 2024-05-10 NOTE — Assessment & Plan Note (Signed)
 Chronic swelling in the left leg. Undergoing evaluation and treatment with vascular specialists. - Continue with vascular specialist evaluation. - Start physical therapy focusing on the left leg.

## 2024-05-10 NOTE — Assessment & Plan Note (Signed)
 Managed with regular B12 injections. - Continue B12 injections.

## 2024-05-10 NOTE — Assessment & Plan Note (Signed)
 Nasal congestion likely due to pollen allergies. - Prescribe Flonase  for nasal congestion.

## 2024-05-10 NOTE — Assessment & Plan Note (Signed)
 Managed with omeprazole . - Continue omeprazole .

## 2024-05-10 NOTE — Assessment & Plan Note (Signed)
 Chronic pain due to bulging discs and prior spinal fusion. Not a surgical candidate. Pain management includes Percocet, Flexeril, and fentanyl  patch. - Continue current pain management regimen. - Start physical therapy for mobility improvement.

## 2024-05-10 NOTE — Assessment & Plan Note (Signed)
 Managed with Crestor . Discussion of new cholesterol-lowering treatment options, including Leqvio, with cardiologist. - Discuss new cholesterol-lowering treatment options with cardiologist.

## 2024-05-10 NOTE — Assessment & Plan Note (Signed)
 Managed with twice-weekly supplementation. - Continue vitamin D  supplementation.

## 2024-05-10 NOTE — Assessment & Plan Note (Signed)
 Stable pulmonary nodules on previous lung cancer screening. Annual screenings recommended due to smoking history. - Schedule lung cancer screening after November 18th.

## 2024-05-10 NOTE — Assessment & Plan Note (Signed)
 Intermittent chest pain occurring one to three times a month. Emphasized importance of nitroglycerin  use to prevent myocardial infarction. - Advise use of nitroglycerin  during angina episodes. - Educate on the importance of using nitroglycerin  to prevent myocardial infarction.

## 2024-05-11 DIAGNOSIS — Z87891 Personal history of nicotine dependence: Secondary | ICD-10-CM | POA: Insufficient documentation

## 2024-05-13 ENCOUNTER — Other Ambulatory Visit: Payer: Self-pay

## 2024-05-13 ENCOUNTER — Ambulatory Visit: Payer: Self-pay

## 2024-05-13 MED ORDER — AZITHROMYCIN 250 MG PO TABS: ORAL_TABLET | ORAL | 0 refills | Status: AC

## 2024-05-13 NOTE — Telephone Encounter (Signed)
 Pt stated she is no longer going to take the ABX: will wait for PCP to determine plan of care moving forward.  Pt would like different ABX   FYI Only or Action Required?: Action required by provider: clinical question for provider and update on patient condition.  Patient was last seen in primary care on 05/06/2024 by Sherre Clapper, MD.  Called Nurse Triage reporting Rash.  Symptoms began x 2 days.  Interventions attempted: Other: pt stopped ABX & s/s decreased & gone at 4 hours: pt restarted ABX & s/s returned..  Symptoms are: gradually worsening.  Triage Disposition: No disposition on file.  Patient/caregiver understands and will follow disposition?:     Copied from CRM #[id number]. Topic: Clinical - Red Word Triage >> May 13, 2024  9:39 AM Montie POUR wrote: Red Word that prompted transfer to Nurse Triage:  She is having an allergic reaction to amoxicillin  (AMOXIL ) 875 MG tablet. Her skin is bright red and burning. More on her face. Reason for Disposition  [1] Localized rash is very painful AND [2] no fever  Answer Assessment - Initial Assessment Questions 1. APPEARANCE of RASH: What does the rash look like? (e.g., blisters, dry flaky skin, red spots, redness, sores) bright Red, burning 2. LOCATION: Where is the rash located?      face 3. NUMBER: How many spots are there?      na 4. SIZE: How big are the spots? (e.g., inches, cm; or compare to size of pinhead, tip of pen, eraser, pea)      na 5. ONSET: When did the rash start?      X 2 days 6. ITCHING: Does the rash itch? If Yes, ask: How bad is the itch?  (Scale 0-10; or none, mild, moderate, severe)     na 7. PAIN: Does the rash hurt? If Yes, ask: How bad is the pain?  (Scale 0-10; or none, mild, moderate, severe)     Moderate to severe 8. OTHER SYMPTOMS: Do you have any other symptoms? (e.g., fever)     no 9. PREGNANCY: Is there any chance you are pregnant? When was your last menstrual period?      na  Pt started on ABX then face turned red &burning.  Pt skipped 1 day of medication and face was not as red or burning.  When pt started back yesterday then the face began turning bright red and burning agein  Protocols used: Rash or Redness - Localized-A-AH

## 2024-05-13 NOTE — Telephone Encounter (Signed)
 Done

## 2024-05-14 DIAGNOSIS — M5442 Lumbago with sciatica, left side: Secondary | ICD-10-CM | POA: Diagnosis not present

## 2024-05-29 ENCOUNTER — Ambulatory Visit

## 2024-05-29 VITALS — Ht 64.0 in | Wt 123.0 lb

## 2024-05-29 DIAGNOSIS — Z0000 Encounter for general adult medical examination without abnormal findings: Secondary | ICD-10-CM | POA: Diagnosis not present

## 2024-05-29 NOTE — Patient Instructions (Addendum)
 [name],  Thank you for taking the time for your Medicare Wellness Visit. I appreciate your continued commitment to your health goals. Please review the care plan we discussed, and feel free to reach out if I can assist you further.  Medicare recommends these wellness visits once per year to help you and your care team stay ahead of potential health issues. These visits are designed to focus on prevention, allowing your provider to concentrate on managing your acute and chronic conditions during your regular appointments.  Please note that Annual Wellness Visits do not include a physical exam. Some assessments may be limited, especially if the visit was conducted virtually. If needed, we may recommend a separate in-person follow-up with your provider.  Ongoing Care Seeing your primary care provider every 3 to 6 months helps us  monitor your health and provide consistent, personalized care.    Referrals If a referral was made during today's visit and you haven't received any updates within two weeks, please contact the referred provider directly to check on the status.  Recommended Screenings:  Health Maintenance  Topic Date Due   Zoster (Shingles) Vaccine (1 of 2) Never done   COVID-19 Vaccine (9 - Moderna risk 2024-25 season) 05/05/2024   DEXA scan (bone density measurement)  10/21/2024   Medicare Annual Wellness Visit  05/29/2025   Colon Cancer Screening  01/02/2026   Breast Cancer Screening  04/23/2026   Pap with HPV screening  11/04/2026   DTaP/Tdap/Td vaccine (2 - Td or Tdap) 07/28/2029   Pneumococcal Vaccine for age over 77  Completed   Flu Shot  Completed   Hepatitis C Screening  Completed   HIV Screening  Completed   Hepatitis B Vaccine  Aged Out   HPV Vaccine  Aged Out   Meningitis B Vaccine  Aged Out  Opioid Pain Medicine Management Opioids are powerful medicines that are used to treat moderate to severe pain. When used for short periods of time, they can help you  to: Sleep better. Do better in physical or occupational therapy. Feel better in the first few days after an injury. Recover from surgery. Opioids should be taken with the supervision of a trained health care provider. They should be taken for the shortest period of time possible. This is because opioids can be addictive, and the longer you take opioids, the greater your risk of addiction. This addiction can also be called opioid use disorder. What are the risks? Using opioid pain medicines for longer than 3 days increases your risk of side effects. Side effects include: Constipation. Nausea and vomiting. Breathing difficulties (respiratory depression). Drowsiness. Confusion. Opioid use disorder. Itching. Taking opioid pain medicine for a long period of time can affect your ability to do daily tasks. It also puts you at risk for: Motor vehicle crashes. Depression. Suicide. Heart attack. Overdose, which can be life-threatening. What is a pain treatment plan? A pain treatment plan is an agreement between you and your health care provider. Pain is unique to each person, and treatments vary depending on your condition. To manage your pain, you and your health care provider need to work together. To help you do this: Discuss the goals of your treatment, including how much pain you might expect to have and how you will manage the pain. Review the risks and benefits of taking opioid medicines. Remember that a good treatment plan uses more than one approach and minimizes the chance of side effects. Be honest about the amount of medicines you take  and about any drug or alcohol use. Get pain medicine prescriptions from only one health care provider. Pain can be managed with many types of alternative treatments. Ask your health care provider to refer you to one or more specialists who can help you manage pain through: Physical or occupational therapy. Counseling (cognitive behavioral  therapy). Good nutrition. Biofeedback. Massage. Meditation. Non-opioid medicine. Following a gentle exercise program. How to use opioid pain medicine Taking medicine Take your pain medicine exactly as told by your health care provider. Take it only when you need it. If your pain gets less severe, you may take less than your prescribed dose if your health care provider approves. If you are not having pain, do nottake pain medicine unless your health care provider tells you to take it. If your pain is severe, do nottry to treat it yourself by taking more pills than instructed on your prescription. Contact your health care provider for help. Write down the times when you take your pain medicine. It is easy to become confused while on pain medicine. Writing the time can help you avoid overdose. Take other over-the-counter or prescription medicines only as told by your health care provider. Keeping yourself and others safe  While you are taking opioid pain medicine: Do not drive, use machinery, or power tools. Do not sign legal documents. Do not drink alcohol. Do not take sleeping pills. Do not supervise children by yourself. Do not do activities that require climbing or being in high places. Do not go to a lake, river, ocean, spa, or swimming pool. Do not share your pain medicine with anyone. Keep pain medicine in a locked cabinet or in a secure area where pets and children cannot reach it. Stopping your use of opioids If you have been taking opioid medicine for more than a few weeks, you may need to slowly decrease (taper) how much you take until you stop completely. Tapering your use of opioids can decrease your risk of symptoms of withdrawal, such as: Pain and cramping in the abdomen. Nausea. Sweating. Sleepiness. Restlessness. Uncontrollable shaking (tremors). Cravings for the medicine. Do not attempt to taper your use of opioids on your own. Talk with your health care provider  about how to do this. Your health care provider may prescribe a step-down schedule based on how much medicine you are taking and how long you have been taking it. Getting rid of leftover pills Do not save any leftover pills. Get rid of leftover pills safely by: Taking the medicine to a prescription take-back program. This is usually offered by the county or law enforcement. Bringing them to a pharmacy that has a drug disposal container. Flushing them down the toilet. Check the label or package insert of your medicine to see whether this is safe to do. Throwing them out in the trash. Check the label or package insert of your medicine to see whether this is safe to do. If it is safe to throw it out, remove the medicine from the original container, put it into a sealable bag or container, and mix it with used coffee grounds, food scraps, dirt, or cat litter before putting it in the trash. Follow these instructions at home: Activity Do exercises as told by your health care provider. Avoid activities that make your pain worse. Return to your normal activities as told by your health care provider. Ask your health care provider what activities are safe for you. General instructions You may need to take these actions to prevent  or treat constipation: Drink enough fluid to keep your urine pale yellow. Take over-the-counter or prescription medicines. Eat foods that are high in fiber, such as beans, whole grains, and fresh fruits and vegetables. Limit foods that are high in fat and processed sugars, such as fried or sweet foods. Keep all follow-up visits. This is important. Where to find support If you have been taking opioids for a long time, you may benefit from receiving support for quitting from a local support group or counselor. Ask your health care provider for a referral to these resources in your area. Where to find more information Centers for Disease Control and Prevention (CDC):  [employer]Ex[employer] [place] Food and Drug Administration (FDA): PumpkinSearch.com.ee Get help right away if: You may have taken too much of an opioid (overdosed). Common symptoms of an overdose: Your breathing is slower or more shallow than normal. You have a very slow heartbeat (pulse). You have slurred speech. You have nausea and vomiting. Your pupils become very small. You have other potential symptoms: You are very confused. You faint or feel like you will faint. You have cold, clammy skin. You have blue lips or fingernails. You have thoughts of harming yourself or harming others. These symptoms may represent a serious problem that is an emergency. Do not wait to see if the symptoms will go away. Get medical help right away. Call your local emergency services (911 in the [place] Do not drive yourself to the hospital.  If you ever feel like you may hurt yourself or others, or have thoughts about taking your own life, get help right away. Go to your nearest emergency department or: Call your local emergency services (911 in the [place] Call the [facility] ([phone] in the [place] Call a suicide crisis helpline, such as the National Suicide Prevention Lifeline at 7340686899 or 988 in the [place] This is open 24 hours a day in the [place] If you're a Veteran: Call 988 and press 1. This is open 24 hours a day. Text the [employer] at 610-448-2145. Summary Opioid medicines can help you manage moderate to severe pain for a short period of time. A pain treatment plan is an agreement between you and your health care provider. Discuss the goals of your treatment, including how much pain you might expect to have and how you will manage the pain. If you think that you or someone else may have taken too much of an opioid, get medical help right away. This information is not intended to replace advice given to you by your health care provider. Make sure you discuss any questions you have with  your health care provider. Document Revised: 05/28/2023 Document Reviewed: 12/01/2020 [employer] Patient Education  2024 [employer]     05/29/2024    1:13 PM  Advanced Directives  Does Patient Have a Medical Advance Directive? No  Would patient like information on creating a medical advance directive? No - Patient declined   Advance Care Planning is important because it: Ensures you receive medical care that aligns with your values, goals, and preferences. Provides guidance to your family and loved ones, reducing the emotional burden of decision-making during critical moments.  Vision: Annual vision screenings are recommended for early detection of glaucoma, cataracts, and diabetic retinopathy. These exams can also reveal signs of chronic conditions such as diabetes and high blood pressure.  Dental: Annual dental screenings help detect early signs of oral cancer, gum disease, and other conditions linked to overall health, including  heart disease and diabetes.  Please see the attached documents for additional preventive care recommendations.

## 2024-05-29 NOTE — Progress Notes (Signed)
 Subjective:   Christina Cantrell is a 63 y.o. who presents for a Medicare Wellness preventive visit.  As a reminder, Annual Wellness Visits don't include a physical exam, and some assessments may be limited, especially if this visit is performed virtually. We may recommend an in-person follow-up visit with your provider if needed.  Visit Complete: Virtual I connected with  Christina Cantrell on 05/29/24 by a audio enabled telemedicine application and verified that I am speaking with the correct person using two identifiers.  Patient Location: Home  Provider Location: Home Office  I discussed the limitations of evaluation and management by telemedicine. The patient expressed understanding and agreed to proceed.  Vital Signs: Because this visit was a virtual/telehealth visit, some criteria may be missing or patient reported. Any vitals not documented were not able to be obtained and vitals that have been documented are patient reported.    Persons Participating in Visit: Patient.  AWV Questionnaire: No: Patient Medicare AWV questionnaire was not completed prior to this visit.  Cardiac Risk Factors include: advanced age (>62men, >50 women)     Objective:    Today's Vitals   05/29/24 1305  Weight: 123 lb (55.8 kg)  Height: 5' 4 (1.626 m)   Body mass index is 21.11 kg/m.     05/29/2024    1:13 PM 12/14/2023    7:16 PM 08/28/2022    4:37 PM 03/02/2022    8:05 AM  Advanced Directives  Does Patient Have a Medical Advance Directive? No No No Yes  Does patient want to make changes to medical advance directive?    No - Patient declined  Would patient like information on creating a medical advance directive? No - Patient declined No - Patient declined      Current Medications (verified) Outpatient Encounter Medications as of 05/29/2024  Medication Sig   albuterol  (VENTOLIN  HFA) 108 (90 Base) MCG/ACT inhaler Inhale 2 puffs into the lungs every 6 (six) hours as needed for wheezing or  shortness of breath.   aspirin EC 81 MG tablet Take 81 mg by mouth daily. Swallow whole.   clopidogrel  (PLAVIX ) 75 MG tablet TAKE 1 TABLET BY MOUTH EVERY DAY   cyanocobalamin  (VITAMIN B12) 1000 MCG/ML injection INJECT 1 ML (1,000 MCG) INTRAMUSCULARLY EVERY 30 DAYS   cyclobenzaprine (FLEXERIL) 10 MG tablet Take 10 mg by mouth 3 (three) times daily as needed for muscle spasms.   doxycycline (VIBRAMYCIN) 50 MG capsule Take 50 mg by mouth 2 (two) times daily.   fentaNYL  (DURAGESIC ) 75 MCG/HR Place 1 patch onto the skin 2 days.   fluticasone  (FLONASE ) 50 MCG/ACT nasal spray Place 2 sprays into both nostrils daily.   metroNIDAZOLE (METROCREAM) 0.75 % cream Apply 1 Application topically 2 (two) times daily.   nitroGLYCERIN  (NITROSTAT ) 0.4 MG SL tablet Place 0.4 mg under the tongue every 5 (five) minutes as needed for chest pain.   omeprazole  (PRILOSEC) 40 MG capsule TAKE 1 CAPSULE (40 MG TOTAL) BY MOUTH DAILY.   oxyCODONE-acetaminophen (PERCOCET) 10-325 MG tablet Take 1 tablet by mouth every 4 (four) hours as needed for pain.   Polyethyl Glycol-Propyl Glycol (SYSTANE OP) Place 1 drop into both eyes daily as needed (dry eyes).   rosuvastatin  (CRESTOR ) 20 MG tablet Take 1 tablet (20 mg total) by mouth daily.   Vitamin D , Ergocalciferol , (DRISDOL ) 1.25 MG (50000 UNIT) CAPS capsule TAKE 1 CAPSULE (50,000 UNITS TOTAL) BY MOUTH TWO TIMES A WEEK   No facility-administered encounter medications on file as of  05/29/2024.    Allergies (verified) Amoxicillin , Zetia  [ezetimibe ], Atorvastatin, Nexletol  [bempedoic acid ], Sertraline hcl, and Repatha  [evolocumab ]   History: Past Medical History:  Diagnosis Date   Angina pectoris 03/16/2022   B12 deficiency 09/13/2022   Central sleep apnea comorbid with prescribed opioid use, moderate 10/30/2022   Central sleep apnea syndrome 05/30/2018   Centrilobular emphysema (HCC) 10/04/2021   Cervical radiculopathy 07/21/2021   Chronic low back pain 06/04/2013    Degeneration of lumbar intervertebral disc 06/04/2013   Epigastric pain 10/04/2021   Esophageal dysphagia 02/13/2022   Ex-smoker 03/16/2022   Familial hypercholesterolemia 12/22/2022   Familial hyperlipidemia 06/27/2021   GERD without esophagitis 09/13/2022   Localized osteoporosis without current pathological fracture 06/27/2021   Mixed hyperlipidemia 06/27/2021   Multiple sclerosis 1991   Age 38 yo   Need for influenza vaccination 05/28/2022   Neuropathic pain 11/12/2017   Optic neuritis 09/17/2018   Osteoarthritis 09/17/2018   Osteopenia 11/20/2018   Other chest pain 02/18/2022   Pain in thoracic spine 06/04/2013   Pain of upper abdomen 10/04/2021   Paresthesia 02/13/2022   Polyuria 02/18/2022   Screening for lung cancer 12/29/2021   Spinal stenosis 09/17/2018   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   Therapeutic opioid induced constipation 02/18/2022   Therapeutic opioid-induced constipation (OIC) 02/18/2022   Uncomplicated opioid dependence (HCC) 09/12/2021   Vitamin B12 deficiency 05/28/2022   Vitamin D  deficiency 06/27/2021   Past Surgical History:  Procedure Laterality Date   BREAST BIOPSY Bilateral 08/24/2020   BREAST EXCISIONAL BIOPSY Right 1982   IR ANGIOGRAM VISCERAL SELECTIVE  03/02/2022   IR RADIOLOGIST EVAL & MGMT  02/21/2022   IR RADIOLOGIST EVAL & MGMT  04/03/2022   IR RADIOLOGIST EVAL & MGMT  06/12/2022   IR RADIOLOGIST EVAL & MGMT  05/09/2023   IR RADIOLOGIST EVAL & MGMT  09/27/2023   IR TRANSCATH PLC STENT 1ST ART NOT LE CV CAR VERT CAR  03/02/2022   IR US  GUIDE VASC ACCESS RIGHT  03/02/2022   Family History  Family history unknown: Yes   Social History   Socioeconomic History   Marital status: Married    Spouse name: Lamar   Number of children: 1   Years of education: Not on file   Highest education level: Associate degree: occupational, Scientist, product/process development, or vocational program  Occupational History   Not on  file  Tobacco Use   Smoking status: Former    Current packs/day: 0.00    Types: Cigarettes    Start date: 1998    Quit date: 2018    Years since quitting: 7.7   Smokeless tobacco: Never  Vaping Use   Vaping status: Never Used  Substance and Sexual Activity   Alcohol use: Yes    Alcohol/week: 1.0 standard drink of alcohol    Types: 1 Glasses of wine per week    Comment: socially   Drug use: Never   Sexual activity: Not Currently    Partners: Male  Other Topics Concern   Not on file  Social History Narrative   Not on file   Social Drivers of Health   Financial Resource Strain: Low Risk  (05/29/2024)   Overall Financial Resource Strain (CARDIA)    Difficulty of Paying Living Expenses: Not hard at all  Food Insecurity: No Food Insecurity (05/29/2024)   Hunger Vital Sign    Worried About Running Out of Food in the Last Year: Never true    Ran Out of Food in  the Last Year: Never true  Transportation Needs: No Transportation Needs (05/06/2024)   PRAPARE - Administrator, Civil Service (Medical): No    Lack of Transportation (Non-Medical): No  Physical Activity: Insufficiently Active (05/29/2024)   Exercise Vital Sign    Days of Exercise per Week: 3 days    Minutes of Exercise per Session: 20 min  Stress: No Stress Concern Present (05/29/2024)   [employer] of Occupational Health - Occupational Stress Questionnaire    Feeling of Stress: Not at all  Social Connections: Socially Integrated (05/29/2024)   Social Connection and Isolation Panel    Frequency of Communication with Friends and Family: More than three times a week    Frequency of Social Gatherings with Friends and Family: More than three times a week    Attends Religious Services: More than 4 times per year    Active Member of [employer] or Organizations: Yes    Attends Engineer, structural: More than 4 times per year    Marital Status: Married    Tobacco Counseling Counseling given: Not  Answered    Clinical Intake:  Pre-visit preparation completed: Yes  Pain : No/denies pain     BMI - recorded: 21.11 Nutritional Status: BMI of 19-24  Normal Nutritional Risks: None Diabetes: No  No results found for: HGBA1C   How often do you need to have someone help you when you read instructions, pamphlets, or other written materials from your doctor or pharmacy?: 1 - Never  Interpreter Needed?: No  Information entered by :: Rojelio Blush LPN   Activities of Daily Living     05/29/2024    1:12 PM  In your present state of health, do you have any difficulty performing the following activities:  Hearing? 0  Vision? 0  Difficulty concentrating or making decisions? 0  Walking or climbing stairs? 0  Dressing or bathing? 0  Doing errands, shopping? 0  Preparing Food and eating ? N  Using the Toilet? N  In the past six months, have you accidently leaked urine? N  Do you have problems with loss of bowel control? N  Managing your Medications? N  Managing your Finances? N  Housekeeping or managing your Housekeeping? N    Patient Care Team: Sherre Clapper, MD as PCP - General (Family Medicine) Monetta Redell PARAS, MD as Consulting Physician (Cardiology) [place] Ferebee Desai, Sarah, PA-C as Physician Assistant (Neurosurgery) Misenheimer, Evalene, MD as Consulting Physician (Unknown Physician Specialty) Arloa Brunner, OD (Optometry) Kendell Marcey ORN, MD as Referring Physician (Family Medicine)  I have updated your Care Teams any recent Medical Services you may have received from other providers in the past year.     Assessment:   This is a routine wellness examination for Christina Cantrell.  Hearing/Vision screen Hearing Screening - Comments:: Denies hearing difficulties   Vision Screening - Comments:: Wears rx glasses - up to date with routine eye exams with  [facility]   Goals Addressed               This Visit's Progress     Increase physical activity  (pt-stated)        Get More Active       Depression Screen     05/29/2024    1:11 PM 07/18/2023    9:03 AM 04/05/2023   10:27 AM 12/18/2022   11:26 AM 10/04/2021   10:39 AM 09/16/2020    9:54 AM  PHQ 2/9 Scores  PHQ - 2  Score 0 0 0 0 0 4  PHQ- 9 Score  6 4  1 11     Fall Risk     05/29/2024    1:13 PM 04/19/2023    2:11 PM 04/05/2023   10:28 AM 12/18/2022   11:18 AM 10/04/2021   10:40 AM  Fall Risk   Falls in the past year? 0 0 0 0 0  Number falls in past yr: 0 0 0 0 0  Injury with Fall? 0 0 0 0 0  Risk for fall due to : No Fall Risks History of fall(s) History of fall(s) No Fall Risks   Follow up Falls evaluation completed Falls evaluation completed;Education provided Falls evaluation completed Falls evaluation completed Falls evaluation completed      Data saved with a previous flowsheet row definition    MEDICARE RISK AT HOME:  Medicare Risk at Home Any stairs in or around the home?: No If so, are there any without handrails?: Yes Home free of loose throw rugs in walkways, pet beds, electrical cords, etc?: Yes Adequate lighting in your home to reduce risk of falls?: Yes Life alert?: No Use of a cane, walker or w/c?: No Grab bars in the bathroom?: Yes Shower chair or bench in shower?: Yes Elevated toilet seat or a handicapped toilet?: No  TIMED UP AND GO:  Was the test performed?  No  Cognitive Function: 6CIT completed        05/29/2024    1:14 PM  6CIT Screen  What Year? 0 points  What month? 0 points  What time? 0 points  Count back from 20 0 points  Months in reverse 0 points  Repeat phrase 0 points  Total Score 0 points    Immunizations Immunization History  Administered Date(s) Administered   Influenza Inj Mdck Quad Pf 05/26/2022   Influenza, Mdck, Trivalent,PF 6+ MOS(egg free) 05/28/2023   Influenza,trivalent, recombinat, inj, PF 05/06/2024   Influenza-Unspecified 07/26/2020, 06/07/2021, 05/28/2023   Moderna Sars-Covid-2 Vaccination 11/06/2019,  12/05/2019, 12/25/2019, 05/05/2020   PFIZER Comirnaty(Gray Top)Covid-19 Tri-Sucrose Vaccine 12/13/2020, 06/07/2021   PNEUMOCOCCAL CONJUGATE-20 07/18/2023   Pfizer Covid-19 Vaccine Bivalent Booster 31yrs & up 06/07/2021   Pfizer(Comirnaty)Fall Seasonal Vaccine 12 years and older 05/26/2022, 05/28/2023   RSV,unspecified 10/29/2023   Respiratory Syncytial Virus Vaccine,Recomb Aduvanted(Arexvy) 10/29/2023   Tdap 07/29/2019    Screening Tests Health Maintenance  Topic Date Due   Zoster Vaccines- Shingrix (1 of 2) Never done   COVID-19 Vaccine (9 - Moderna risk 2024-25 season) 05/05/2024   DEXA SCAN  10/21/2024   Medicare Annual Wellness (AWV)  05/29/2025   Colonoscopy  01/02/2026   Mammogram  04/23/2026   Cervical Cancer Screening (HPV/Pap Cotest)  11/04/2026   DTaP/Tdap/Td (2 - Td or Tdap) 07/28/2029   Pneumococcal Vaccine: 50+ Years  Completed   Influenza Vaccine  Completed   Hepatitis C Screening  Completed   HIV Screening  Completed   Hepatitis B Vaccines 19-59 Average Risk  Aged Out   HPV VACCINES  Aged Out   Meningococcal B Vaccine  Aged Out    Health Maintenance Items Addressed:   Additional Screening:  Vision Screening: Recommended annual ophthalmology exams for early detection of glaucoma and other disorders of the eye. Is the patient up to date with their annual eye exam?  Yes  Who is the provider or what is the name of the office in which the patient attends annual eye exams? [facility]  Dental Screening: Recommended annual dental exams for proper  oral hygiene  Community Resource Referral / Chronic Care Management: CRR required this visit?  No   CCM required this visit?  No   Plan:    I have personally reviewed and noted the following in the patient's chart:   Medical and social history Use of alcohol, tobacco or illicit drugs  Current medications and supplements including opioid prescriptions. Patient is currently taking opioid prescriptions.  Information provided to patient regarding non-opioid alternatives. Patient advised to discuss non-opioid treatment plan with their provider. Functional ability and status Nutritional status Physical activity Advanced directives List of other physicians Hospitalizations, surgeries, and ER visits in previous 12 months Vitals Screenings to include cognitive, depression, and falls Referrals and appointments  In addition, I have reviewed and discussed with patient certain preventive protocols, quality metrics, and best practice recommendations. A written personalized care plan for preventive services as well as general preventive health recommendations were provided to patient.   Rojelio LELON Blush, LPN   [id number]   After Visit Summary: (MyChart) Due to this being a telephonic visit, the after visit summary with patients personalized plan was offered to patient via MyChart   Notes: Nothing significant to report at this time.

## 2024-06-03 ENCOUNTER — Encounter: Payer: Self-pay | Admitting: Vascular Surgery

## 2024-06-03 ENCOUNTER — Ambulatory Visit: Attending: Vascular Surgery | Admitting: Vascular Surgery

## 2024-06-03 VITALS — BP 129/77 | HR 102 | Temp 98.4°F | Resp 18 | Ht 64.0 in | Wt 125.3 lb

## 2024-06-03 DIAGNOSIS — I739 Peripheral vascular disease, unspecified: Secondary | ICD-10-CM | POA: Insufficient documentation

## 2024-06-03 NOTE — Progress Notes (Signed)
 Patient name: Christina Cantrell MRN: [id number] DOB: 1961/06/19 Sex: female  REASON FOR CONSULT: abnormal TBI  HPI: Christina Cantrell is a 63 y.o. female, with history of CAD, hyperlipidemia, MS and chronic neck and back pain that presents for evaluation of abnormal TBI's.  Patient has been sent here by Dr. Onetha with neurosurgery.  Her main complaint today is pain around her left hip that she states started her subsequent subspecialist evaluations.  No real pain in her leg when she walks.  No tissue loss.  She does have a history of SMA stent with IR.  Past Medical History:  Diagnosis Date   Angina pectoris 03/16/2022   B12 deficiency 09/13/2022   Central sleep apnea comorbid with prescribed opioid use, moderate 10/30/2022   Central sleep apnea syndrome 05/30/2018   Centrilobular emphysema (HCC) 10/04/2021   Cervical radiculopathy 07/21/2021   Chronic low back pain 06/04/2013   Degeneration of lumbar intervertebral disc 06/04/2013   Epigastric pain 10/04/2021   Esophageal dysphagia 02/13/2022   Ex-smoker 03/16/2022   Familial hypercholesterolemia 12/22/2022   Familial hyperlipidemia 06/27/2021   GERD without esophagitis 09/13/2022   Localized osteoporosis without current pathological fracture 06/27/2021   Mixed hyperlipidemia 06/27/2021   Multiple sclerosis 1991   Age 85 yo   Need for influenza vaccination 05/28/2022   Neuropathic pain 11/12/2017   Optic neuritis 09/17/2018   Osteoarthritis 09/17/2018   Osteopenia 11/20/2018   Other chest pain 02/18/2022   Pain in thoracic spine 06/04/2013   Pain of upper abdomen 10/04/2021   Paresthesia 02/13/2022   Polyuria 02/18/2022   Screening for lung cancer 12/29/2021   Spinal stenosis 09/17/2018   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   Therapeutic opioid induced constipation 02/18/2022   Therapeutic opioid-induced constipation (OIC) 02/18/2022   Uncomplicated opioid dependence (HCC)  09/12/2021   Vitamin B12 deficiency 05/28/2022   Vitamin D  deficiency 06/27/2021    Past Surgical History:  Procedure Laterality Date   BREAST BIOPSY Bilateral 08/24/2020   BREAST EXCISIONAL BIOPSY Right 1982   IR ANGIOGRAM VISCERAL SELECTIVE  03/02/2022   IR RADIOLOGIST EVAL & MGMT  02/21/2022   IR RADIOLOGIST EVAL & MGMT  04/03/2022   IR RADIOLOGIST EVAL & MGMT  06/12/2022   IR RADIOLOGIST EVAL & MGMT  05/09/2023   IR RADIOLOGIST EVAL & MGMT  09/27/2023   IR TRANSCATH PLC STENT 1ST ART NOT LE CV CAR VERT CAR  03/02/2022   IR US  GUIDE VASC ACCESS RIGHT  03/02/2022    Family History  Family history unknown: Yes    SOCIAL HISTORY: Social History   Socioeconomic History   Marital status: Married    Spouse name: Lamar   Number of children: 1   Years of education: Not on file   Highest education level: Associate degree: occupational, Scientist, product/process development, or vocational program  Occupational History   Not on file  Tobacco Use   Smoking status: Former    Current packs/day: 0.00    Types: Cigarettes    Start date: 1998    Quit date: 2018    Years since quitting: 7.7   Smokeless tobacco: Never  Vaping Use   Vaping status: Never Used  Substance and Sexual Activity   Alcohol use: Yes    Alcohol/week: 1.0 standard drink of alcohol    Types: 1 Glasses of wine per week    Comment: socially   Drug use: Never   Sexual activity: Not Currently    Partners: Male  Other Topics Concern   Not on file  Social History Narrative   Not on file   Social Drivers of Health   Financial Resource Strain: Low Risk  (05/29/2024)   Overall Financial Resource Strain (CARDIA)    Difficulty of Paying Living Expenses: Not hard at all  Food Insecurity: No Food Insecurity (05/29/2024)   Hunger Vital Sign    Worried About Running Out of Food in the Last Year: Never true    Ran Out of Food in the Last Year: Never true  Transportation Needs: No Transportation Needs (05/06/2024)   PRAPARE - Therapist, art (Medical): No    Lack of Transportation (Non-Medical): No  Physical Activity: Insufficiently Active (05/29/2024)   Exercise Vital Sign    Days of Exercise per Week: 3 days    Minutes of Exercise per Session: 20 min  Stress: No Stress Concern Present (05/29/2024)   [employer] of Occupational Health - Occupational Stress Questionnaire    Feeling of Stress: Not at all  Social Connections: Socially Integrated (05/29/2024)   Social Connection and Isolation Panel    Frequency of Communication with Friends and Family: More than three times a week    Frequency of Social Gatherings with Friends and Family: More than three times a week    Attends Religious Services: More than 4 times per year    Active Member of [employer] or Organizations: Yes    Attends Engineer, structural: More than 4 times per year    Marital Status: Married  Catering manager Violence: Not At Risk (05/06/2024)   Humiliation, Afraid, Rape, and Kick questionnaire    Fear of Current or Ex-Partner: No    Emotionally Abused: No    Physically Abused: No    Sexually Abused: No    Allergies  Allergen Reactions   Amoxicillin  Rash    Painful rash   Zetia  [Ezetimibe ]     Right knee swelling within one day.    Atorvastatin     Throat closed up.    Nexletol  [Bempedoic Acid ]     Left knee pain    Sertraline Hcl Hives   Repatha  [Evolocumab ] Other (See Comments)    Headache and Brain Fog    Current Outpatient Medications  Medication Sig Dispense Refill   albuterol  (VENTOLIN  HFA) 108 (90 Base) MCG/ACT inhaler Inhale 2 puffs into the lungs every 6 (six) hours as needed for wheezing or shortness of breath. 18 g 3   aspirin EC 81 MG tablet Take 81 mg by mouth daily. Swallow whole.     clopidogrel  (PLAVIX ) 75 MG tablet TAKE 1 TABLET BY MOUTH EVERY DAY 90 tablet 1   cyanocobalamin  (VITAMIN B12) 1000 MCG/ML injection INJECT 1 ML (1,000 MCG) INTRAMUSCULARLY EVERY 30 DAYS 3 mL 1   cyclobenzaprine  (FLEXERIL) 10 MG tablet Take 10 mg by mouth 3 (three) times daily as needed for muscle spasms.     doxycycline (VIBRAMYCIN) 50 MG capsule Take 50 mg by mouth 2 (two) times daily.     fentaNYL  (DURAGESIC ) 75 MCG/HR Place 1 patch onto the skin 2 days.     fluticasone  (FLONASE ) 50 MCG/ACT nasal spray Place 2 sprays into both nostrils daily. 48 g 3   metroNIDAZOLE (METROCREAM) 0.75 % cream Apply 1 Application topically 2 (two) times daily.     nitroGLYCERIN  (NITROSTAT ) 0.4 MG SL tablet Place 0.4 mg under the tongue every 5 (five) minutes as needed for chest pain.     omeprazole  (  PRILOSEC) 40 MG capsule TAKE 1 CAPSULE (40 MG TOTAL) BY MOUTH DAILY. 90 capsule 1   oxyCODONE-acetaminophen (PERCOCET) 10-325 MG tablet Take 1 tablet by mouth every 4 (four) hours as needed for pain.     Polyethyl Glycol-Propyl Glycol (SYSTANE OP) Place 1 drop into both eyes daily as needed (dry eyes).     rosuvastatin  (CRESTOR ) 20 MG tablet Take 1 tablet (20 mg total) by mouth daily. 90 tablet 3   Vitamin D , Ergocalciferol , (DRISDOL ) 1.25 MG (50000 UNIT) CAPS capsule TAKE 1 CAPSULE (50,000 UNITS TOTAL) BY MOUTH TWO TIMES A WEEK 24 capsule 1   No current facility-administered medications for this visit.    REVIEW OF SYSTEMS:  [X]  denotes positive finding, [ ]  denotes negative finding Cardiac  Comments:  Chest pain or chest pressure:    Shortness of breath upon exertion:    Short of breath when lying flat:    Irregular heart rhythm:        Vascular    Pain in calf, thigh, or hip brought on by ambulation:    Pain in feet at night that wakes you up from your sleep:     Blood clot in your veins:    Leg swelling:         Pulmonary    Oxygen at home:    Productive cough:     Wheezing:         Neurologic    Sudden weakness in arms or legs:     Sudden numbness in arms or legs:     Sudden onset of difficulty speaking or slurred speech:    Temporary loss of vision in one eye:     Problems with dizziness:          Gastrointestinal    Blood in stool:     Vomited blood:         Genitourinary    Burning when urinating:     Blood in urine:        Psychiatric    Major depression:         Hematologic    Bleeding problems:    Problems with blood clotting too easily:        Skin    Rashes or ulcers:        Constitutional    Fever or chills:      PHYSICAL EXAM: Vitals:   06/03/24 1504  BP: 129/77  Pulse: (!) 102  Resp: 18  Temp: 98.4 F (36.9 C)  TempSrc: Temporal  SpO2: 99%  Weight: 125 lb 4.8 oz (56.8 kg)  Height: 5' 4 (1.626 m)    GENERAL: The patient is a well-nourished female, in no acute distress. The vital signs are documented above. CARDIAC: There is a regular rate and rhythm.  VASCULAR:  Bilateral femoral pulses palpable Bilateral PT pulses palpable No lower extremity tissue loss PULMONARY: No respiratory distress. ABDOMEN: Soft and non-tender. MUSCULOSKELETAL: There are no major deformities or cyanosis. NEUROLOGIC: No focal weakness or paresthesias are detected. SKIN: There are no ulcers or rashes noted. PSYCHIATRIC: The patient has a normal affect.  DATA:   ABIs 1.01 on the right multiphasic and 0.95 on the left multiphasic with toe pressure 34 on the right  Bilateral lower extremity arterial duplex shows mild atherosclerosis throughout both lower extremities with no evidence of significant stenosis and triphasic waveforms  Assessment/Plan:  63 y.o. female, with history of CAD, hyperlipidemia, MS and chronic neck and back pain that presents for evaluation of  abnormal TBI's.  Patient has been sent here by Dr. Onetha with neurosurgery.  Her main complaint today is pain around her left hip that she states started her subsequent subspecialist evaluations.  I discussed that she has palpable pedal pulses in both lower extremities.  She did have bilateral lower extremity arterial duplexes that show only mild disease with no flow-limiting stenosis.  Her ABIs are preserved  at the ankle.  She does have abnormal TBI as a relates to the toe pressures but there is really no role for revascularization as this is not the cause of her hip pain.  Discussed an indication to purse intervention would be non-healing wounds which she does not have.  We can follow her with repeat ABIs in 1 year.  I discussed for asymptomatic PAD we recommend walking therapies with aspirin statin and no smoking.   Lonni DOROTHA Gaskins, MD [facility] Office: [phone]

## 2024-06-10 DIAGNOSIS — G35D Multiple sclerosis, unspecified: Secondary | ICD-10-CM | POA: Diagnosis not present

## 2024-06-10 DIAGNOSIS — R2689 Other abnormalities of gait and mobility: Secondary | ICD-10-CM | POA: Insufficient documentation

## 2024-06-24 ENCOUNTER — Other Ambulatory Visit: Payer: Self-pay | Admitting: Family Medicine

## 2024-06-24 DIAGNOSIS — K219 Gastro-esophageal reflux disease without esophagitis: Secondary | ICD-10-CM

## 2024-07-03 DIAGNOSIS — F1120 Opioid dependence, uncomplicated: Secondary | ICD-10-CM | POA: Diagnosis not present

## 2024-07-03 DIAGNOSIS — G35D Multiple sclerosis, unspecified: Secondary | ICD-10-CM | POA: Diagnosis not present

## 2024-07-16 ENCOUNTER — Ambulatory Visit (INDEPENDENT_AMBULATORY_CARE_PROVIDER_SITE_OTHER): Admitting: Family Medicine

## 2024-07-16 VITALS — BP 128/82 | HR 108 | Temp 97.6°F | Ht 64.0 in | Wt 127.0 lb

## 2024-07-16 DIAGNOSIS — G4731 Primary central sleep apnea: Secondary | ICD-10-CM | POA: Diagnosis not present

## 2024-07-16 DIAGNOSIS — K219 Gastro-esophageal reflux disease without esophagitis: Secondary | ICD-10-CM

## 2024-07-16 DIAGNOSIS — E538 Deficiency of other specified B group vitamins: Secondary | ICD-10-CM | POA: Diagnosis not present

## 2024-07-16 DIAGNOSIS — L719 Rosacea, unspecified: Secondary | ICD-10-CM | POA: Diagnosis not present

## 2024-07-16 DIAGNOSIS — J301 Allergic rhinitis due to pollen: Secondary | ICD-10-CM

## 2024-07-16 DIAGNOSIS — G35D Multiple sclerosis, unspecified: Secondary | ICD-10-CM

## 2024-07-16 DIAGNOSIS — E782 Mixed hyperlipidemia: Secondary | ICD-10-CM

## 2024-07-16 DIAGNOSIS — E559 Vitamin D deficiency, unspecified: Secondary | ICD-10-CM | POA: Diagnosis not present

## 2024-07-16 MED ORDER — DOXYCYCLINE HYCLATE 50 MG PO CAPS: 50.0000 mg | ORAL_CAPSULE | Freq: Two times a day (BID) | ORAL | 3 refills | Status: AC

## 2024-07-16 MED ORDER — CYANOCOBALAMIN 1000 MCG/ML IJ SOLN
1000.0000 ug | Freq: Once | INTRAMUSCULAR | Status: AC
  Administered 2024-07-16: 1000 ug via INTRAMUSCULAR

## 2024-07-16 NOTE — Assessment & Plan Note (Addendum)
 Managed with rosuvastatin  20 mg daily. Recent cholesterol levels were good. - Continue rosuvastatin  20 mg daily.

## 2024-07-16 NOTE — Assessment & Plan Note (Addendum)
 SABRA

## 2024-07-16 NOTE — Assessment & Plan Note (Addendum)
 Benefits and is compliant and using CPAP.  On modafinil

## 2024-07-16 NOTE — Assessment & Plan Note (Addendum)
 Gastroesophageal reflux disease Managed with omeprazole  40 mg as needed. - Continue omeprazole  40 mg as needed.

## 2024-07-16 NOTE — Assessment & Plan Note (Addendum)
 Monitor. If worsens, will refer to neurology.

## 2024-07-16 NOTE — Progress Notes (Signed)
 Subjective:  Patient ID: Christina Cantrell, female    DOB: 03-06-1961  Age: 63 y.o. MRN: [id number]  Chief Complaint  Patient presents with   Medical Management of Chronic Issues   Discussed the use of AI scribe software for clinical note transcription with the patient, who gave verbal consent to proceed.  History of Present Illness Christina Cantrell is a 63 year old female who presents for a routine follow-up visit.  Dyslipidemia and vascular disease management - Rosuvastatin  20 mg daily for cholesterol management - Plavix  75 mg daily and aspirin 81 mg daily for vascular disease - No significant bleeding issues, but minor cuts bleed more than usual - Not using Repatha   Chronic pain management - Fentanyl  patch 75 mcg every 48 hours for pain control - Percocet 10/325 mg as needed, prefers to avoid unless necessary  Vitamin deficiency management - Monthly B12 injections, which her husband normally administers, but she has brought today.  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 - Fluid accumulation and discomfort on affected side of face - Previously treated with antibiotics, currently not on antibiotics due to concerns about resistance        07/16/2024   10:24 AM 05/29/2024    1:11 PM 07/18/2023    9:03 AM 04/05/2023   10:27 AM 12/18/2022   11:26 AM  Depression screen PHQ 2/9  Decreased  Interest 0 0 0 0 0  Down, Depressed, Hopeless 0 0 0 0 0  PHQ - 2 Score 0 0 0 0 0  Altered sleeping 0  3 1   Tired, decreased energy 0  3 3   Change in appetite 0  0 0   Feeling bad or failure about yourself  0  0 0   Trouble concentrating 0  0 0   Moving slowly or fidgety/restless 0  0 0   Suicidal thoughts 0  0 0   PHQ-9 Score 0  6  4    Difficult doing work/chores Not difficult at all  Somewhat difficult Not difficult at all      Data saved with a previous flowsheet row definition        07/16/2024   10:24 AM  Fall Risk   Falls in the past year? 0  Number falls in past yr: 0  Injury with Fall? 0  Risk for fall due to : No Fall Risks  Follow up Falls evaluation completed    Patient Care Team: Sherre Clapper, MD as PCP - General (Family Medicine) Monetta Redell PARAS, MD as Consulting Physician (Cardiology) Tonette Meyer Meade Lauraine DEVONNA as Physician Assistant (Neurosurgery) Misenheimer, Evalene, MD as Consulting Physician (Unknown Physician Specialty) Arloa Brunner, OD (Optometry) Kendell Marcey ORN, MD as Referring Physician (Family Medicine)   Review of Systems  Constitutional:  Negative for appetite change, fatigue and fever.  HENT:  Negative for congestion, ear pain, sinus pressure and sore throat.   Respiratory:  Negative for cough, chest tightness, shortness of  breath and wheezing.   Cardiovascular:  Negative for chest pain and palpitations.  Gastrointestinal:  Negative for abdominal pain, constipation, diarrhea, nausea and vomiting.  Genitourinary:  Negative for dysuria and hematuria.  Musculoskeletal:  Negative for arthralgias, back pain, joint swelling and myalgias.  Skin:  Negative for rash.  Neurological:  Negative for dizziness, weakness and headaches.  Psychiatric/Behavioral:  Negative for dysphoric mood. The patient is not nervous/anxious.     Current Outpatient Medications on File Prior to Visit  Medication Sig Dispense Refill   albuterol  (VENTOLIN  HFA) 108  (90 Base) MCG/ACT inhaler Inhale 2 puffs into the lungs every 6 (six) hours as needed for wheezing or shortness of breath. 18 g 3   aspirin EC 81 MG tablet Take 81 mg by mouth daily. Swallow whole.     clopidogrel  (PLAVIX ) 75 MG tablet TAKE 1 TABLET BY MOUTH EVERY DAY 90 tablet 1   COMIRNATY syringe Inject 0.3 mLs into the muscle once.     cyanocobalamin  (VITAMIN B12) 1000 MCG/ML injection INJECT 1 ML (1,000 MCG) INTRAMUSCULARLY EVERY 30 DAYS 3 mL 1   cyclobenzaprine (FLEXERIL) 10 MG tablet Take 10 mg by mouth 3 (three) times daily as needed for muscle spasms.     fentaNYL  (DURAGESIC ) 75 MCG/HR Place 1 patch onto the skin 2 days.     fluticasone  (FLONASE ) 50 MCG/ACT nasal spray Place 2 sprays into both nostrils daily. 48 g 3   metroNIDAZOLE (METROCREAM) 0.75 % cream Apply 1 Application topically 2 (two) times daily.     nitroGLYCERIN  (NITROSTAT ) 0.4 MG SL tablet Place 0.4 mg under the tongue every 5 (five) minutes as needed for chest pain.     omeprazole  (PRILOSEC) 40 MG capsule TAKE 1 CAPSULE (40 MG TOTAL) BY MOUTH DAILY. 90 capsule 1   oxyCODONE-acetaminophen (PERCOCET) 10-325 MG tablet Take 1 tablet by mouth every 4 (four) hours as needed for pain.     Polyethyl Glycol-Propyl Glycol (SYSTANE OP) Place 1 drop into both eyes daily as needed (dry eyes).     rosuvastatin  (CRESTOR ) 20 MG tablet Take 1 tablet (20 mg total) by mouth daily. 90 tablet 3   Vitamin D , Ergocalciferol , (DRISDOL ) 1.25 MG (50000 UNIT) CAPS capsule TAKE 1 CAPSULE (50,000 UNITS TOTAL) BY MOUTH TWO TIMES A WEEK 24 capsule 1   No current facility-administered medications on file prior to visit.   Past Medical History:  Diagnosis Date   Angina pectoris 03/16/2022   B12 deficiency 09/13/2022   Central sleep apnea comorbid with prescribed opioid use, moderate 10/30/2022   Central sleep apnea syndrome 05/30/2018   Centrilobular emphysema (HCC) 10/04/2021   Cervical radiculopathy 07/21/2021   Chronic low back pain 06/04/2013    Degeneration of lumbar intervertebral disc 06/04/2013   Epigastric pain 10/04/2021   Esophageal dysphagia 02/13/2022   Ex-smoker 03/16/2022   Familial hypercholesterolemia 12/22/2022   Familial hyperlipidemia 06/27/2021   GERD without esophagitis 09/13/2022   Localized osteoporosis without current pathological fracture 06/27/2021   Mixed hyperlipidemia 06/27/2021   Multiple sclerosis 1991   Age 37 yo   Need for influenza vaccination 05/28/2022   Neuropathic pain 11/12/2017   Optic neuritis 09/17/2018   Osteoarthritis 09/17/2018   Osteopenia 11/20/2018   Other chest pain 02/18/2022   Pain in thoracic spine 06/04/2013   Pain of upper abdomen 10/04/2021   Paresthesia 02/13/2022   Polyuria 02/18/2022   Screening for lung cancer 12/29/2021   Spinal stenosis 09/17/2018   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  Therapeutic opioid induced constipation 02/18/2022   Therapeutic opioid-induced constipation (OIC) 02/18/2022   Uncomplicated opioid dependence (HCC) 09/12/2021   Vitamin B12 deficiency 05/28/2022   Vitamin D  deficiency 06/27/2021   Past Surgical History:  Procedure Laterality Date   BREAST BIOPSY Bilateral 08/24/2020   BREAST EXCISIONAL BIOPSY Right 1982   IR ANGIOGRAM VISCERAL SELECTIVE  03/02/2022   IR RADIOLOGIST EVAL & MGMT  02/21/2022   IR RADIOLOGIST EVAL & MGMT  04/03/2022   IR RADIOLOGIST EVAL & MGMT  06/12/2022   IR RADIOLOGIST EVAL & MGMT  05/09/2023   IR RADIOLOGIST EVAL & MGMT  09/27/2023   IR TRANSCATH PLC STENT 1ST ART NOT LE CV CAR VERT CAR  03/02/2022   IR US  GUIDE VASC ACCESS RIGHT  03/02/2022    Family History  Family history unknown: Yes   Social History   Socioeconomic History   Marital status: Married    Spouse name: Lamar   Number of children: 1   Years of education: Not on file   Highest education level: Associate degree: occupational, scientist, product/process development, or vocational program  Occupational History   Not  on file  Tobacco Use   Smoking status: Former    Current packs/day: 0.00    Types: Cigarettes    Start date: 1998    Quit date: 2018    Years since quitting: 7.8   Smokeless tobacco: Never  Vaping Use   Vaping status: Never Used  Substance and Sexual Activity   Alcohol use: Yes    Alcohol/week: 1.0 standard drink of alcohol    Types: 1 Glasses of wine per week    Comment: socially   Drug use: Never   Sexual activity: Not Currently    Partners: Male  Other Topics Concern   Not on file  Social History Narrative   Not on file   Social Drivers of Health   Financial Resource Strain: Low Risk  (07/16/2024)   Overall Financial Resource Strain (CARDIA)    Difficulty of Paying Living Expenses: Not very hard  Food Insecurity: No Food Insecurity (07/16/2024)   Hunger Vital Sign    Worried About Running Out of Food in the Last Year: Never true    Ran Out of Food in the Last Year: Never true  Transportation Needs: No Transportation Needs (07/16/2024)   PRAPARE - Administrator, Civil Service (Medical): No    Lack of Transportation (Non-Medical): No  Physical Activity: Insufficiently Active (07/16/2024)   Exercise Vital Sign    Days of Exercise per Week: 2 days    Minutes of Exercise per Session: 40 min  Stress: No Stress Concern Present (07/16/2024)   [employer] of Occupational Health - Occupational Stress Questionnaire    Feeling of Stress: Only a little  Social Connections: Unknown (07/16/2024)   Social Connection and Isolation Panel    Frequency of Communication with Friends and Family: Once a week    Frequency of Social Gatherings with Friends and Family: Patient declined    Attends Religious Services: 1 to 4 times per year    Active Member of [employer] or Organizations: No    Attends Engineer, Structural: Not on file    Marital Status: Married    Objective:  BP 128/82 (BP Location: Left Arm, Patient Position: Sitting)   Pulse (!) 108   Temp 97.6  F (36.4 C) (Temporal)   Ht 5' 4 (1.626 m)   Wt 127 lb (57.6 kg)   SpO2 96%  BMI 21.80 kg/m      07/16/2024   10:20 AM 06/03/2024    3:04 PM 05/29/2024    1:05 PM  BP/Weight  Systolic BP 128 129 --  Diastolic BP 82 77 --  Wt. (Lbs) 127 125.3 123  BMI 21.8 kg/m2 21.51 kg/m2 21.11 kg/m2    Physical Exam Vitals reviewed.  Constitutional:      Appearance: Normal appearance. She is normal weight.  HENT:     Right Ear: Tympanic membrane normal.     Left Ear: Tympanic membrane normal.  Neck:     Vascular: No carotid bruit.  Cardiovascular:     Rate and Rhythm: Normal rate and regular rhythm.     Heart sounds: Normal heart sounds.  Pulmonary:     Effort: Pulmonary effort is normal. No respiratory distress.     Breath sounds: Normal breath sounds.  Abdominal:     General: Abdomen is flat. Bowel sounds are normal.     Palpations: Abdomen is soft.     Tenderness: There is no abdominal tenderness.  Neurological:     Mental Status: She is alert and oriented to person, place, and time.  Psychiatric:        Mood and Affect: Mood normal.        Behavior: Behavior normal.         Lab Results  Component Value Date   WBC 4.9 05/06/2024   HGB 14.2 05/06/2024   HCT 44.4 05/06/2024   PLT 303 05/06/2024   GLUCOSE 97 05/06/2024   CHOL 173 05/06/2024   TRIG 72 05/06/2024   HDL 72 05/06/2024   LDLCALC 87 05/06/2024   ALT 12 05/06/2024   AST 17 05/06/2024   NA 140 05/06/2024   K 5.0 05/06/2024   CL 101 05/06/2024   CREATININE 0.91 05/06/2024   BUN 13 05/06/2024   CO2 25 05/06/2024   TSH 3.150 07/18/2023   INR 1.0 03/02/2022    Results for orders placed or performed in visit on 05/06/24  Comprehensive metabolic panel with GFR   Collection Time: 05/06/24  3:03 PM  Result Value Ref Range   Glucose 97 70 - 99 mg/dL   BUN 13 8 - 27 mg/dL   Creatinine, Ser 9.08 0.57 - 1.00 mg/dL   eGFR 72 >40 fO/fpw/8.26   BUN/Creatinine Ratio 14 12 - 28   Sodium 140 134 - 144 mmol/L    Potassium 5.0 3.5 - 5.2 mmol/L   Chloride 101 96 - 106 mmol/L   CO2 25 20 - 29 mmol/L   Calcium  9.9 8.7 - 10.3 mg/dL   Total Protein 6.6 6.0 - 8.5 g/dL   Albumin 4.5 3.9 - 4.9 g/dL   Globulin, Total 2.1 1.5 - 4.5 g/dL   Bilirubin Total 0.5 0.0 - 1.2 mg/dL   Alkaline Phosphatase 88 44 - 121 IU/L   AST 17 0 - 40 IU/L   ALT 12 0 - 32 IU/L  CBC with Differential/Platelet   Collection Time: 05/06/24  3:03 PM  Result Value Ref Range   WBC 4.9 3.4 - 10.8 x10E3/uL   RBC 4.73 3.77 - 5.28 x10E6/uL   Hemoglobin 14.2 11.1 - 15.9 g/dL   Hematocrit 55.5 65.9 - 46.6 %   MCV 94 79 - 97 fL   MCH 30.0 26.6 - 33.0 pg   MCHC 32.0 31.5 - 35.7 g/dL   RDW 87.4 88.2 - 84.5 %   Platelets 303 150 - 450 x10E3/uL   Neutrophils 41 Not Estab. %  Lymphs 48 Not Estab. %   Monocytes 8 Not Estab. %   Eos 2 Not Estab. %   Basos 1 Not Estab. %   Neutrophils Absolute 2.0 1.4 - 7.0 x10E3/uL   Lymphocytes Absolute 2.3 0.7 - 3.1 x10E3/uL   Monocytes Absolute 0.4 0.1 - 0.9 x10E3/uL   EOS (ABSOLUTE) 0.1 0.0 - 0.4 x10E3/uL   Basophils Absolute 0.0 0.0 - 0.2 x10E3/uL   Immature Granulocytes 0 Not Estab. %   Immature Grans (Abs) 0.0 0.0 - 0.1 x10E3/uL  Lipid panel   Collection Time: 05/06/24  3:03 PM  Result Value Ref Range   Cholesterol, Total 173 100 - 199 mg/dL   Triglycerides 72 0 - 149 mg/dL   HDL 72 >60 mg/dL   VLDL Cholesterol Cal 14 5 - 40 mg/dL   LDL Chol Calc (NIH) 87 0 - 99 mg/dL   Chol/HDL Ratio 2.4 0.0 - 4.4 ratio  .  Assessment & Plan:   Assessment & Plan Mixed hyperlipidemia Managed with rosuvastatin  20 mg daily. Recent cholesterol levels were good. - Continue rosuvastatin  20 mg daily.    Seasonal allergic rhinitis due to pollen     GERD without esophagitis Gastroesophageal reflux disease Managed with omeprazole  40 mg as needed. - Continue omeprazole  40 mg as needed.    Multiple sclerosis Monitor. If worsens, will refer to neurology.      Central sleep apnea  syndrome Benefits and is compliant and using CPAP.  On modafinil     Vitamin D  deficiency Managed with rosuvastatin  20 mg daily. Recent cholesterol levels were good. - Continue rosuvastatin  20 mg daily.    B12 deficiency  Orders:   cyanocobalamin  (VITAMIN B12) injection 1,000 mcg  Rosacea  Orders:   doxycycline (VIBRAMYCIN) 50 MG capsule; Take 1 capsule (50 mg total) by mouth 2 (two) times daily.    Body mass index is 21.8 kg/m.     Meds ordered this encounter  Medications   doxycycline (VIBRAMYCIN) 50 MG capsule    Sig: Take 1 capsule (50 mg total) by mouth 2 (two) times daily.    Dispense:  180 capsule    Refill:  3   cyanocobalamin  (VITAMIN B12) injection 1,000 mcg    No orders of the defined types were placed in this encounter.      Follow-up: Return in about 4 months (around 11/17/2024) for chronic follow up.  An After Visit Summary was printed and given to the patient.     I,Lauren M Auman,acting as a scribe for Abigail Free, MD.,have documented all relevant documentation on the behalf of Abigail Free, MD,as directed by  Abigail Free, MD while in the presence of Abigail Free, MD.   Abigail Free, MD [facility] [phone]

## 2024-07-20 DIAGNOSIS — I999 Unspecified disorder of circulatory system: Secondary | ICD-10-CM | POA: Insufficient documentation

## 2024-07-24 DIAGNOSIS — Z87891 Personal history of nicotine dependence: Secondary | ICD-10-CM | POA: Diagnosis not present

## 2024-07-24 DIAGNOSIS — Z122 Encounter for screening for malignant neoplasm of respiratory organs: Secondary | ICD-10-CM | POA: Diagnosis not present

## 2024-07-25 DIAGNOSIS — Z87891 Personal history of nicotine dependence: Secondary | ICD-10-CM | POA: Diagnosis not present

## 2024-07-31 ENCOUNTER — Other Ambulatory Visit: Payer: Self-pay | Admitting: Family Medicine

## 2024-07-31 DIAGNOSIS — K551 Chronic vascular disorders of intestine: Secondary | ICD-10-CM

## 2024-08-06 ENCOUNTER — Ambulatory Visit: Admitting: Family Medicine

## 2024-08-07 ENCOUNTER — Other Ambulatory Visit: Payer: Self-pay | Admitting: Interventional Radiology

## 2024-08-07 DIAGNOSIS — K551 Chronic vascular disorders of intestine: Secondary | ICD-10-CM

## 2024-08-20 ENCOUNTER — Inpatient Hospital Stay
Admission: RE | Admit: 2024-08-20 | Discharge: 2024-08-20 | Attending: Interventional Radiology | Admitting: Interventional Radiology

## 2024-08-20 DIAGNOSIS — K551 Chronic vascular disorders of intestine: Secondary | ICD-10-CM

## 2024-08-22 ENCOUNTER — Inpatient Hospital Stay
Admission: RE | Admit: 2024-08-22 | Discharge: 2024-08-22 | Attending: Interventional Radiology | Admitting: Interventional Radiology

## 2024-08-22 DIAGNOSIS — K551 Chronic vascular disorders of intestine: Secondary | ICD-10-CM

## 2024-08-22 HISTORY — PX: IR RADIOLOGIST EVAL & MGMT: IMG5224

## 2024-08-22 NOTE — Progress Notes (Signed)
 "      Chief Complaint: Patient was seen in consultation today for follow up after prior SMA stenting.  History of Present Illness: Christina Cantrell is a 63 y.o. 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 The procedure initially dramatically improved symptoms with resolution of intestinal angina. She had some mild recurrent symptoms earlier this year in January with CTA demonstrating a patent stent without stenosis. She has requested follow up and imaging due to some worsening symptoms over the last 6 months with increased pain after eating solid meals, but not liquids. She has lost about 5-7 pounds in weight over 6 months. A duplex ultrasound of the mesenteric arteries was performed on 08/20/24.  Past Medical History:  Diagnosis Date   Angina pectoris 03/16/2022   B12 deficiency 09/13/2022   Central sleep apnea comorbid with prescribed opioid use, moderate 10/30/2022   Central sleep apnea syndrome 05/30/2018   Centrilobular emphysema (HCC) 10/04/2021   Cervical radiculopathy 07/21/2021   Chronic low back pain 06/04/2013   Degeneration of lumbar intervertebral disc 06/04/2013   Epigastric pain 10/04/2021   Esophageal dysphagia 02/13/2022   Ex-smoker 03/16/2022   Familial hypercholesterolemia 12/22/2022   Familial hyperlipidemia 06/27/2021   GERD without esophagitis 09/13/2022   Localized osteoporosis without current pathological fracture 06/27/2021   Mixed hyperlipidemia 06/27/2021   Multiple sclerosis 1991   Age 66 yo   Need for influenza vaccination 05/28/2022   Neuropathic pain 11/12/2017   Optic neuritis 09/17/2018   Osteoarthritis 09/17/2018   Osteopenia 11/20/2018   Other chest pain 02/18/2022   Pain in thoracic spine 06/04/2013   Pain of upper abdomen 10/04/2021   Paresthesia 02/13/2022    Polyuria 02/18/2022   Screening for lung cancer 12/29/2021   Spinal stenosis 09/17/2018   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   Therapeutic opioid induced constipation 02/18/2022   Therapeutic opioid-induced constipation (OIC) 02/18/2022   Uncomplicated opioid dependence (HCC) 09/12/2021   Vitamin B12 deficiency 05/28/2022   Vitamin D  deficiency 06/27/2021    Past Surgical History:  Procedure Laterality Date   BREAST BIOPSY Bilateral 08/24/2020   BREAST EXCISIONAL BIOPSY Right 1982   IR ANGIOGRAM VISCERAL SELECTIVE  03/02/2022   IR RADIOLOGIST EVAL & MGMT  02/21/2022   IR RADIOLOGIST EVAL & MGMT  04/03/2022   IR RADIOLOGIST EVAL & MGMT  06/12/2022   IR RADIOLOGIST EVAL & MGMT  05/09/2023   IR RADIOLOGIST EVAL & MGMT  09/27/2023   IR TRANSCATH PLC STENT 1ST ART NOT LE CV CAR VERT CAR  03/02/2022   IR US  GUIDE VASC ACCESS RIGHT  03/02/2022    Allergies: Amoxicillin , Zetia  [ezetimibe ], Atorvastatin, Nexletol  [bempedoic acid ], Sertraline hcl, and Repatha  [evolocumab ]  Medications: Prior to Admission medications  Medication Sig Start Date End Date Taking? Authorizing Provider  albuterol  (VENTOLIN  HFA) 108 (90 Base) MCG/ACT inhaler Inhale 2 puffs into the lungs every 6 (six) hours as needed for wheezing or shortness of breath. 07/10/23   Sherre Clapper, MD  aspirin EC 81 MG tablet Take 81 mg by mouth daily. Swallow whole.    [provider]  clopidogrel  (PLAVIX ) 75 MG tablet TAKE 1 TABLET BY MOUTH EVERY DAY 08/01/24   Cox, Clapper, MD  COMIRNATY syringe Inject 0.3 mLs into the muscle once. 05/26/24   [provider]  cyanocobalamin  (VITAMIN B12) 1000 MCG/ML  injection INJECT 1 ML (1,000 MCG) INTRAMUSCULARLY EVERY 30 DAYS 06/24/24   Cox, Kirsten, MD  cyclobenzaprine (FLEXERIL) 10 MG tablet Take 10 mg by mouth 3 (three) times daily as needed for muscle spasms. 07/31/20   [provider]  doxycycline  (VIBRAMYCIN ) 50 MG  capsule Take 1 capsule (50 mg total) by mouth 2 (two) times daily. 07/16/24   CoxAbigail, MD  fentaNYL  (DURAGESIC ) 75 MCG/HR Place 1 patch onto the skin 2 days.    [provider]  fluticasone  (FLONASE ) 50 MCG/ACT nasal spray Place 2 sprays into both nostrils daily. 05/06/24   Cox, Abigail, MD  metroNIDAZOLE (METROCREAM) 0.75 % cream Apply 1 Application topically 2 (two) times daily. 09/07/21   [provider]  nitroGLYCERIN  (NITROSTAT ) 0.4 MG SL tablet Place 0.4 mg under the tongue every 5 (five) minutes as needed for chest pain.    [provider]  omeprazole  (PRILOSEC) 40 MG capsule TAKE 1 CAPSULE (40 MG TOTAL) BY MOUTH DAILY. 06/24/24   CoxAbigail, MD  oxyCODONE-acetaminophen (PERCOCET) 10-325 MG tablet Take 1 tablet by mouth every 4 (four) hours as needed for pain. 08/13/20   [provider]  Polyethyl Glycol-Propyl Glycol (SYSTANE OP) Place 1 drop into both eyes daily as needed (dry eyes).    [provider]  rosuvastatin  (CRESTOR ) 20 MG tablet Take 1 tablet (20 mg total) by mouth daily. 12/26/23   Teressa Harrie HERO, FNP  Vitamin D , Ergocalciferol , (DRISDOL ) 1.25 MG (50000 UNIT) CAPS capsule TAKE 1 CAPSULE (50,000 UNITS TOTAL) BY MOUTH TWO TIMES A WEEK 08/27/23   CoxAbigail, MD     Family History  Family history unknown: Yes    Social History   Socioeconomic History   Marital status: Married    Spouse name: Lamar   Number of children: 1   Years of education: Not on file   Highest education level: Associate degree: occupational, scientist, product/process development, or vocational program  Occupational History   Not on file  Tobacco Use   Smoking status: Former    Current packs/day: 0.00    Types: Cigarettes    Start date: 1998    Quit date: 2018    Years since quitting: 7.9   Smokeless tobacco: Never  Vaping Use   Vaping status: Never Used  Substance and Sexual Activity   Alcohol use: Yes    Alcohol/week: 1.0 standard drink of alcohol    Types: 1 Glasses  of wine per week    Comment: socially   Drug use: Never   Sexual activity: Not Currently    Partners: Male  Other Topics Concern   Not on file  Social History Narrative   Not on file   Social Drivers of Health   Tobacco Use: Medium Risk (07/16/2024)   Patient History    Smoking Tobacco Use: Former    Smokeless Tobacco Use: Never    Passive Exposure: Not on file  Financial Resource Strain: Low Risk (07/16/2024)   Overall Financial Resource Strain (CARDIA)    Difficulty of Paying Living Expenses: Not very hard  Food Insecurity: No Food Insecurity (07/16/2024)   Epic    Worried About Radiation Protection Practitioner of Food in the Last Year: Never true    Ran Out of Food in the Last Year: Never true  Transportation Needs: No Transportation Needs (07/16/2024)   Epic    Lack of Transportation (Medical): No    Lack of Transportation (Non-Medical): No  Physical Activity: Insufficiently Active (07/16/2024)   Exercise Vital Sign  Days of Exercise per Week: 2 days    Minutes of Exercise per Session: 40 min  Stress: No Stress Concern Present (07/16/2024)   [employer] of Occupational Health - Occupational Stress Questionnaire    Feeling of Stress: Only a little  Social Connections: Unknown (07/16/2024)   Social Connection and Isolation Panel    Frequency of Communication with Friends and Family: Once a week    Frequency of Social Gatherings with Friends and Family: Patient declined    Attends Religious Services: 1 to 4 times per year    Active Member of Clubs or Organizations: No    Attends Engineer, Structural: Not on file    Marital Status: Married  Depression (PHQ2-9): Low Risk (07/16/2024)   Depression (PHQ2-9)    PHQ-2 Score: 0  Alcohol Screen: Low Risk (05/29/2024)   Alcohol Screen    Last Alcohol Screening Score (AUDIT): 0  Housing: Unknown (07/16/2024)   Epic    Unable to Pay for Housing in the Last Year: No    Number of Times Moved in the Last Year: Not on file     Homeless in the Last Year: No  Utilities: Not At Risk (05/06/2024)   Epic    Threatened with loss of utilities: No  Health Literacy: Adequate Health Literacy (05/29/2024)   B1300 Health Literacy    Frequency of need for help with medical instructions: Never     Review of Systems: A 12 point ROS discussed and pertinent positives are indicated in the HPI above.  All other systems are negative.  Review of Systems  Constitutional:  Positive for unexpected weight change.  Respiratory: Negative.    Cardiovascular: Negative.   Gastrointestinal:  Positive for abdominal pain. Negative for abdominal distention, anal bleeding, blood in stool, diarrhea, nausea and vomiting.  Genitourinary: Negative.   Musculoskeletal: Negative.   Neurological:        Baseline chronic multiple sclerosis symptoms with some worsening in lower extremities recently.    Vital Signs: BP (!) 149/84 (BP Location: Left Arm, Patient Position: Sitting, Cuff Size: Normal)   Pulse 84   Temp 98.2 F (36.8 C) (Oral)   Resp 16   SpO2 94%    Physical Exam Vitals reviewed.  Constitutional:      General: She is not in acute distress.    Appearance: Normal appearance. She is not ill-appearing or toxic-appearing.  Abdominal:     General: There is no distension.     Palpations: Abdomen is soft.     Tenderness: There is no abdominal tenderness. There is no guarding or rebound.  Neurological:     General: No focal deficit present.     Mental Status: She is alert and oriented to person, place, and time.      Imaging: US  MESENTERIC ARTERIES Result Date: 08/21/2024 EXAM: US  mesenteric doppler 08/20/2024 10:17:26 AM TECHNIQUE: Doppler ultrasound of the mesenteric vessels was performed utilizing grayscale, color Doppler and spectral Doppler techniques. COMPARISON: Prior CT arteriogram of 12/14/2023 and mesenteric doppler sonogram of 04/23/2023. CLINICAL HISTORY: f/u SMA Stent Placed 03-02-2022 FINDINGS: The aorta demonstrates a  high resistance arterial waveform with peak systolic velocities of up to 167 cm/sec proximally and 134 cm/sec distally. Velocities within the juxta- celiac aorta are 62 - 65 cm/sec. No evidence of hemodynamically significant stenosis with deep inspiration or expiration. This appears improved since prior examination, though the low resistance arterial waveform on prior exam may reflect a postprandial or vasodilatory state on the prior exam. Moderate  atherosclerotic calcification throughout the abdominal aorta. No aortic aneurysm. There is an increasingly low resistance arterial waveform within the proximal superior mesenteric artery with peak systolic velocity elevation now approaching 283 cm/sec in keeping with a greater than 70% stenosis, new since prior examination. Distally, the vessel demonstrates more reasonable peak systolic velocities of 160 cm per second. The inferior mesenteric artery is patent at the origin with peak systolic velocities of 303 cm per second. IMPRESSION: 1. Increasingly low resistance arterial waveform within the proximal superior mesenteric artery with peak systolic velocity elevation now approaching 283 cm/sec, consistent with greater than 70% stenosis, new since prior examination. 2. Moderate atherosclerotic calcification throughout the abdominal aorta. Electronically signed by: Dorethia Molt MD 08/21/2024 03:21 AM EST RP Workstation: [id number]    Labs:  CBC: Recent Labs    12/14/23 1929 12/19/23 1659 01/30/24 0941 05/06/24 1503  WBC 6.5 6.6 5.8 4.9  HGB 15.0 15.1 14.4 14.2  HCT 44.7 45.4 45.5 44.4  PLT 365 393 283 303    COAGS: No results for input(s): INR, APTT in the last 8760 hours.  BMP: Recent Labs    12/14/23 1929 12/19/23 1659 01/30/24 0941 05/06/24 1503  NA 139 142 139 140  K 4.4 4.3 4.7 5.0  CL 102 102 102 101  CO2 27 23 22 25   GLUCOSE 94 95 94 97  BUN 7 8 12 13   CALCIUM  9.7 9.9 9.8 9.9  CREATININE 0.75 0.77 0.75 0.91  GFRNONAA >60  --    --   --     LIVER FUNCTION TESTS: Recent Labs    12/14/23 1929 12/19/23 1659 01/30/24 0941 05/06/24 1503  BILITOT 0.7 0.4 0.5 0.5  AST 20 16 29 17   ALT 20 16 25 12   ALKPHOS 74 94 90 88  PROT 7.1 6.7 6.3 6.6  ALBUMIN 4.8 4.6 4.5 4.5     Assessment and Plan:  I met with Christina Cantrell and we reviewed the recent mesenteric duplex study. The celiac axis and IMA are normally patent. There is increased velocity at the proximal end of the SMA stent up to 283 cm/sec with turbulence and spectral broadening that now meets criteria for estimated >70% stenosis. Prior duplex velocity in 2024 was 172 cm/sec in the same segment. Given increasing clinical symptoms and weight loss, I recommended we go ahead with arteriography with possible angioplasty and possible placement of a second overlapping stent, if needed. I told Christina Cantrell she does not need to hold her aspirin or Plavix  for the procedure. We will schedule the procedure at [facility].   Electronically Signed: Marcey ONEIDA Moan 08/22/2024, 4:57 PM    I spent a total of 15 Minutes in face to face in clinical consultation, greater than 50% of which was counseling/coordinating care for SMA stenosis and intestinal angina.  "

## 2024-08-26 ENCOUNTER — Other Ambulatory Visit: Payer: Self-pay

## 2024-08-26 ENCOUNTER — Encounter (HOSPITAL_COMMUNITY): Payer: Self-pay

## 2024-08-26 ENCOUNTER — Telehealth: Payer: Self-pay | Admitting: Student

## 2024-08-26 ENCOUNTER — Emergency Department (HOSPITAL_COMMUNITY)

## 2024-08-26 ENCOUNTER — Emergency Department (HOSPITAL_COMMUNITY)
Admission: EM | Admit: 2024-08-26 | Discharge: 2024-08-26 | Disposition: A | Discharge status: Home / Self Care | Attending: Emergency Medicine | Admitting: Emergency Medicine

## 2024-08-26 DIAGNOSIS — Z7982 Long term (current) use of aspirin: Secondary | ICD-10-CM | POA: Diagnosis not present

## 2024-08-26 DIAGNOSIS — Z7901 Long term (current) use of anticoagulants: Secondary | ICD-10-CM | POA: Diagnosis not present

## 2024-08-26 DIAGNOSIS — K2900 Acute gastritis without bleeding: Secondary | ICD-10-CM | POA: Insufficient documentation

## 2024-08-26 DIAGNOSIS — R1013 Epigastric pain: Secondary | ICD-10-CM | POA: Diagnosis present

## 2024-08-26 LAB — CBC WITH DIFFERENTIAL/PLATELET
Abs Immature Granulocytes: 0.01 K/uL (ref 0.00–0.07)
Basophils Absolute: 0 K/uL (ref 0.0–0.1)
Basophils Relative: 1 %
Eosinophils Absolute: 0 K/uL (ref 0.0–0.5)
Eosinophils Relative: 1 %
HCT: 42.7 % (ref 36.0–46.0)
Hemoglobin: 14 g/dL (ref 12.0–15.0)
Immature Granulocytes: 0 %
Lymphocytes Relative: 24 %
Lymphs Abs: 1.1 K/uL (ref 0.7–4.0)
MCH: 30.4 pg (ref 26.0–34.0)
MCHC: 32.8 g/dL (ref 30.0–36.0)
MCV: 92.6 fL (ref 80.0–100.0)
Monocytes Absolute: 0.3 K/uL (ref 0.1–1.0)
Monocytes Relative: 6 %
Neutro Abs: 3.2 K/uL (ref 1.7–7.7)
Neutrophils Relative %: 68 %
Platelets: 314 K/uL (ref 150–400)
RBC: 4.61 MIL/uL (ref 3.87–5.11)
RDW: 12.3 % (ref 11.5–15.5)
WBC: 4.7 K/uL (ref 4.0–10.5)
nRBC: 0 % (ref 0.0–0.2)

## 2024-08-26 LAB — COMPREHENSIVE METABOLIC PANEL WITH GFR
ALT: 10 U/L (ref 0–44)
AST: 19 U/L (ref 15–41)
Albumin: 4.4 g/dL (ref 3.5–5.0)
Alkaline Phosphatase: 78 U/L (ref 38–126)
Anion gap: 9 (ref 5–15)
BUN: 12 mg/dL (ref 8–23)
CO2: 25 mmol/L (ref 22–32)
Calcium: 9.5 mg/dL (ref 8.9–10.3)
Chloride: 105 mmol/L (ref 98–111)
Creatinine, Ser: 0.65 mg/dL (ref 0.44–1.00)
GFR, Estimated: >60 mL/min
Glucose, Bld: 109 mg/dL — ABNORMAL HIGH (ref 70–99)
Potassium: 4.1 mmol/L (ref 3.5–5.1)
Sodium: 139 mmol/L (ref 135–145)
Total Bilirubin: 0.6 mg/dL (ref 0.0–1.2)
Total Protein: 6.5 g/dL (ref 6.5–8.1)

## 2024-08-26 LAB — URINALYSIS, ROUTINE W REFLEX MICROSCOPIC
Bilirubin Urine: NEGATIVE
Glucose, UA: NEGATIVE mg/dL
Hgb urine dipstick: NEGATIVE
Ketones, ur: NEGATIVE mg/dL
Leukocytes,Ua: NEGATIVE
Nitrite: NEGATIVE
Protein, ur: NEGATIVE mg/dL
Specific Gravity, Urine: 1.014 (ref 1.005–1.030)
pH: 5 (ref 5.0–8.0)

## 2024-08-26 LAB — I-STAT CG4 LACTIC ACID, ED: Lactic Acid, Venous: 0.6 mmol/L (ref 0.5–1.9)

## 2024-08-26 LAB — LIPASE, BLOOD: Lipase: 19 U/L (ref 11–51)

## 2024-08-26 MED ORDER — ALUM & MAG HYDROXIDE-SIMETH 200-200-20 MG/5ML PO SUSP
30.0000 mL | Freq: Once | ORAL | Status: AC
  Administered 2024-08-26: 30 mL via ORAL
  Filled 2024-08-26: qty 30

## 2024-08-26 MED ORDER — ONDANSETRON HCL 4 MG/2ML IJ SOLN
4.0000 mg | Freq: Once | INTRAMUSCULAR | Status: AC
  Administered 2024-08-26: 4 mg via INTRAVENOUS
  Filled 2024-08-26: qty 2

## 2024-08-26 MED ORDER — DICYCLOMINE HCL 10 MG PO CAPS
10.0000 mg | ORAL_CAPSULE | Freq: Once | ORAL | Status: AC
  Administered 2024-08-26: 10 mg via ORAL
  Filled 2024-08-26: qty 1

## 2024-08-26 MED ORDER — HYDROMORPHONE HCL 1 MG/ML IJ SOLN
0.5000 mg | Freq: Once | INTRAMUSCULAR | Status: AC
  Administered 2024-08-26: 0.5 mg via INTRAVENOUS
  Filled 2024-08-26: qty 1

## 2024-08-26 MED ORDER — IOHEXOL 350 MG/ML SOLN
75.0000 mL | Freq: Once | INTRAVENOUS | Status: AC | PRN
  Administered 2024-08-26: 75 mL via INTRAVENOUS

## 2024-08-26 MED ORDER — SODIUM CHLORIDE 0.9 % IV BOLUS
1000.0000 mL | Freq: Once | INTRAVENOUS | Status: AC
  Administered 2024-08-26: 1000 mL via INTRAVENOUS

## 2024-08-26 MED ORDER — FAMOTIDINE 20 MG PO TABS
20.0000 mg | ORAL_TABLET | Freq: Once | ORAL | Status: AC
  Administered 2024-08-26: 20 mg via ORAL
  Filled 2024-08-26: qty 1

## 2024-08-26 MED ORDER — FAMOTIDINE 20 MG PO TABS: 20.0000 mg | ORAL_TABLET | Freq: Two times a day (BID) | ORAL | 0 refills | Status: AC

## 2024-08-26 MED ORDER — LIDOCAINE VISCOUS HCL 2 % MT SOLN
15.0000 mL | Freq: Once | OROMUCOSAL | Status: AC
  Administered 2024-08-26: 15 mL via ORAL
  Filled 2024-08-26: qty 15

## 2024-08-26 NOTE — ED Provider Triage Note (Signed)
 Emergency Medicine Provider Triage Evaluation Note  Christina Cantrell , a 63 y.o. female  was evaluated in triage.  Pt complains of abdominal pain.  Patient has known mesenteric artery stenosis and had a stent in place and follows outpatient with vascular surgery.  She last saw Dr. Gretta of vascular surgery in clinic on June 03, 2024.  She had previously undergone SMA stent with interventional radiology.  Today she woke up with epigastric abdominal pain with associated nausea.  She states the pain is currently 6 out of 10 in severity.  She is not passing gas.  Review of Systems  Positive: Abd Pain, nausea Negative: CP, Fever  Physical Exam  BP (!) 162/98 (BP Location: Left Arm)   Pulse (!) 104   Temp 98.3 F (36.8 C)   Resp 18   SpO2 99%  Gen:   Awake, no distress, appears uncomfortable Resp:  Normal effort, lungs CTAB MSK:   Moves extremities without difficulty Abd:  Epigastric TTP, guarding present   Medical Decision Making  Medically screening exam initiated at 9:35 AM.  Appropriate orders placed.  Lonell KATHEE Fujita was informed that the remainder of the evaluation will be completed by another provider, this initial triage assessment does not replace that evaluation, and the importance of remaining in the ED until their evaluation is complete.  Differential diagnosis includes mesenteric ischemia, small bowel obstruction, pancreatitis, other acute intra-abdominal emergency.  Will obtain CTA to further evaluate, IV Dilaudid , Zofran  and fluids ordered in addition to screening labs.   Jerrol Agent, MD 08/26/24 1049

## 2024-08-26 NOTE — ED Notes (Signed)
 Transported to CT

## 2024-08-26 NOTE — Telephone Encounter (Signed)
 Patient is well known to IR service for SMA stenting performed by Dr. Luverne on 03/02/22, she has been followed since then.   She was recently seen by Dr. Luverne on 08/22/24, see Dr. Herminia recommendation below:  I met with Mrs. Blumenstein and we reviewed the recent mesenteric duplex study. The celiac axis and IMA are normally patent. There is increased velocity at the proximal end of the SMA stent up to 283 cm/sec with turbulence and spectral broadening that now meets criteria for estimated >70% stenosis. Prior duplex velocity in 2024 was 172 cm/sec in the same segment. Given increasing clinical symptoms and weight loss, I recommended we go ahead with arteriography with possible angioplasty and possible placement of a second overlapping stent, if needed. I told Mrs. Andujo she does not need to hold her aspirin or Plavix  for the procedure. We will schedule the procedure at [facility].   IR schedulers reached out to the patient this morning to scheduler the procedure, patient reported that she now is in significant abdominal pain. IR APP was asked for eval.   Patient called, ID verified.  Patient states that she developed nausea and epigastric pain around 3 am, drank some fluid which made the nausea and pain worse. She has not had BM recently but the pain she is experiencing is different from the pain from constipation. Patient was advised to go to [facility] ED for eval and possible admission so that IR can expedite the procedure. She was notified that Dr. Luverne is on vacation and the procedure will have to be performed by other IR radiologist is needed. She verbalized understanding, she is on her way to [facility] ED.   IR will follow.   Demarquez Ciolek H Zakariyah Freimark PA-C 08/26/2024 8:45 AM

## 2024-08-26 NOTE — ED Provider Notes (Signed)
 " [place] EMERGENCY DEPARTMENT AT [facility] Provider Note   CSN: [id number] Arrival date & time: 08/26/24  9074     Patient presents with: Abdominal Pain and Nausea   Christina Cantrell is a 63 y.o. female.    Abdominal Pain Associated symptoms: nausea      63 year old female with medical history significant for SMA stenosis status post stent placement with interventional radiology presenting to the emergency department with a chief complaint of epigastric abdominal pain.  The patient has a fentanyl  patch in place for chronic pain.  She has frequent problems with constipation.  Her last bowel movement was several days ago.  She is not passing gas.  She states that she has had 6 out of 10 pain in the epigastrium.  It is associated with nausea.  She was drinking alcohol yesterday and questions whether this could have triggered some of her pain and discomfort.  She has had no vomiting.  She is primarily concerned that she may have an issue with her stent given her history of stenosis.  Prior to Admission medications  Medication Sig Start Date End Date Taking? Authorizing Provider  famotidine  (PEPCID ) 20 MG tablet Take 1 tablet (20 mg total) by mouth 2 (two) times daily. 08/26/24  Yes Jerrol Agent, MD  albuterol  (VENTOLIN  HFA) 108 (90 Base) MCG/ACT inhaler Inhale 2 puffs into the lungs every 6 (six) hours as needed for wheezing or shortness of breath. 07/10/23   Sherre Clapper, MD  aspirin EC 81 MG tablet Take 81 mg by mouth daily. Swallow whole.    [provider]  clopidogrel  (PLAVIX ) 75 MG tablet TAKE 1 TABLET BY MOUTH EVERY DAY 08/01/24   Cox, Clapper, MD  COMIRNATY syringe Inject 0.3 mLs into the muscle once. 05/26/24   [provider]  cyanocobalamin  (VITAMIN B12) 1000 MCG/ML injection INJECT 1 ML (1,000 MCG) INTRAMUSCULARLY EVERY 30 DAYS 06/24/24   Cox, Kirsten, MD  cyclobenzaprine (FLEXERIL) 10 MG tablet Take 10 mg by mouth 3 (three) times daily as needed  for muscle spasms. 07/31/20   [provider]  doxycycline  (VIBRAMYCIN ) 50 MG capsule Take 1 capsule (50 mg total) by mouth 2 (two) times daily. 07/16/24   CoxClapper, MD  fentaNYL  (DURAGESIC ) 75 MCG/HR Place 1 patch onto the skin 2 days.    [provider]  fluticasone  (FLONASE ) 50 MCG/ACT nasal spray Place 2 sprays into both nostrils daily. 05/06/24   Cox, Clapper, MD  metroNIDAZOLE (METROCREAM) 0.75 % cream Apply 1 Application topically 2 (two) times daily. 09/07/21   [provider]  nitroGLYCERIN  (NITROSTAT ) 0.4 MG SL tablet Place 0.4 mg under the tongue every 5 (five) minutes as needed for chest pain.    [provider]  omeprazole  (PRILOSEC) 40 MG capsule TAKE 1 CAPSULE (40 MG TOTAL) BY MOUTH DAILY. 06/24/24   CoxClapper, MD  oxyCODONE-acetaminophen (PERCOCET) 10-325 MG tablet Take 1 tablet by mouth every 4 (four) hours as needed for pain. 08/13/20   [provider]  Polyethyl Glycol-Propyl Glycol (SYSTANE OP) Place 1 drop into both eyes daily as needed (dry eyes).    [provider]  rosuvastatin  (CRESTOR ) 20 MG tablet Take 1 tablet (20 mg total) by mouth daily. 12/26/23   Teressa Harrie HERO, FNP  Vitamin D , Ergocalciferol , (DRISDOL ) 1.25 MG (50000 UNIT) CAPS capsule TAKE 1 CAPSULE (50,000 UNITS TOTAL) BY MOUTH TWO TIMES A WEEK 08/27/23   CoxClapper, MD    Allergies: Amoxicillin , Zetia  [ezetimibe ], Atorvastatin, Nexletol  [  bempedoic acid ], Sertraline hcl, and Repatha  [evolocumab ]    Review of Systems  Gastrointestinal:  Positive for abdominal pain and nausea.  All other systems reviewed and are negative.   Updated Vital Signs BP 116/67 (BP Location: Left Arm)   Pulse 83   Temp 97.8 F (36.6 C) (Oral)   Resp 18   Ht 5' 4 (1.626 m)   Wt 55.3 kg   SpO2 99%   BMI 20.94 kg/m   Physical Exam Vitals and nursing note reviewed.  Constitutional:      General: She is not in acute distress.    Appearance: She is well-developed.   HENT:     Head: Normocephalic and atraumatic.  Eyes:     Conjunctiva/sclera: Conjunctivae normal.  Cardiovascular:     Rate and Rhythm: Normal rate and regular rhythm.     Heart sounds: No murmur heard. Pulmonary:     Effort: Pulmonary effort is normal. No respiratory distress.     Breath sounds: Normal breath sounds.  Abdominal:     Palpations: Abdomen is soft.     Tenderness: There is abdominal tenderness in the epigastric area. There is guarding.  Musculoskeletal:        General: No swelling.     Cervical back: Neck supple.  Skin:    General: Skin is warm and dry.     Capillary Refill: Capillary refill takes less than 2 seconds.  Neurological:     Mental Status: She is alert.  Psychiatric:        Mood and Affect: Mood normal.     (all labs ordered are listed, but only abnormal results are displayed) Labs Reviewed  COMPREHENSIVE METABOLIC PANEL WITH GFR - Abnormal; Notable for the following components:      Result Value   Glucose, Bld 109 (*)    All other components within normal limits  CBC WITH DIFFERENTIAL/PLATELET  LIPASE, BLOOD  URINALYSIS, ROUTINE W REFLEX MICROSCOPIC  I-STAT CG4 LACTIC ACID, ED    EKG: None  Radiology: CT Angio Abd/Pel W and/or Wo Contrast Result Date: 08/26/2024 EXAM: CTA ABDOMEN AND PELVIS WITH CONTRAST 08/26/2024 11:56:10 AM TECHNIQUE: CTA images of the abdomen and pelvis with intravenous contrast. 75 mL (iohexol  (OMNIPAQUE ) 350 MG/ML injection 75 mL IOHEXOL  350 MG/ML SOLN). Three-dimensional MIP/volume rendered formations were performed. Automated exposure control, iterative reconstruction, and/or weight based adjustment of the mA/kV was utilized to reduce the radiation dose to as low as reasonably achievable. COMPARISON: 08/20/2024, 12/14/2023, 08/29/2022 CLINICAL HISTORY: Mesenteric ischemia, acute; Abd pain, known mesenteric artery stenosis. FINDINGS: VASCULATURE: AORTA: Diffuse aortoiliac atherosclerosis common predominantly calcified.  No acute finding. No abdominal aortic aneurysm. No dissection. CELIAC TRUNK: No acute finding. No occlusion or significant stenosis. SUPERIOR MESENTERIC ARTERY: No acute finding. No occlusion or significant stenosis. RENAL ARTERIES: No acute finding. No occlusion or significant stenosis. ILIAC ARTERIES: Diffuse aortoiliac atherosclerosis common predominantly calcified. There is a mixed density plaque at the iliac bifurcation causing moderate stenosis of both ostia of the iliac arteries. Multifocal calcified and noncalcified atherosclerosis throughout the iliac arteries bilaterally. LIVER: Small left hepatic lobe cyst with a likely small hemangioma in the right hepatic lobe. GALLBLADDER AND BILE DUCTS: Gallbladder is unremarkable. No biliary ductal dilatation. SPLEEN: The spleen is unremarkable. PANCREAS: The pancreas is unremarkable. ADRENAL GLANDS: Bilateral adrenal glands demonstrate no acute abnormality. KIDNEYS, URETERS AND BLADDER: Unchanged small left lower pole renal cyst. Decompressed urinary bladder. No stones in the kidneys or ureters. No hydronephrosis. No perinephric or periureteral stranding. GI AND  BOWEL: Normal appendix. Stomach and duodenal sweep demonstrate no acute abnormality. There is no bowel obstruction. No abnormal bowel wall thickening or distension. REPRODUCTIVE: Age related atrophy of the uterus and ovaries. PERITONEUM AND RETRPERITONEUM: No ascites or free air. LUNG BASE: Unchanged 2 mm micronodule in the right lower lobe, benign. LYMPH NODES: No lymphadenopathy. BONES AND SOFT TISSUES: Intervertebral disc replacement at L4-L5 and L5-S1 with overlying laminectomy changes. Multilevel facet arthropathy in the lumbar spine. No acute soft tissue abnormality. IMPRESSION: 1. Diffuse aortoiliac atherosclerosis with moderate stenosis of both ostia of the iliac arteries due to mixed density plaque at the iliac bifurcation, with multifocal calcified and noncalcified atherosclerosis throughout the  iliac arteries bilaterally. No aortic aneurysm or aortic dissection. 2. The celiac artery is widely patent. Patent proximal sma stenting noted. No acute thrombus or hemodynamically significant stenosis in the mesenteric vessels. . Electronically signed by: Rogelia Myers MD 08/26/2024 01:16 PM EST RP Workstation: [id number]     Procedures   Medications Ordered in the ED  sodium chloride  0.9 % bolus 1,000 mL (0 mLs Intravenous Stopped 08/26/24 1356)  HYDROmorphone  (DILAUDID ) injection 0.5 mg (0.5 mg Intravenous Given 08/26/24 1135)  ondansetron  (ZOFRAN ) injection 4 mg (4 mg Intravenous Given 08/26/24 1134)  iohexol  (OMNIPAQUE ) 350 MG/ML injection 75 mL (75 mLs Intravenous Contrast Given 08/26/24 1157)  alum & mag hydroxide-simeth (MAALOX/MYLANTA) 200-200-20 MG/5ML suspension 30 mL (30 mLs Oral Given 08/26/24 1409)    And  lidocaine  (XYLOCAINE ) 2 % viscous mouth solution 15 mL (15 mLs Oral Given 08/26/24 1409)  dicyclomine  (BENTYL ) capsule 10 mg (10 mg Oral Given 08/26/24 1409)  famotidine  (PEPCID ) tablet 20 mg (20 mg Oral Given 08/26/24 1409)                                    Medical Decision Making Amount and/or Complexity of Data Reviewed Labs: ordered. Radiology: ordered.  Risk OTC drugs. Prescription drug management.     63 year old female with medical history significant for SMA stenosis status post stent placement with interventional radiology presenting to the emergency department with a chief complaint of epigastric abdominal pain.  The patient has a fentanyl  patch in place for chronic pain.  She has frequent problems with constipation.  Her last bowel movement was several days ago.  She is not passing gas.  She states that she has had 6 out of 10 pain in the epigastrium.  It is associated with nausea.  She was drinking alcohol yesterday and questions whether this could have triggered some of her pain and discomfort.  She has had no vomiting.  She is primarily concerned that  she may have an issue with her stent given her history of stenosis.  On arrival, the patient was vitally stable, afebrile, tachycardic heart rate 104, BP 162/98, respiratory rate 18, saturating 99% on room air.  Patient presenting to the emergency department with epigastric abdominal pain.  Did start after consuming alcohol yesterday.  Considered alcoholic gastritis.  Considered mesenteric ischemia.  Considered bowel obstruction, peptic ulcer disease, constipation, other acute intra-abdominal emergency.  IV access was obtained the patient was administered IV fluid bolus, IV Dilaudid  and IV Zofran .  Laboratory evaluation obtained as well as CT angiogram.  Labs: Lactic acid normal, CBC without a leukocytosis or anemia, CMP unremarkable, lipase normal, urinalysis negative.  CT angio abdomen and pelvis:  IMPRESSION:  1. Diffuse aortoiliac atherosclerosis with moderate stenosis of both ostia of  the iliac arteries due to mixed density plaque at the iliac bifurcation, with  multifocal calcified and noncalcified atherosclerosis throughout the iliac  arteries bilaterally. No aortic aneurysm or aortic dissection.  2. The celiac artery is widely patent. Patent proximal sma stenting noted. No  acute thrombus or hemodynamically significant stenosis in the mesenteric  vessels. SABRA   Spoke with Dr. Johann of interventional radiology who agreed that stent appears to be patent, no indication for acute intervention at this time.  No acute abnormality seen on CTA of the abdomen and pelvis.  Patient had been drinking alcohol yesterday, considered gastritis versus peptic ulcer disease.  Patient was administered GI cocktail, Bentyl  as well as Pepcid .  On repeat assessment, the patient was tolerating p.o. intake and feeling significantly improved.  Suspect component of alcohol induced gastritis.  Also consider peptic ulcer disease.  Will have the patient follow-up with her PCP if she has persistent symptoms, could  benefit from referral for outpatient follow-up with GI.  Pepcid  prescribed at discharge.     Final diagnoses:  Acute gastritis, presence of bleeding unspecified, unspecified gastritis type  Epigastric pain    ED Discharge Orders          Ordered    famotidine  (PEPCID ) 20 MG tablet  2 times daily        08/26/24 1525               Jerrol Agent, MD 08/26/24 1525  "

## 2024-08-26 NOTE — Discharge Instructions (Addendum)
 Your workup in the emergency department to include laboratory evaluation and CT angiogram was overall reassuring.  Your symptoms could be due to gastritis.  If you had develop persistent constipation symptoms and abdominal pain, you can try a bowel regimen with 4 capfuls of MiraLAX and a 32 ounce Gatorade, drink over 4 hours and repeat the following day for satisfactory bowel movement.  Continue to take your over-the-counter Prilosec and Pepcid  has been prescribed as well.  Follow-up with your PCP regarding your ER visit today.

## 2024-08-26 NOTE — Progress Notes (Signed)
 CTA reviewed with Dr. Vanice, SMA stent is widely patent and there is no imaging features can explain patient's abdominal pain and nausea.   Dr. Luverne was informed, ok to not scheduled the patient for angioplasty and possible placement of a second overlapping SMA stent.   Patient seen in ED, laying in bed NAD, husband at bedside.  She was informed about the CTA result and her stent and other arteries supplying her gut look good, but it is unclear where her abdominal pain is coming from. Recommended her to reach out to her GI doctor for further evaluation.   Patient was very pleased about the CTA result and she does not need additional procedure.  All questions answered, patient and her husband were encourage to call IR for questions and concerns.   Please call IR for questions and concerns.   Christina Hurlbutt H Jamas Jaquay PA-C 08/26/2024 3:48 PM

## 2024-08-26 NOTE — ED Triage Notes (Addendum)
 Pt. Stated, I was watching a movie and continuous felt like I had to go to bathroom, started having abdominal pain.Ive also had some nausea. . I have SMA is 80 % narrowed. I see Dr. Luverne. Now I have 75mcg Fentanyl  patch.

## 2024-09-05 ENCOUNTER — Telehealth: Payer: Self-pay | Admitting: Cardiology

## 2024-09-05 NOTE — Telephone Encounter (Signed)
 Patient wants a provider switch from Dr. Edwyna in [place] to Dr. Floretta in [place].

## 2024-09-17 DIAGNOSIS — E7841 Elevated Lipoprotein(a): Secondary | ICD-10-CM | POA: Insufficient documentation

## 2024-09-17 NOTE — Progress Notes (Signed)
 " Cardiology Office Note:  .   Date:  09/17/2024  ID:  Christina Cantrell, DOB [birth year], MRN [id number] PCP: Sherre Clapper, MD  [facility] Providers Cardiologist:  Georganna Archer, MD { Chief Complaint:  Chief Complaint  Patient presents with   Heart Problem    History of Present Illness: .    Christina Cantrell is a 64 y.o. 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  Discussed the use of AI scribe software for clinical note transcription with the patient, who gave verbal consent to proceed.  History of Present Illness Christina Cantrell is a 64 year old female with coronary artery disease and peripheral arterial disease who presents for cardiovascular evaluation.  She has a history of coronary artery disease and peripheral arterial disease, with a stent placed in her superior mesenteric artery. She experienced a scare at Christmas where she developed abdominal pain and was concerned that there was an issue with her stent.  CT abdominal imaging confirmed stent patency but this showed extensive PAD.  She experiences chest pain that is not exacerbated by exertion and can occur at rest. She has used nitroglycerin  two to three times over the past three to four years, initially causing headaches but better tolerated subsequently. She is not currently on any blood pressure medication but reports high blood pressure readings at home, with a recent reading of 160/110-115 mmHg.  She has a history of multiple sclerosis with symptoms including cognitive difficulties, right-sided foot drop, and visual disturbances on the right side. She experiences severe neuropathic pain described as 'like sitting in an electric chair,' managed with a fentanyl  patch for 25 years. She also reports a new symptom of 'lightning' pain running up her spine to her head, which started in the last  year.  She has tried several cholesterol medications in the past, including PCSK9 inhibitors, Zetia , Nexletol , and various statins, but experienced side effects. She is currently on rosuvastatin  20 mg, which she tolerates well, although her LDL remains at 80 mg/dL.  Her social history includes a past history of smoking, which she quit five years ago. She has been married to her husband, Octaviano, for 40 years.   Studies Reviewed: SABRA    EKG: No new ECG       Cardiac Studies & Procedures   ______________________________________________________________________________________________     ECHOCARDIOGRAM  ECHOCARDIOGRAM COMPLETE 03/28/2022  Narrative ECHOCARDIOGRAM REPORT    Patient Name:   Christina Cantrell Date of Exam: 03/28/2022 Medical Rec #:  [id number]        Height:       64.0 in Accession #:    [id number]       Weight:       137.2 lb Date of Birth:  06-13-1961        BSA:          1.667 m Patient Age:    61 years         BP:           148/86 mmHg Patient Gender: F                HR:           95 bpm. Exam Location:  [place]  Procedure: 2D Echo, Cardiac Doppler, Color Doppler and Strain Analysis  Indications:    Superior mesenteric artery stenosis (HCC) [K55.1 (ICD-10-CM)]; Angina pectoris (HCC) [I20.9 (  ICD-10-CM)]  History:        Patient has no prior history of Echocardiogram examinations. Signs/Symptoms:Chest Pain.  Sonographer:    Charlie Jointer RDCS Referring Phys: CYRUS JENNIFER SAUNDERS [facility]   Sonographer Comments: Image acquisition challenging due to respiratory motion. IMPRESSIONS   1. Left ventricular ejection fraction, by estimation, is 60 to 65%. The left ventricle has normal function. The left ventricle has no regional wall motion abnormalities. Left ventricular diastolic parameters were normal. 2. Right ventricular systolic function is normal. The right ventricular size is normal. 3. The mitral valve is normal in structure. Mild mitral valve regurgitation. No  evidence of mitral stenosis. 4. The aortic valve is normal in structure. Aortic valve regurgitation is not visualized. No aortic stenosis is present. 5. The inferior vena cava is normal in size with greater than 50% respiratory variability, suggesting right atrial pressure of 3 mmHg.  FINDINGS Left Ventricle: Left ventricular ejection fraction, by estimation, is 60 to 65%. The left ventricle has normal function. The left ventricle has no regional wall motion abnormalities. The left ventricular internal cavity size was normal in size. There is no left ventricular hypertrophy. Left ventricular diastolic parameters were normal.  Right Ventricle: The right ventricular size is normal. No increase in right ventricular wall thickness. Right ventricular systolic function is normal.  Left Atrium: Left atrial size was normal in size.  Right Atrium: Right atrial size was normal in size.  Pericardium: There is no evidence of pericardial effusion.  Mitral Valve: The mitral valve is normal in structure. Mild mitral valve regurgitation. No evidence of mitral valve stenosis.  Tricuspid Valve: The tricuspid valve is normal in structure. Tricuspid valve regurgitation is not demonstrated. No evidence of tricuspid stenosis.  Aortic Valve: The aortic valve is normal in structure. Aortic valve regurgitation is not visualized. No aortic stenosis is present.  Pulmonic Valve: The pulmonic valve was normal in structure. Pulmonic valve regurgitation is not visualized. No evidence of pulmonic stenosis.  Aorta: The aortic root is normal in size and structure.  Venous: The inferior vena cava is normal in size with greater than 50% respiratory variability, suggesting right atrial pressure of 3 mmHg.  IAS/Shunts: No atrial level shunt detected by color flow Doppler.   LEFT VENTRICLE PLAX 2D LVIDd:         3.80 cm Diastology LVIDs:         3.20 cm LV e' medial:    7.07 cm/s LV PW:         0.80 cm LV E/e' medial:   8.3 LV IVS:        1.10 cm LV e' lateral:   8.16 cm/s LV E/e' lateral: 7.2   RIGHT VENTRICLE             IVC RV Basal diam:  2.80 cm     IVC diam: 1.00 cm RV S prime:     16.40 cm/s TAPSE (M-mode): 2.3 cm  LEFT ATRIUM             Index        RIGHT ATRIUM          Index LA diam:        1.40 cm 0.84 cm/m   RA Area:     9.44 cm LA Vol (A2C):   21.1 ml 12.66 ml/m  RA Volume:   20.30 ml 12.18 ml/m LA Vol (A4C):   13.0 ml 7.80 ml/m LA Biplane Vol: 18.2 ml 10.92 ml/m AORTIC VALVE LVOT Vmax:  116.00 cm/s LVOT Vmean:  82.200 cm/s LVOT VTI:    0.255 m  AORTA Ao Root diam: 3.30 cm Ao Asc diam:  2.60 cm Ao Desc diam: 1.50 cm  MITRAL VALVE MV Area (PHT): 3.27 cm    SHUNTS MV Decel Time: 232 msec    Systemic VTI: 0.26 m MV E velocity: 58.70 cm/s MV A velocity: 62.10 cm/s MV E/A ratio:  0.95  Lamar Fitch MD Electronically signed by Lamar Fitch MD Signature Date/Time: 03/28/2022/2:27:03 PM    Final      CT SCANS  CT CORONARY MORPH W/CTA COR W/SCORE 04/04/2022  Addendum 04/05/2022 12:40 PM ADDENDUM REPORT: 04/05/2022 12:37  CLINICAL DATA:  cp  EXAM: Cardiac/Coronary  CTA  TECHNIQUE: The patient was scanned on a [employer].  FINDINGS: A 120 kV prospective scan was triggered in the descending thoracic aorta at 111 HU's. Axial non-contrast 3 mm slices were carried out through the heart. The data set was analyzed on a dedicated work station and scored using the Agatson method. Gantry rotation speed was 250 msecs and collimation was .6 mm. No beta blockade and 0.8 mg of sl NTG was given. The 3D data set was reconstructed in 5% intervals of the 67-82 % of the R-R cycle. Diastolic phases were analyzed on a dedicated work station using MPR, MIP and VRT modes. The patient received 80 cc of contrast.  Aorta: Normal size. Minimal calcifications of the descending thoracic aorta. No dissection.  Aortic Valve:  Trileaflet.  No  calcifications.  Coronary Arteries:  Normal coronary origin.  Right dominance.  RCA is a large dominant artery that gives rise to PDA and PLA. In the proximal and mid portion of this artery there are few calcified plaques with worst stenosis in the mid portion of RCA with mild stenosis of 25-49%.  Left main is a short large artery that gives rise to LAD and LCX arteries as well as moderate size Intermediate branch.  LAD is a large vessel that has small plaques with minimal stenosis of 0-25% in proximal and mid portion. This artery gives rise to small D1, mod D2, small D3 and large D4. D4 and distal LAD is very tortuous.  LCX is a non-dominant artery that gives rise to one small OM1 and large OM2 branch. There is small plaque with minimal stenosis (0-25%) in the proximal potion of CX.  Other findings:  Normal pulmonary vein drainage into the left atrium.  Normal left atrial appendage without a thrombus.  Normal size of the pulmonary artery.  IMPRESSION: 1. Coronary calcium  score of 89.1. This was 90 percentile for age and sex matched control.  2. Normal coronary origin with right dominance.  3. CAD-RADS 2. Mild non-obstructive CAD (25-49%) (worst stenosis - RCA 25-49%). Consider non-atherosclerotic causes of chest pain. Consider preventive therapy and risk factor modification.   Electronically Signed By: Lamar Fitch M.D. On: 04/05/2022 12:37  Narrative EXAM: OVER-READ INTERPRETATION  CT CHEST  The following report is an over-read performed by radiologist Dr. Elsie Perone of [facility] on 04/04/2022. This over-read does not include interpretation of cardiac or coronary anatomy or pathology. The coronary CTA interpretation by the cardiologist is attached.  COMPARISON:  Chest CT 02/03/2022.  FINDINGS: Mediastinum/Nodes: There are no enlarged lymph nodes within the visualized mediastinum.  Lungs/Pleura: There is no pleural effusion. Stable  calcified left lower lobe granuloma. The lungs are otherwise clear. Mild emphysema and chronic central airway thickening.  Upper abdomen: No significant findings in  the visualized upper abdomen.  Musculoskeletal/Chest wall: No chest wall mass or suspicious osseous findings within the visualized chest.  IMPRESSION: No significant extracardiac findings within the visualized lower chest. Mild Emphysema (ICD10-J43.9).  Electronically Signed: By: Elsie Perone M.D. On: 04/04/2022 17:01     ______________________________________________________________________________________________      Results Labs LDL: 80 Lipoprotein(a): Elevated Potassium: Within normal limits  Radiology CT abdomen and pelvis (08/2024): Aortoiliac arterial occlusive disease  Risk Assessment/Calculations:           Physical Exam:    VS:  BP (!) 158/80 (BP Location: Right Arm, Patient Position: Sitting, Cuff Size: Normal)   Pulse 91   Ht 5' 4 (1.626 m)   Wt 128 lb 1.6 oz (58.1 kg)   SpO2 96%   BMI 21.99 kg/m      Wt Readings from Last 3 Encounters:  08/26/24 122 lb (55.3 kg)  07/16/24 127 lb (57.6 kg)  06/03/24 125 lb 4.8 oz (56.8 kg)     GEN: Well nourished, well developed, in no acute distress NECK: No JVD; No carotid bruits CARDIAC: RRR, no murmurs, rubs, gallops RESPIRATORY:  Clear to auscultation without rales, wheezing or rhonchi  ABDOMEN: Soft, non-tender, non-distended, normal bowel sounds EXTREMITIES:  Warm and well perfused, no edema; No deformity, 2+ radial pulses PSYCH: Normal mood and affect   ASSESSMENT AND PLAN: .    #HLD #Elevated LPA - Has been tolerant to statins, PCSK9, Zetia  and bempedoic acid  in the past, but the patient has extensive atherosclerosis which warrants tight lipid control. -The patient is currently on Crestor  20 mg daily and says that this has been tolerable to her. -Will plan to increase her Crestor  to 40 mg daily to get her LDL to <55 at  least. -If she is intolerant to higher doses of Crestor  then inclisiran will be her last option. Increase rosuvastatin  to 40 mg daily Consider Pharm.D. referral for inclisiran in the future if Crestor  is not enough Follow-up in 6 to 8 weeks  #Atypical Chest Pain #Non-Obstructive CAD - The patient reports having ongoing episodes of chest pain which are not clearly exertional and can happen sporadically. - CCTA in 2024 showed nonobstructive CAD. - Given her significant risk factors for obstructive CAD, we will repeat a CCTA for evaluation. CCTA Continue aspirin and Plavix  per vascular  #Extensive PAD #SMA Stenosis s/p Stent (2023) -Severe peripheral vascular disease with SMA stenosis that was stented by vascular in 2023. -Recommended DAPT. - I am wondering if low-dose Xarelto plus aspirin would be superior to DAPT in this particular setting. -I will contact her vascular surgeon to inquire. Will discuss transitioning to aspirin plus low-dose Xarelto with vascular surgeon  #HTN - Not on anything for blood pressure and her blood pressure is consistently in the 150s per her report. -Will start medical therapy. Start losartan  50 mg daily Follow-up in 6 to 8 weeks        This note was written with the assistance of a dictation microphone or AI dictation software. Please excuse any typos or grammatical errors.   Signed, Georganna Archer, MD  09/17/2024 8:57 PM    [facility] "

## 2024-09-18 ENCOUNTER — Ambulatory Visit
Attending: Student in an Organized Health Care Education/Training Program | Admitting: Student in an Organized Health Care Education/Training Program

## 2024-09-18 ENCOUNTER — Other Ambulatory Visit (HOSPITAL_COMMUNITY): Payer: Self-pay

## 2024-09-18 VITALS — BP 158/80 | HR 91 | Ht 64.0 in | Wt 128.1 lb

## 2024-09-18 DIAGNOSIS — I10 Essential (primary) hypertension: Secondary | ICD-10-CM | POA: Diagnosis not present

## 2024-09-18 DIAGNOSIS — I2510 Atherosclerotic heart disease of native coronary artery without angina pectoris: Secondary | ICD-10-CM | POA: Diagnosis not present

## 2024-09-18 DIAGNOSIS — R072 Precordial pain: Secondary | ICD-10-CM | POA: Diagnosis not present

## 2024-09-18 DIAGNOSIS — E7841 Elevated Lipoprotein(a): Secondary | ICD-10-CM | POA: Diagnosis not present

## 2024-09-18 DIAGNOSIS — E782 Mixed hyperlipidemia: Secondary | ICD-10-CM

## 2024-09-18 MED ORDER — LOSARTAN POTASSIUM 50 MG PO TABS
50.0000 mg | ORAL_TABLET | Freq: Every day | ORAL | 3 refills | Status: DC
  Filled 2024-09-18: qty 90, 90d supply, fill #0

## 2024-09-18 MED ORDER — METOPROLOL TARTRATE 100 MG PO TABS
100.0000 mg | ORAL_TABLET | Freq: Once | ORAL | 0 refills | Status: AC
  Filled 2024-09-18: qty 1, 1d supply, fill #0

## 2024-09-18 MED ORDER — ROSUVASTATIN CALCIUM 40 MG PO TABS
40.0000 mg | ORAL_TABLET | Freq: Every day | ORAL | 3 refills | Status: AC
  Filled 2024-09-18: qty 90, 90d supply, fill #0

## 2024-09-18 NOTE — Patient Instructions (Addendum)
 Medication Instructions:  INCREASE Crestor  to 40 mg   START Losartan  50 mg daily   ON DAY OF CT:  metoprolol  tartrate (LOPRESSOR ) 100 MG tablet         Take 1 tablet (100 mg total) by mouth once. Take 90-120 minutes prior to scan. Hold for SBP less than 110.   *If you need a refill on your cardiac medications before your next appointment, please call your pharmacy*  Testing/Procedures: Coronary CTA  Your physician has requested that you have cardiac CT. Cardiac computed tomography (CT) is a painless test that uses an x-ray machine to take clear, detailed pictures of your heart. For further information please visit [email or web address] Please follow instruction sheet as given.    Your cardiac CT will be scheduled at one of the below locations:   [facility] [address]  [place], [place] [address]  If scheduled at the [facility] at [address], please enter the parking lot using the [address] entrance and use the FREE valet service at the patient drop-off area. Enter the building and check-in with registration on the main floor.  Please follow these instructions carefully (unless otherwise directed):  An IV will be required for this test and Nitroglycerin  will be given.  Hold all erectile dysfunction medications at least 3 days (72 hrs) prior to test. (Ie viagra, cialis, sildenafil, tadalafil, etc)   On the Night Before the Test: Be sure to Drink plenty of water. Do not consume any caffeinated/decaffeinated beverages or chocolate 12 hours prior to your test. Do not take any antihistamines 12 hours prior to your test.  On the Day of the Test: Drink plenty of water until 1 hour prior to the test. Do not eat any food 1 hour prior to test. You may take your regular medications prior to the test.  Take metoprolol  (Lopressor ) two hours prior to test. If you take Furosemide/Hydrochlorothiazide/Spironolactone/Chlorthalidone, please  HOLD on the morning of the test. Patients who wear a continuous glucose monitor MUST remove the device prior to scanning. FEMALES- please wear underwire-free bra if available, avoid dresses & tight clothing      After the Test: Drink plenty of water. After receiving IV contrast, you may experience a mild flushed feeling. This is normal. On occasion, you may experience a mild rash up to 24 hours after the test. This is not dangerous. If this occurs, you can take Benadryl  25 mg, Zyrtec, Claritin, or Allegra and increase your fluid intake. (Patients taking Tikosyn should avoid Benadryl , and may take Zyrtec, Claritin, or Allegra) If you experience trouble breathing, this can be serious. If it is severe call 911 IMMEDIATELY. If it is mild, please call our office.  We will call to schedule your test 2-4 weeks out understanding that some insurance companies will need an authorization prior to the service being performed.   For more information and frequently asked questions, please visit our website : [email or web address]  For non-scheduling related questions, please contact the cardiac imaging nurse navigator should you have any questions/concerns: Cardiac Imaging Nurse Navigators Direct Office Dial: [phone]   For scheduling needs, including cancellations and rescheduling, please call Brittany, [phone].   Follow-Up: At [facility], you and your health needs are our priority.  As part of our continuing mission to provide you with exceptional heart care, our providers are all part of one team.  This team includes your primary Cardiologist (physician) and Advanced Practice Providers or APPs (Physician Assistants  and Nurse Practitioners) who all work together to provide you with the care you need, when you need it.  Your next appointment:   6-8 week(s)  Provider:   Georganna Archer, MD    We recommend signing up for the patient portal called MyChart.  Sign up  information is provided on this After Visit Summary.  MyChart is used to connect with patients for Virtual Visits (Telemedicine).  Patients are able to view lab/test results, encounter notes, upcoming appointments, etc.  Non-urgent messages can be sent to your provider as well.   To learn more about what you can do with MyChart, go to [email or web address].

## 2024-09-23 ENCOUNTER — Telehealth: Payer: Self-pay | Admitting: Student in an Organized Health Care Education/Training Program

## 2024-09-23 NOTE — Telephone Encounter (Signed)
 Pt c/o medication issue:  1. Name of Medication:   losartan  (COZAAR ) 50 MG tablet   2. How are you currently taking this medication (dosage and times per day)?   As prescribed  3. Are you having a reaction (difficulty breathing--STAT)?   4. What is your medication issue?   Patient called to report as requested this new medication is causing her BP to go too low.  Patient noted her BP reading today was 90/64.  Patient stated she thinks this medication is too strong for her and she may need to take 1/2 tablet.

## 2024-09-25 MED ORDER — LOSARTAN POTASSIUM 50 MG PO TABS: 25.0000 mg | ORAL_TABLET | Freq: Every day | ORAL | Status: AC

## 2024-09-25 NOTE — Telephone Encounter (Signed)
 Spoke to patient and she was advised to decrease the dose. Pt agrees with plan of care. Will monitor blood pressure for another week and will advise.

## 2024-10-03 ENCOUNTER — Encounter (HOSPITAL_COMMUNITY): Payer: Self-pay

## 2024-10-06 ENCOUNTER — Ambulatory Visit (HOSPITAL_COMMUNITY)
Admission: RE | Admit: 2024-10-06 | Discharge: 2024-10-06 | Disposition: A | Discharge status: Home / Self Care | Source: Ambulatory Visit | Attending: Cardiovascular Disease | Admitting: Cardiovascular Disease

## 2024-10-06 ENCOUNTER — Ambulatory Visit (HOSPITAL_COMMUNITY)
Admission: RE | Admit: 2024-10-06 | Discharge: 2024-10-06 | Disposition: A | Discharge status: Home / Self Care | Source: Ambulatory Visit | Attending: Cardiology | Admitting: Cardiology

## 2024-10-06 ENCOUNTER — Other Ambulatory Visit: Payer: Self-pay | Admitting: Cardiology

## 2024-10-06 DIAGNOSIS — R072 Precordial pain: Secondary | ICD-10-CM

## 2024-10-06 DIAGNOSIS — R931 Abnormal findings on diagnostic imaging of heart and coronary circulation: Secondary | ICD-10-CM

## 2024-10-06 MED ORDER — IOHEXOL 350 MG/ML SOLN
100.0000 mL | Freq: Once | INTRAVENOUS | Status: AC | PRN
  Administered 2024-10-06: 100 mL via INTRAVENOUS

## 2024-10-06 MED ORDER — NITROGLYCERIN 0.4 MG SL SUBL: 0.8000 mg | SUBLINGUAL_TABLET | Freq: Once | SUBLINGUAL | Status: DC

## 2024-10-07 ENCOUNTER — Telehealth: Payer: Self-pay | Admitting: Student in an Organized Health Care Education/Training Program

## 2024-10-07 ENCOUNTER — Ambulatory Visit: Payer: Self-pay | Admitting: Student in an Organized Health Care Education/Training Program

## 2024-10-07 DIAGNOSIS — E782 Mixed hyperlipidemia: Secondary | ICD-10-CM

## 2024-10-07 NOTE — Telephone Encounter (Addendum)
 The patient's coronary CTA came back as showing potentially obstructive CAD in the RCA.  I called the patient to discuss these results.  She is still having atypical chest pain with exertional dyspnea.  I explained the risk and benefits of a heart catheterization and we both agree that she should undergo heart catheterization for further evaluation.  Additionally, she reports having ongoing muscle aches at the higher dose of rosuvastatin .  I will refer her to Pharm.D. for consideration of Praluent vs inclisiran for lipid lowering therapy.  She is agreeable.  Informed Consent   Shared Decision Making/Informed Consent The risks [stroke (1 in 1000), death (1 in 1000), kidney failure [usually temporary] (1 in 500), bleeding (1 in 200), allergic reaction [possibly serious] (1 in 200)], benefits (diagnostic support and management of coronary artery disease) and alternatives of a cardiac catheterization were discussed in detail with Ms. Chevalier and she is willing to proceed.       Elih Mooney T. Floretta HEATH, MD [place]  [facility]  10/07/2024 5:51 PM

## 2024-10-10 ENCOUNTER — Ambulatory Visit

## 2024-10-14 ENCOUNTER — Ambulatory Visit: Admitting: Student in an Organized Health Care Education/Training Program

## 2024-11-11 ENCOUNTER — Ambulatory Visit: Admitting: Student in an Organized Health Care Education/Training Program

## 2024-11-14 ENCOUNTER — Ambulatory Visit: Admitting: Family Medicine

## 2024-11-25 ENCOUNTER — Ambulatory Visit: Admitting: Family Medicine

## 2025-04-24 ENCOUNTER — Ambulatory Visit

## 2025-06-04 ENCOUNTER — Ambulatory Visit
